# Patient Record
Sex: Female | Born: 1960
Health system: Southern US, Community
[De-identification: ages and names within clinical notes are randomized; demographics above are authoritative.]

## PROBLEM LIST (undated history)

## (undated) DIAGNOSIS — F419 Anxiety disorder, unspecified: Secondary | ICD-10-CM

## (undated) DIAGNOSIS — R109 Unspecified abdominal pain: Secondary | ICD-10-CM

## (undated) DIAGNOSIS — F329 Major depressive disorder, single episode, unspecified: Secondary | ICD-10-CM

## (undated) DIAGNOSIS — M1990 Unspecified osteoarthritis, unspecified site: Secondary | ICD-10-CM

## (undated) DIAGNOSIS — Z8616 Personal history of COVID-19: Secondary | ICD-10-CM

## (undated) DIAGNOSIS — F32A Depression, unspecified: Secondary | ICD-10-CM

## (undated) DIAGNOSIS — G2581 Restless legs syndrome: Secondary | ICD-10-CM

## (undated) DIAGNOSIS — Z973 Presence of spectacles and contact lenses: Secondary | ICD-10-CM

## (undated) DIAGNOSIS — G709 Myoneural disorder, unspecified: Secondary | ICD-10-CM

## (undated) DIAGNOSIS — D509 Iron deficiency anemia, unspecified: Secondary | ICD-10-CM

## (undated) DIAGNOSIS — D259 Leiomyoma of uterus, unspecified: Secondary | ICD-10-CM

## (undated) DIAGNOSIS — T7840XA Allergy, unspecified, initial encounter: Secondary | ICD-10-CM

## (undated) HISTORY — DX: Depression, unspecified: F32.A

## (undated) HISTORY — DX: Leiomyoma of uterus, unspecified: D25.9

## (undated) HISTORY — PX: TONSILLECTOMY AND ADENOIDECTOMY: SUR1326

## (undated) HISTORY — DX: Anxiety disorder, unspecified: F41.9

## (undated) HISTORY — DX: Myoneural disorder, unspecified: G70.9

## (undated) HISTORY — PX: TEAR DUCT PROBING: SHX793

## (undated) HISTORY — PX: HYSTEROSCOPY WITH D & C: SHX1775

## (undated) HISTORY — PX: LASER ABLATION OF THE CERVIX: SHX1949

## (undated) HISTORY — DX: Major depressive disorder, single episode, unspecified: F32.9

## (undated) HISTORY — PX: DILATION AND CURETTAGE OF UTERUS: SHX78

## (undated) HISTORY — PX: INGUINAL HERNIA REPAIR: SUR1180

## (undated) HISTORY — DX: Allergy, unspecified, initial encounter: T78.40XA

---
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
## 1986-05-27 HISTORY — PX: LASER ABLATION OF THE CERVIX: SHX1949

## 1998-01-04 ENCOUNTER — Other Ambulatory Visit: Admission: RE | Admit: 1998-01-04 | Discharge: 1998-01-04 | Payer: Self-pay | Admitting: Obstetrics and Gynecology

## 1999-05-14 ENCOUNTER — Other Ambulatory Visit: Admission: RE | Admit: 1999-05-14 | Discharge: 1999-05-14 | Payer: Self-pay | Admitting: Obstetrics and Gynecology

## 2000-07-15 ENCOUNTER — Other Ambulatory Visit: Admission: RE | Admit: 2000-07-15 | Discharge: 2000-07-15 | Payer: Self-pay | Admitting: Obstetrics and Gynecology

## 2001-09-29 ENCOUNTER — Other Ambulatory Visit: Admission: RE | Admit: 2001-09-29 | Discharge: 2001-09-29 | Payer: Self-pay | Admitting: Obstetrics and Gynecology

## 2001-09-29 ENCOUNTER — Ambulatory Visit (HOSPITAL_COMMUNITY): Admission: RE | Admit: 2001-09-29 | Discharge: 2001-09-29 | Payer: Self-pay | Admitting: Obstetrics and Gynecology

## 2001-09-29 ENCOUNTER — Encounter: Payer: Self-pay | Admitting: Obstetrics and Gynecology

## 2002-06-17 ENCOUNTER — Ambulatory Visit (HOSPITAL_COMMUNITY): Admission: RE | Admit: 2002-06-17 | Discharge: 2002-06-17 | Payer: Self-pay | Admitting: Obstetrics and Gynecology

## 2002-06-17 ENCOUNTER — Encounter (INDEPENDENT_AMBULATORY_CARE_PROVIDER_SITE_OTHER): Payer: Self-pay | Admitting: *Deleted

## 2003-02-08 ENCOUNTER — Other Ambulatory Visit: Admission: RE | Admit: 2003-02-08 | Discharge: 2003-02-08 | Payer: Self-pay | Admitting: Obstetrics and Gynecology

## 2003-08-25 ENCOUNTER — Ambulatory Visit (HOSPITAL_COMMUNITY): Admission: RE | Admit: 2003-08-25 | Discharge: 2003-08-25 | Payer: Self-pay | Admitting: Obstetrics and Gynecology

## 2004-11-08 ENCOUNTER — Ambulatory Visit (HOSPITAL_COMMUNITY): Admission: RE | Admit: 2004-11-08 | Discharge: 2004-11-08 | Payer: Self-pay | Admitting: Obstetrics and Gynecology

## 2005-12-12 ENCOUNTER — Encounter: Payer: Self-pay | Admitting: Family Medicine

## 2005-12-12 LAB — CONVERTED CEMR LAB
Albumin: 3.9 g/dL
Alkaline Phosphatase: 102 units/L
BUN: 13 mg/dL
Chloride: 103 meq/L
Cholesterol: 197 mg/dL
Creatinine, Ser: 0.9 mg/dL
Glucose, Bld: 89 mg/dL
Sodium: 143 meq/L
Total Bilirubin: 0.2 mg/dL
Total Protein: 6.7 g/dL

## 2006-01-28 ENCOUNTER — Ambulatory Visit (HOSPITAL_COMMUNITY): Admission: RE | Admit: 2006-01-28 | Discharge: 2006-01-28 | Payer: Self-pay | Admitting: Obstetrics and Gynecology

## 2007-03-12 ENCOUNTER — Encounter: Admission: RE | Admit: 2007-03-12 | Discharge: 2007-03-12 | Payer: Self-pay | Admitting: Obstetrics and Gynecology

## 2007-07-15 ENCOUNTER — Ambulatory Visit: Payer: Self-pay | Admitting: Family Medicine

## 2007-07-15 DIAGNOSIS — F411 Generalized anxiety disorder: Secondary | ICD-10-CM | POA: Insufficient documentation

## 2007-07-16 ENCOUNTER — Encounter: Payer: Self-pay | Admitting: Family Medicine

## 2007-07-16 LAB — CONVERTED CEMR LAB
ALT: 18 units/L (ref 0–35)
AST: 17 units/L (ref 0–37)
Albumin: 4.3 g/dL (ref 3.5–5.2)
Alkaline Phosphatase: 41 units/L (ref 39–117)
BUN: 20 mg/dL (ref 6–23)
Calcium: 9.2 mg/dL (ref 8.4–10.5)
Cholesterol: 187 mg/dL (ref 0–200)
Glucose, Bld: 102 mg/dL — ABNORMAL HIGH (ref 70–99)
HDL: 52 mg/dL (ref >39)
LDL Cholesterol: 112 mg/dL — ABNORMAL HIGH (ref 0–99)
Potassium: 4.4 meq/L (ref 3.5–5.3)
Total Bilirubin: 0.4 mg/dL (ref 0.3–1.2)
Total Protein: 6.9 g/dL (ref 6.0–8.3)
Triglycerides: 113 mg/dL (ref <150)

## 2007-07-17 ENCOUNTER — Encounter: Payer: Self-pay | Admitting: Family Medicine

## 2007-07-20 ENCOUNTER — Encounter: Payer: Self-pay | Admitting: Family Medicine

## 2007-09-10 ENCOUNTER — Encounter: Payer: Self-pay | Admitting: Family Medicine

## 2007-09-16 ENCOUNTER — Ambulatory Visit: Payer: Self-pay | Admitting: Family Medicine

## 2007-09-16 DIAGNOSIS — R7301 Impaired fasting glucose: Secondary | ICD-10-CM | POA: Insufficient documentation

## 2007-09-16 DIAGNOSIS — J309 Allergic rhinitis, unspecified: Secondary | ICD-10-CM | POA: Insufficient documentation

## 2007-09-16 LAB — CONVERTED CEMR LAB: Blood Glucose, Fasting: 100 mg/dL

## 2008-03-14 ENCOUNTER — Ambulatory Visit: Payer: Self-pay | Admitting: Family Medicine

## 2008-03-14 ENCOUNTER — Ambulatory Visit (HOSPITAL_COMMUNITY): Admission: RE | Admit: 2008-03-14 | Discharge: 2008-03-14 | Payer: Self-pay | Admitting: Obstetrics and Gynecology

## 2008-03-14 LAB — CONVERTED CEMR LAB: Blood Glucose, Fasting: 107 mg/dL

## 2008-07-21 ENCOUNTER — Telehealth: Payer: Self-pay | Admitting: Family Medicine

## 2008-08-17 ENCOUNTER — Ambulatory Visit: Payer: Self-pay | Admitting: Family Medicine

## 2008-08-17 DIAGNOSIS — F322 Major depressive disorder, single episode, severe without psychotic features: Secondary | ICD-10-CM | POA: Insufficient documentation

## 2008-08-17 DIAGNOSIS — F329 Major depressive disorder, single episode, unspecified: Secondary | ICD-10-CM | POA: Insufficient documentation

## 2008-08-17 DIAGNOSIS — G4700 Insomnia, unspecified: Secondary | ICD-10-CM | POA: Insufficient documentation

## 2008-09-13 ENCOUNTER — Telehealth: Payer: Self-pay | Admitting: Family Medicine

## 2008-10-12 ENCOUNTER — Ambulatory Visit (HOSPITAL_COMMUNITY): Payer: Self-pay | Admitting: Psychology

## 2008-11-02 ENCOUNTER — Ambulatory Visit (HOSPITAL_COMMUNITY): Payer: Self-pay | Admitting: Psychology

## 2008-11-16 ENCOUNTER — Ambulatory Visit (HOSPITAL_COMMUNITY): Payer: Self-pay | Admitting: Psychology

## 2008-11-23 ENCOUNTER — Ambulatory Visit (HOSPITAL_COMMUNITY): Payer: Self-pay | Admitting: Psychology

## 2008-11-29 ENCOUNTER — Ambulatory Visit (HOSPITAL_COMMUNITY): Payer: Self-pay | Admitting: Psychology

## 2008-12-08 ENCOUNTER — Ambulatory Visit (HOSPITAL_COMMUNITY): Payer: Self-pay | Admitting: Psychology

## 2008-12-21 ENCOUNTER — Ambulatory Visit (HOSPITAL_COMMUNITY): Payer: Self-pay | Admitting: Psychology

## 2009-01-04 ENCOUNTER — Ambulatory Visit (HOSPITAL_COMMUNITY): Payer: Self-pay | Admitting: Psychology

## 2009-01-12 ENCOUNTER — Telehealth (INDEPENDENT_AMBULATORY_CARE_PROVIDER_SITE_OTHER): Payer: Self-pay | Admitting: *Deleted

## 2009-01-18 ENCOUNTER — Ambulatory Visit: Payer: Self-pay | Admitting: Family Medicine

## 2009-01-18 ENCOUNTER — Ambulatory Visit (HOSPITAL_COMMUNITY): Payer: Self-pay | Admitting: Psychology

## 2009-02-08 ENCOUNTER — Encounter: Payer: Self-pay | Admitting: Family Medicine

## 2009-02-08 ENCOUNTER — Telehealth: Payer: Self-pay | Admitting: Family Medicine

## 2009-02-08 ENCOUNTER — Ambulatory Visit (HOSPITAL_COMMUNITY): Payer: Self-pay | Admitting: Psychology

## 2009-02-08 LAB — CONVERTED CEMR LAB
Creatinine, Ser: 0.9 mg/dL (ref 0.40–1.20)
HDL: 56 mg/dL (ref >39)
LDL Cholesterol: 121 mg/dL — ABNORMAL HIGH (ref 0–99)
TSH: 1.427 microintl units/mL (ref 0.350–4.500)
Total CHOL/HDL Ratio: 3.5
Triglycerides: 88 mg/dL (ref <150)

## 2009-02-13 ENCOUNTER — Telehealth: Payer: Self-pay | Admitting: Family Medicine

## 2009-03-01 ENCOUNTER — Ambulatory Visit (HOSPITAL_COMMUNITY): Payer: Self-pay | Admitting: Psychology

## 2009-03-15 ENCOUNTER — Ambulatory Visit (HOSPITAL_COMMUNITY): Payer: Self-pay | Admitting: Psychology

## 2009-03-22 ENCOUNTER — Ambulatory Visit: Payer: Self-pay | Admitting: Family Medicine

## 2009-03-29 ENCOUNTER — Ambulatory Visit (HOSPITAL_COMMUNITY): Payer: Self-pay | Admitting: Psychology

## 2009-04-12 ENCOUNTER — Ambulatory Visit (HOSPITAL_COMMUNITY): Admission: RE | Admit: 2009-04-12 | Discharge: 2009-04-12 | Payer: Self-pay | Admitting: Obstetrics and Gynecology

## 2009-04-26 ENCOUNTER — Ambulatory Visit (HOSPITAL_COMMUNITY): Payer: Self-pay | Admitting: Psychology

## 2009-05-24 ENCOUNTER — Ambulatory Visit (HOSPITAL_COMMUNITY): Payer: Self-pay | Admitting: Psychology

## 2009-06-21 ENCOUNTER — Ambulatory Visit (HOSPITAL_COMMUNITY): Payer: Self-pay | Admitting: Psychology

## 2009-07-12 ENCOUNTER — Ambulatory Visit (HOSPITAL_COMMUNITY): Payer: Self-pay | Admitting: Psychology

## 2009-08-02 ENCOUNTER — Ambulatory Visit (HOSPITAL_COMMUNITY): Payer: Self-pay | Admitting: Psychology

## 2009-08-23 ENCOUNTER — Ambulatory Visit: Payer: Self-pay | Admitting: Family Medicine

## 2009-08-23 ENCOUNTER — Ambulatory Visit (HOSPITAL_COMMUNITY): Payer: Self-pay | Admitting: Psychology

## 2009-09-20 ENCOUNTER — Ambulatory Visit (HOSPITAL_COMMUNITY): Payer: Self-pay | Admitting: Psychology

## 2009-11-15 ENCOUNTER — Ambulatory Visit (HOSPITAL_COMMUNITY): Payer: Self-pay | Admitting: Psychology

## 2009-12-06 ENCOUNTER — Ambulatory Visit (HOSPITAL_COMMUNITY): Payer: Self-pay | Admitting: Psychology

## 2009-12-13 ENCOUNTER — Ambulatory Visit: Payer: Self-pay | Admitting: Family Medicine

## 2009-12-13 DIAGNOSIS — R209 Unspecified disturbances of skin sensation: Secondary | ICD-10-CM | POA: Insufficient documentation

## 2009-12-13 DIAGNOSIS — R5381 Other malaise: Secondary | ICD-10-CM | POA: Insufficient documentation

## 2009-12-13 DIAGNOSIS — R5383 Other fatigue: Secondary | ICD-10-CM

## 2009-12-15 LAB — CONVERTED CEMR LAB
Basophils Absolute: 0 10*3/uL (ref 0.0–0.1)
CRP: 0.2 mg/dL (ref <0.6)
Eosinophils Absolute: 0.1 10*3/uL (ref 0.0–0.7)
Eosinophils Relative: 2 % (ref 0–5)
Folate: >20 ng/mL
HCT: 43.8 % (ref 36.0–46.0)
Lymphocytes Relative: 39 % (ref 12–46)
MCHC: 31.7 g/dL (ref 30.0–36.0)
MCV: 93.8 fL (ref 78.0–100.0)
Platelets: 238 10*3/uL (ref 150–400)
Sed Rate: 5 mm/hr (ref 0–22)
WBC: 5.4 10*3/uL (ref 4.0–10.5)

## 2009-12-23 ENCOUNTER — Encounter: Admission: RE | Admit: 2009-12-23 | Discharge: 2009-12-23 | Payer: Self-pay | Admitting: Family Medicine

## 2010-01-11 ENCOUNTER — Encounter: Payer: Self-pay | Admitting: Family Medicine

## 2010-01-19 ENCOUNTER — Encounter: Admission: RE | Admit: 2010-01-19 | Discharge: 2010-01-19 | Payer: Self-pay | Admitting: Neurology

## 2010-01-22 ENCOUNTER — Encounter: Payer: Self-pay | Admitting: Family Medicine

## 2010-02-14 ENCOUNTER — Ambulatory Visit (HOSPITAL_COMMUNITY): Payer: Self-pay | Admitting: Psychology

## 2010-02-16 ENCOUNTER — Encounter: Payer: Self-pay | Admitting: Family Medicine

## 2010-04-04 ENCOUNTER — Ambulatory Visit: Payer: Self-pay | Admitting: Family Medicine

## 2010-04-04 ENCOUNTER — Ambulatory Visit (HOSPITAL_COMMUNITY): Payer: Self-pay | Admitting: Psychology

## 2010-04-04 DIAGNOSIS — E559 Vitamin D deficiency, unspecified: Secondary | ICD-10-CM | POA: Insufficient documentation

## 2010-04-06 LAB — CONVERTED CEMR LAB
AST: 18 units/L (ref 0–37)
Alkaline Phosphatase: 43 units/L (ref 39–117)
Calcium: 9.5 mg/dL (ref 8.4–10.5)
Cholesterol: 196 mg/dL (ref 0–200)
Creatinine, Ser: 0.95 mg/dL (ref 0.40–1.20)
Glucose, Bld: 96 mg/dL (ref 70–99)
HDL: 49 mg/dL (ref >39)
LDL Cholesterol: 117 mg/dL — ABNORMAL HIGH (ref 0–99)
Sodium: 142 meq/L (ref 135–145)
Total Protein: 6.9 g/dL (ref 6.0–8.3)
VLDL: 30 mg/dL (ref 0–40)

## 2010-04-13 ENCOUNTER — Encounter: Payer: Self-pay | Admitting: Family Medicine

## 2010-05-02 ENCOUNTER — Ambulatory Visit (HOSPITAL_COMMUNITY)
Admission: RE | Admit: 2010-05-02 | Discharge: 2010-05-02 | Payer: Self-pay | Source: Home / Self Care | Attending: Obstetrics and Gynecology | Admitting: Obstetrics and Gynecology

## 2010-06-26 NOTE — Letter (Signed)
Summary: [facility]  [facility]   Imported By: Lanelle Bal 02/26/2010 10:53:06  _____________________________________________________________________  External Attachment:    Type:   Image     Comment:   External Document

## 2010-06-26 NOTE — Assessment & Plan Note (Signed)
Summary: CPE w/o pap   Vital Signs:  Patient profile:   50 year old female Height:      64 inches Weight:      176 pounds BMI:     30.32 O2 Sat:      97 % on Room air Pulse rate:   68 / minute BP sitting:   128 / 82  (left arm) Cuff size:   large  Vitals Entered By: Payton Spark CMA (April 04, 2010 9:28 AM)  O2 Flow:  Room air CC: CPE w/ fasting labs   Primary Care Provider:  Seymour Bars DO  CC:  CPE w/ fasting labs.  History of Present Illness: 50 yo WF presents for CPE w/o pap smear.    She is seeing Dr Gaetano Net for her neck and poor sleep.  She was neg for RLS and OSA but he is treating her for RLS.  On gabapentin now and it has helped her sleep.  She sees Dr Rosalio Macadamia for her pap smears.  She is on Loestrin and had borderline HTN at her gyn visit.  On OCPs continuously and she is not having a period.  Her mammogram is scheduled in Dec.    Her tetanus is due in 2014.  Got flu shot today. No fam hx of colon cancer. 2007 had a neg cardiolite stress test.  Due for fasting labs today. Moood/ anxiety has improved with Effexor and Gabapentin.     Current Medications (verified): 1)  Loestrin 1/20 (21) 1-20 Mg-Mcg  Tabs (Norethindrone Acet-Ethinyl Est) .... Take 1 Tablet By Mouth Once A Day 2)  Omeprazole 20 Mg  Tbec (Omeprazole) .... Take One By Mouth Every Other Day 3)  Caltrate 600+d 600-400 Mg-Unit  Tabs (Calcium Carbonate-Vitamin D) .... Take 1 Tablet By Mouth Once A Day 4)  Vitamin E 400 Unit  Caps (Vitamin E) .... Take 1 Tablet By Mouth Once A Day 5)  Ascorbic Acid 500 Mg  Tabs (Ascorbic Acid) .... Take 1 Tablet By Mouth Once A Day 6)  Gnp Glucosamine Sulfate 750 Mg  Tabs (Glucosamine Sulfate) .... Take 1 Tablet By Mouth Once A Day 7)  Adult Aspirin Low Strength 81 Mg  Tbdp (Aspirin) .... Take 1 Tablet By Mouth Once A Day 8)  Effexor Xr 150 Mg Xr24h-Cap (Venlafaxine Hcl) .Marland Kitchen.. 1 Tab By Mouth Once Daily 9)  Alprazolam 0.25 Mg Tabs (Alprazolam) .Marland Kitchen.. 1 Tab By Mouth  Two Times A Day As Needed Anxiety 10)  Estradiol 1 Mg Tabs (Estradiol) .... Take 1 Tablet By Mouth Once A Day While Off Lo-Estrin 11)  Qc Womens Daily Multivitamin  Tabs (Multiple Vitamins-Minerals) .... Take 1 Tablet By Mouth Once A Day 12)  Lutein 8 Mg Tabs (Lutein) .... Take 1 Tablet By Mouth Once A Day 13)  Loratadine 10 Mg Tabs (Loratadine) .... Take 1 Tab By Mouth Once Daily 14)  Feosol 45 Mg Tabs (Carbonyl Iron) 15)  Gabapentin 300 Mg Caps (Gabapentin) .... Take 1 Cap Every Morning and 3 Caps Every Evening  Allergies (verified): 1)  ! Prozac 2)  ! * Robitussin Dm  Past History:  Past Medical History: Reviewed history from 08/23/2009 and no changes required. GERD Anxiety G0 irreg menses heart murmur  2D echo normal 12-07 with neg cardiolyte stress cards: Dr Tresa Endo gyn: Dr Rosalio Macadamia  Past Surgical History: Reviewed history from 07/15/2007 and no changes required. bilat inguinal hernia repair 1964 Tear duct probe 1968 Conization of cervix 1989 D&C 1997, 2001  Family  History: Reviewed history from 09/16/2007 and no changes required. mother alive, uterine cancer at 23. father alive, AMI/CABG in 60's, Type II DM 3 brothers with HTN, high chol, depression sister asthma  Social History: Reviewed history from 07/15/2007 and no changes required. 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   No kids. 2 dogs, 2 cats. Never smoked. occas ETOH.  No regular exercise.  Review of Systems       The patient complains of weight gain.  The patient denies anorexia, fever, weight loss, vision loss, decreased hearing, hoarseness, chest pain, syncope, dyspnea on exertion, peripheral edema, prolonged cough, headaches, hemoptysis, abdominal pain, melena, hematochezia, severe indigestion/heartburn, hematuria, incontinence, genital sores, muscle weakness, suspicious skin lesions, transient blindness, difficulty walking, depression, unusual weight change,  abnormal bleeding, enlarged lymph nodes, angioedema, breast masses, and testicular masses.    Physical Exam  General:  alert, well-developed, well-nourished, well-hydrated, and overweight-appearing.   Head:  normocephalic and atraumatic.   Eyes:  wears glasses Ears:  EACs patent; TMs translucent and gray with good cone of light and bony landmarks.  Nose:  no nasal discharge.   Mouth:  good dentition and pharynx pink and moist.   Neck:  no masses.   Lungs:  Normal respiratory effort, chest expands symmetrically. Lungs are clear to auscultation, no crackles or wheezes. Heart:  Normal rate and regular rhythm. S1 and S2 normal without gallop, murmur, click, rub or other extra sounds. Abdomen:  Bowel sounds positive,abdomen soft and non-tender without masses, organomegaly, no AA bruits Pulses:  2+ radial and pedal pulses Extremities:  no LE edema Neurologic:  +3/4 patellar reflexes (hyperreflexic) Skin:  color normal.   Cervical Nodes:  No lymphadenopathy noted Psych:  good eye contact, not anxious appearing, and not depressed appearing.     Impression & Recommendations:  Problem # 1:  HEALTH MAINTENANCE EXAM (ICD-V70.0) Keeping healthy checklist for women. BP at goal.  BMI 30 c/w class I obesity. Mammogram and pap UTD per Dr Rosalio Macadamia. Counseled on healthy diet, regular exercise, wt loss. See Dr Gaetano Net for neurologic complaints. Update fasting labs and flu shot today. RTC mood in 4 mos.  Complete Medication List: 1)  Loestrin 1/20 (21) 1-20 Mg-mcg Tabs (Norethindrone acet-ethinyl est) .... Take 1 tablet by mouth once a day 2)  Omeprazole 20 Mg Tbec (Omeprazole) .... Take one by mouth every other day 3)  Caltrate 600+d 600-400 Mg-unit Tabs (Calcium carbonate-vitamin d) .... Take 1 tablet by mouth once a day 4)  Ascorbic Acid 500 Mg Tabs (Ascorbic acid) .... Take 1 tablet by mouth once a day 5)  Gnp Glucosamine Sulfate 750 Mg Tabs (Glucosamine sulfate) .... Take 1 tablet by mouth  once a day 6)  Adult Aspirin Low Strength 81 Mg Tbdp (Aspirin) .... Take 1 tablet by mouth once a day 7)  Effexor Xr 150 Mg Xr24h-cap (Venlafaxine hcl) .Marland Kitchen.. 1 tab by mouth once daily 8)  Alprazolam 0.25 Mg Tabs (Alprazolam) .Marland Kitchen.. 1 tab by mouth two times a day as needed anxiety 9)  Estradiol 1 Mg Tabs (Estradiol) .... Take 1 tablet by mouth once a day while off lo-estrin 10)  Qc Womens Daily Multivitamin Tabs (Multiple vitamins-minerals) .... Take 1 tablet by mouth once a day 11)  Lutein 8 Mg Tabs (Lutein) .... Take 1 tablet by mouth once a day 12)  Loratadine 10 Mg Tabs (Loratadine) .... Take 1 tab by mouth once daily 13)  Feosol 45 Mg Tabs (Carbonyl iron) 14)  Gabapentin 300 Mg Caps (Gabapentin) .... Take 1 cap every morning and 3 caps every evening  Other Orders: Admin 1st Vaccine (16109) Flu Vaccine 65yrs + [phone]) T-Vitamin D (25-Hydroxy) [phone]) T-Comprehensive Metabolic Panel [phone]) T-Lipid Profile [phone])   Orders Added: 1)  Admin 1st Vaccine [90471] 2)  Flu Vaccine 37yrs + [28413] 3)  T-Vitamin D (25-Hydroxy) [24401-02725] 4)  T-Comprehensive Metabolic Panel [[id number]] 5)  T-Lipid Profile [[id number]] 6)  Est. Patient age 44-64 [52] Flu Vaccine Consent Questions     Do you have a history of severe allergic reactions to this vaccine? no    Any prior history of allergic reactions to egg and/or gelatin? no    Do you have a sensitivity to the preservative Thimersol? no    Do you have a past history of Guillan-Barre Syndrome? no    Do you currently have an acute febrile illness? no    Have you ever had a severe reaction to latex? no    Vaccine information given and explained to patient? yes    Are you currently pregnant? no    Lot Number:[id number]   Exp Date:11/24/2010   Site Given  Left Deltoid IM Added: 1)  Admin 1st Vaccine [90471] 2)  Flu Vaccine 65yrs + [36644]     .lbflu

## 2010-06-26 NOTE — Consult Note (Signed)
Summary: [facility]  [facility]   Imported By: Lanelle Bal 01/17/2010 10:47:26  _____________________________________________________________________  External Attachment:    Type:   Image     Comment:   External Document

## 2010-06-26 NOTE — Assessment & Plan Note (Signed)
Summary: paresthesias/ fatigue   Vital Signs:  Patient profile:   50 year old female Height:      64 inches Weight:      173 pounds BMI:     29.80 O2 Sat:      98 % on Room air Pulse rate:   70 / minute BP sitting:   140 / 94  (left arm) Cuff size:   large  Vitals Entered By: Payton Spark CMA (December 13, 2009 10:46 AM)  O2 Flow:  Room air CC: Feels skin crawling x 1 month. Also c/o major fatigue x 1 month.   Primary Care Provider:  Seymour Bars DO  CC:  Feels skin crawling x 1 month. Also c/o major fatigue x 1 month..  History of Present Illness: 50 yo WF presents for tinlging on both arms and sides of her face x 1 month.  Denies any pain.  She has been under a lot of stress.  She is taking Effexor XR 150 mg/ day.  Rarely using her Alprazolam.  She continues to complain of fatigue.  She has a fairly health diet.  She is getting some exercise.  Denies weakness, chest pain or SOB with exercise.  She denies dragging of her legs.  Also has intermittent paresthesias in both of her legs.  Denies neck or back pain.    She c/o some blurry vision, urinary leakage, a hx of fecal incontinence.      Current Medications (verified): 1)  Loestrin 1/20 (21) 1-20 Mg-Mcg  Tabs (Norethindrone Acet-Ethinyl Est) .... Take 1 Tablet By Mouth Once A Day 2)  Omeprazole 20 Mg  Tbec (Omeprazole) .... Take One By Mouth Every Other Day 3)  Propranolol Hcl 20 Mg  Tabs (Propranolol Hcl) .... Take 1 Tablet By Mouth Once A Day As Needed 4)  Caltrate 600+d 600-400 Mg-Unit  Tabs (Calcium Carbonate-Vitamin D) .... Take 1 Tablet By Mouth Once A Day 5)  Vitamin E 400 Unit  Caps (Vitamin E) .... Take 1 Tablet By Mouth Once A Day 6)  Ascorbic Acid 500 Mg  Tabs (Ascorbic Acid) .... Take 1 Tablet By Mouth Once A Day 7)  Gnp Glucosamine Sulfate 750 Mg  Tabs (Glucosamine Sulfate) .... Take 1 Tablet By Mouth Once A Day 8)  Adult Aspirin Low Strength 81 Mg  Tbdp (Aspirin) .... Take 1 Tablet By Mouth Once A Day 9)   Effexor Xr 150 Mg Xr24h-Cap (Venlafaxine Hcl) .Marland Kitchen.. 1 Tab By Mouth Once Daily 10)  Alprazolam 0.25 Mg Tabs (Alprazolam) .... 1/2-1 By Mouth Daily As Needed Anxiety 11)  Estradiol 1 Mg Tabs (Estradiol) .... Take 1 Tablet By Mouth Once A Day While Off Lo-Estrin 12)  Qc Womens Daily Multivitamin  Tabs (Multiple Vitamins-Minerals) .... Take 1 Tablet By Mouth Once A Day 13)  Lutein 8 Mg Tabs (Lutein) .... Take 1 Tablet By Mouth Once A Day 14)  Loratadine 10 Mg Tabs (Loratadine) .... Take 1 Tab By Mouth Once Daily  Allergies (verified): 1)  ! Prozac 2)  ! * Robitussin Dm  Past History:  Past Medical History: Reviewed history from 08/23/2009 and no changes required. GERD Anxiety G0 irreg menses heart murmur  2D echo normal 12-07 with neg cardiolyte stress cards: Dr Tresa Endo gyn: Dr Rosalio Macadamia  Past Surgical History: Reviewed history from 07/15/2007 and no changes required. bilat inguinal hernia repair 1964 Tear duct probe 1968 Conization of cervix 1989 D&C 1997, 2001  Family History: Reviewed history from 09/16/2007 and no changes required. mother  alive, uterine cancer at 32. father alive, AMI/CABG in 16's, Type II DM 3 brothers with HTN, high chol, depression sister asthma  Social History: Reviewed history from 07/15/2007 and no changes required. 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   No kids. 2 dogs, 2 cats. Never smoked. occas ETOH.  No regular exercise.  Review of Systems      See HPI  Physical Exam  General:  alert, well-developed, well-nourished, well-hydrated, and overweight-appearing.   Head:  normocephalic and atraumatic.   Eyes:  pupils equal, pupils round, and pupils reactive to light.   Nose:  no nasal discharge.   Mouth:  pharynx pink and moist.   Neck:  no masses.   Lungs:  Normal respiratory effort, chest expands symmetrically. Lungs are clear to auscultation, no crackles or wheezes. Heart:  Normal rate and  regular rhythm. S1 and S2 normal without gallop, murmur, click, rub or other extra sounds. Msk:  no joint tenderness, no joint swelling, and no joint warmth.   Pulses:  2+ radial pulses Extremities:  No clubbing, cyanosis, edema, or deformity noted  Neurologic:  alert & oriented X3, cranial nerves II-XII intact, strength normal in all extremities, sensation intact to light touch, and gait normal.  hyperreflexic patellar DTRs bilat   Impression & Recommendations:  Problem # 1:  FATIGUE, ACUTE (ICD-780.79) Will check labs today to look for iron deficiency, thyroid d/o, metabolic problem, anemia, infection, autoimmune condition etc.  F/U results this wk.  If everything is normal and cancer screening is UTD, will look more closely at her depression treatment. Orders: T-Sed Rate (Automated) [phone]) T-C-Reactive Protein [id number]) T-CBC w/Diff [phone]) T-TSH [phone]) T-MRI Head w/o contrast (10626)  Problem # 2:  PARESTHESIA (ICD-782.0) Diffuse - both arms, legs, face x months with subjective weakness.  Will screen for B12 and Folate deficiency.  Given other constellation of symptoms like fatigue, blurry vision and subjective weakness, will get MRI brain to r/o MS. Orders: T-Vitamin B12 ([id number]) T-Folate (50093) T-MRI Head w/o contrast (81829)  Problem # 3:  DEPRESSION (ICD-311)  Her updated medication list for this problem includes:    Effexor Xr 150 Mg Xr24h-cap (Venlafaxine hcl) .Marland Kitchen... 1 tab by mouth once daily    Alprazolam 0.25 Mg Tabs (Alprazolam) .Marland Kitchen... 1 tab by mouth two times a day as needed anxiety  Complete Medication List: 1)  Loestrin 1/20 (21) 1-20 Mg-mcg Tabs (Norethindrone acet-ethinyl est) .... Take 1 tablet by mouth once a day 2)  Omeprazole 20 Mg Tbec (Omeprazole) .... Take one by mouth every other day 3)  Propranolol Hcl 20 Mg Tabs (Propranolol hcl) .... Take 1 tablet by mouth once a day as needed 4)  Caltrate 600+d 600-400 Mg-unit Tabs (Calcium  carbonate-vitamin d) .... Take 1 tablet by mouth once a day 5)  Vitamin E 400 Unit Caps (Vitamin e) .... Take 1 tablet by mouth once a day 6)  Ascorbic Acid 500 Mg Tabs (Ascorbic acid) .... Take 1 tablet by mouth once a day 7)  Gnp Glucosamine Sulfate 750 Mg Tabs (Glucosamine sulfate) .... Take 1 tablet by mouth once a day 8)  Adult Aspirin Low Strength 81 Mg Tbdp (Aspirin) .... Take 1 tablet by mouth once a day 9)  Effexor Xr 150 Mg Xr24h-cap (Venlafaxine hcl) .Marland Kitchen.. 1 tab by mouth once daily 10)  Alprazolam 0.25 Mg Tabs (Alprazolam) .Marland Kitchen.. 1 tab by mouth two times a day as needed anxiety 11)  Estradiol 1 Mg Tabs (Estradiol) .Marland KitchenMarland KitchenMarland Kitchen  Take 1 tablet by mouth once a day while off lo-estrin 12)  Qc Womens Daily Multivitamin Tabs (Multiple vitamins-minerals) .... Take 1 tablet by mouth once a day 13)  Lutein 8 Mg Tabs (Lutein) .... Take 1 tablet by mouth once a day 14)  Loratadine 10 Mg Tabs (Loratadine) .... Take 1 tab by mouth once daily  Patient Instructions: 1)  Labs today. 2)  Will call you w/ results tomorrow. 3)  Will schedule MRI brain for next weekend. 4)  Stay on Effexor. 5)  Use Alprazolam 1 tab two times a day for anxiety. 6)  Schedule eye exam re: change in vision.

## 2010-06-26 NOTE — Assessment & Plan Note (Signed)
Summary: f/u depression   Vital Signs:  Patient profile:   50 year old female Height:      64 inches Weight:      170 pounds BMI:     29.29 O2 Sat:      98 % on Room air Pulse rate:   71 / minute BP sitting:   125 / 86  (left arm) Cuff size:   regular  Vitals Entered By: Payton Spark CMA (August 23, 2009 3:24 PM)  O2 Flow:  Room air CC: F/U mood and meds   Primary Care Provider:  Seymour Bars DO  CC:  F/U mood and meds.  History of Present Illness: 50 yo WF presents for f/u depression.  She just separated from her husband after being married for 17 yrs.  She is adjusting to her new life.  She is seeing the counselor downstairs which has helped.  She has lost 8 lbs.  She has some anxiety but is rarely using Xanax.  She has improved on Effexor XR branded 150 mg/ day.  Her work has been supporting.   She has her dogs back so she is happy about that.  She plans to start exercising more.  She has moved to [place]   Her family is in [place] and [place].  Her parents go b/w [place] and [place].    Allergies (verified): 1)  ! Prozac 2)  ! * Robitussin Dm  Past History:  Past Medical History: GERD Anxiety G0 irreg menses heart murmur  2D echo normal 12-07 with neg cardiolyte stress cards: Dr Tresa Endo gyn: Dr Rosalio Macadamia  Review of Systems Psych:  Denies anxiety, depression, easily tearful, irritability, panic attacks, suicidal thoughts/plans, and thoughts of violence.  Physical Exam  General:  alert, well-developed, well-nourished, and well-hydrated.   Head:  normocephalic and atraumatic.   Mouth:  good dentition and pharynx pink and moist.   Neck:  no masses.   Lungs:  Normal respiratory effort, chest expands symmetrically. Lungs are clear to auscultation, no crackles or wheezes. Heart:  Normal rate and regular rhythm. S1 and S2 normal without gallop, murmur, click, rub or other extra sounds. Extremities:  no LE edema Neurologic:  no tremor Skin:  color normal.   Psych:   good eye contact, not anxious appearing, and not depressed appearing.     Impression & Recommendations:  Problem # 1:  DEPRESSION (ICD-311) Assessment Improved Continue current meds + counseling.  She has just separated from her husband and dealing with new life changes.   Meds RFd.  RTC in 6 mos. Her updated medication list for this problem includes:    Effexor Xr 150 Mg Xr24h-cap (Venlafaxine hcl) .Marland Kitchen... 1 tab by mouth once daily    Alprazolam 0.25 Mg Tabs (Alprazolam) .Marland Kitchen... 1/2-1 by mouth daily as needed anxiety  Complete Medication List: 1)  Loestrin 1/20 (21) 1-20 Mg-mcg Tabs (Norethindrone acet-ethinyl est) .... Take 1 tablet by mouth once a day 2)  Omeprazole 20 Mg Tbec (Omeprazole) .... Take one by mouth every other day 3)  Propranolol Hcl 20 Mg Tabs (Propranolol hcl) .... Take 1 tablet by mouth once a day as needed 4)  Caltrate 600+d 600-400 Mg-unit Tabs (Calcium carbonate-vitamin d) .... Take 1 tablet by mouth once a day 5)  Vitamin E 400 Unit Caps (Vitamin e) .... Take 1 tablet by mouth once a day 6)  Ascorbic Acid 500 Mg Tabs (Ascorbic acid) .... Take 1 tablet by mouth once a day 7)  Gnp  Glucosamine Sulfate 750 Mg Tabs (Glucosamine sulfate) .... Take 1 tablet by mouth once a day 8)  Adult Aspirin Low Strength 81 Mg Tbdp (Aspirin) .... Take 1 tablet by mouth once a day 9)  Effexor Xr 150 Mg Xr24h-cap (Venlafaxine hcl) .Marland Kitchen.. 1 tab by mouth once daily 10)  Alprazolam 0.25 Mg Tabs (Alprazolam) .... 1/2-1 by mouth daily as needed anxiety 11)  Estradiol 1 Mg Tabs (Estradiol) .... Take 1 tablet by mouth once a day while off lo-estrin 12)  Qc Womens Daily Multivitamin Tabs (Multiple vitamins-minerals) .... Take 1 tablet by mouth once a day 13)  Lutein 8 Mg Tabs (Lutein) .... Take 1 tablet by mouth once a day  Patient Instructions: 1)  Return for f/u in 6 mos with fasting labs.   Prescriptions: EFFEXOR XR 150 MG XR24H-CAP (VENLAFAXINE HCL) 1 tab by mouth once daily Brand medically  necessary #30 x 5   Entered and Authorized by:   Seymour Bars DO   Signed by:   Seymour Bars DO on 08/23/2009   Method used:   Electronically to        [facility]  [address] (retail)       [address]       [place], [place]  [address]       Ph: [phone] or [phone]       Fax: [phone]   RxID:   [id number]

## 2010-06-28 NOTE — Letter (Signed)
Summary: [facility]  [facility]   Imported By: Lanelle Bal 05/15/2010 09:29:33  _____________________________________________________________________  External Attachment:    Type:   Image     Comment:   External Document

## 2010-06-28 NOTE — Letter (Signed)
Summary: [facility]  [facility]   Imported By: Lanelle Bal 05/15/2010 09:20:58  _____________________________________________________________________  External Attachment:    Type:   Image     Comment:   External Document

## 2010-07-06 ENCOUNTER — Encounter: Payer: Self-pay | Admitting: Family Medicine

## 2010-10-12 NOTE — Op Note (Signed)
NAME:  Elizabeth [name], Elizabeth [name]                       ACCOUNT NO.:  [id number]   MEDICAL RECORD NO.:  [id number]                   PATIENT TYPE:  AMB   LOCATION:  SDC                                  FACILITY:  [facility]   PHYSICIAN:  Sherry A. Rosalio Macadamia, M.D.           DATE OF BIRTH:  1960/08/02   DATE OF PROCEDURE:  06/17/2002  DATE OF DISCHARGE:                                 OPERATIVE REPORT   PREOPERATIVE DIAGNOSES:  1. Irregular bleeding.  2. Endocervical mass.   POSTOPERATIVE DIAGNOSES:  1. Irregular bleeding.  2. Endocervical mass.  3. Friable cervix.   PROCEDURE:  Dilatation and curettage, hysteroscopy with resectoscope.   SURGEON:  Sherry A. Rosalio Macadamia, M.D.   ANESTHESIA:  MAC.   COMPLICATIONS:  None.   INDICATIONS:  This is a 50 year old G0, P0 woman who has had a long history  of  irregular bleeding.  The patient had a previous D&C hysteroscopy with  removal of tissue from the endometrium and endocervix in August 1999.  Initially after that her bleeding improved; however, approximately a year  ago she started with very irregular bleeding once again.  The patient  changed birth control pills several times.  The patient has tried Ortho Evra  patches; however, she was allergic to the adhesive.  Ultrasound was re-  performed showing a possible endocervical polyp.  Sonohysterogram was  offered to the patient, but the patient requested D&C hysteroscopy instead.  Therefore, the patient is brought to the operating room for [facility]  hysteroscopy.   FINDINGS:  Normal-sized anteflexed uterus.  No adnexal mass.  Small  endometrial cavity.  Normal cornual openings.  Slightly irregular  endocervical cavity.  Friable cervix with possible small laceration.   DESCRIPTION OF PROCEDURE:  The patient is brought into the operating room  and given adequate IV sedation.  She was placed in a dorsal lithotomy  position.  Her perineum was washed with Hibiclens.  Pelvic examination was  performed.   The patient was draped in a sterile fashion.  A speculum was  placed within the vagina.  The vagina was washed with Hibiclens.  In just  washing the cervix with Hibiclens, bleeding occurred from the cervix.  It  appeared that there may be a small laceration that was easily opened just by  normal washing.  A picture was obtained.  Paracervical block was then  administered with 1% Nesacaine.  The anterior lip of the cervix was grasped  with a single-tooth tenaculum.  The cervix was sounded.  The cervix was  dilated with Pratt dilators to a #31.  The hysteroscope was introduced in  the endometrial cavity, pictures were obtained.  Using a double loop right  angle resector, samples were taken circumferentially around the endometrial  cavity.  The endocervix was visualized.  There was a slight irregularity to  the endocervix, but it seemed to be within normal findings.  Resections of  the slightly irregular endocervical  tissue were obtained.  Small cautery was  performed for any bleeders.  The instruments were removed from the uterus  and cervix.  There was still some bleeding from the cervical tissue.  This  was initially stopped with Monsel's solution but some bleeding continued;  therefore, it was then cauterized.  All adequate hemostasis was present.  All instruments were removed from the vagina.  The patient was taken out of  the dorsal lithotomy position.  She was awakened.  She was moved from the  operating room to a stretcher in stable condition.  Estimated blood loss  less than 5 cubic centimeters.  Sorbitol differential -140.                                                Sherry A. Rosalio Macadamia, M.D.    SAD/MEDQ  D:  06/17/2002  T:  06/17/2002  Job:  [id number]

## 2010-10-29 ENCOUNTER — Other Ambulatory Visit: Payer: Self-pay | Admitting: Family Medicine

## 2011-03-25 ENCOUNTER — Emergency Department: Payer: Self-pay | Admitting: Emergency Medicine

## 2011-03-30 ENCOUNTER — Emergency Department: Payer: Self-pay | Admitting: Emergency Medicine

## 2011-05-01 ENCOUNTER — Other Ambulatory Visit: Payer: Self-pay | Admitting: *Deleted

## 2011-05-01 MED ORDER — EFFEXOR XR 150 MG PO CP24: 150.0000 mg | ORAL_CAPSULE | Freq: Every day | ORAL | Status: DC

## 2011-05-15 ENCOUNTER — Encounter: Payer: Self-pay | Admitting: Family Medicine

## 2011-05-15 ENCOUNTER — Ambulatory Visit (INDEPENDENT_AMBULATORY_CARE_PROVIDER_SITE_OTHER): Payer: 59 | Admitting: Family Medicine

## 2011-05-15 VITALS — BP 118/72 | HR 64 | Wt 170.0 lb

## 2011-05-15 DIAGNOSIS — G2581 Restless legs syndrome: Secondary | ICD-10-CM

## 2011-05-15 DIAGNOSIS — F39 Unspecified mood [affective] disorder: Secondary | ICD-10-CM

## 2011-05-15 DIAGNOSIS — Z13220 Encounter for screening for lipoid disorders: Secondary | ICD-10-CM

## 2011-05-15 LAB — COMPLETE METABOLIC PANEL WITH GFR
ALT: 16 U/L (ref 0–35)
Albumin: 4.3 g/dL (ref 3.5–5.2)
CO2: 26 mEq/L (ref 19–32)
Chloride: 107 mEq/L (ref 96–112)
Creat: 0.9 mg/dL (ref 0.50–1.10)
GFR, Est African American: 87 mL/min
Potassium: 4.9 mEq/L (ref 3.5–5.3)

## 2011-05-15 LAB — LIPID PANEL
Cholesterol: 188 mg/dL (ref 0–200)
LDL Cholesterol: 107 mg/dL — ABNORMAL HIGH (ref 0–99)
Total CHOL/HDL Ratio: 3.4 Ratio
Triglycerides: 128 mg/dL (ref <150)
VLDL: 26 mg/dL (ref 0–40)

## 2011-05-15 NOTE — Progress Notes (Signed)
  Subjective:    Patient ID: Elizabeth [name], female    DOB: [birth year], 50 y.o.   MRN: [id number]  HPI Here to f/u anxiety and depression. She is going through a divorced. Signed papers tody.  Seeing Dr. Orie Rout has been treating hger for RLS. Went to counseling at one time but no longer. Was seeing Elray Buba. She is happy with her curret regimen. Plan is to wean off in the spring.  No SE on the medication.    Review of Systems     Objective:   Physical Exam  Constitutional: She is oriented to person, place, and time. She appears well-developed and well-nourished.  Cardiovascular: Normal rate, regular rhythm and normal heart sounds.   Pulmonary/Chest: Effort normal and breath sounds normal.  Neurological: She is alert and oriented to person, place, and time.  Skin: Skin is warm and dry.  Psychiatric: She has a normal mood and affect. Her behavior is normal.          Assessment & Plan:  Anxiety/Depression - GAD- 7 score of 3 and PHQ-9 score of 8, continue current dose.  Then followup in the spring if interested in weaning. O/W f/u in 6 mo.   Flu shot is up to date. Pap is up to date.   Due to recheck cholesterol. LDL was borderline a year ago.

## 2011-07-15 ENCOUNTER — Encounter: Payer: Self-pay | Admitting: Family Medicine

## 2011-07-15 DIAGNOSIS — R0683 Snoring: Secondary | ICD-10-CM | POA: Insufficient documentation

## 2011-08-07 ENCOUNTER — Ambulatory Visit (INDEPENDENT_AMBULATORY_CARE_PROVIDER_SITE_OTHER): Payer: 59 | Admitting: Family Medicine

## 2011-08-07 ENCOUNTER — Encounter: Payer: Self-pay | Admitting: Family Medicine

## 2011-08-07 VITALS — BP 114/71 | HR 110 | Ht 64.0 in | Wt 173.0 lb

## 2011-08-07 DIAGNOSIS — Z23 Encounter for immunization: Secondary | ICD-10-CM

## 2011-08-07 DIAGNOSIS — Z0000 Encounter for general adult medical examination without abnormal findings: Secondary | ICD-10-CM

## 2011-08-07 MED ORDER — EFFEXOR XR 75 MG PO CP24: 75.0000 mg | ORAL_CAPSULE | Freq: Every day | ORAL | Status: DC

## 2011-08-07 MED ORDER — ALPRAZOLAM 0.25 MG PO TABS: 0.2500 mg | ORAL_TABLET | Freq: Every day | ORAL | Status: DC | PRN

## 2011-08-07 NOTE — Patient Instructions (Signed)
Start a regular exercise program and make sure you are eating a healthy diet Try to eat 4 servings of dairy a day or take a calcium supplement (500mg twice a day). Your vaccines are up to date.   

## 2011-08-07 NOTE — Progress Notes (Signed)
  Subjective:     Elizabeth [name] is a 51 y.o. female and is here for a comprehensive physical exam. The patient reports no problems.Her divorce will be final in a few weeks.   History   Social History  . Marital Status: Legally Separated    Spouse Name: N/A    Number of Children: 0  . Years of Education: N/A   Occupational History  . VETERINARIAN    Social History Main Topics  . Smoking status: Never Smoker   . Smokeless tobacco: Not on file  . Alcohol Use: Yes     Rarely.   . Drug Use: No  . Sexually Active: Not Currently   Other Topics Concern  . Not on file   Social History Narrative   No regular exercise. No pregnancise.     Health Maintenance  Topic Date Due  . Influenza Vaccine  02/25/2012  . Tetanus/tdap  09/26/2012  . Pap Smear  12/25/2013    The following portions of the patient's history were reviewed and updated as appropriate: allergies, current medications, past family history, past medical history, past social history, past surgical history and problem list.  Review of Systems A comprehensive review of systems was negative.   Objective:    BP 114/71  Pulse 110  Ht 5\' 4"  (1.626 m)  Wt 173 lb (78.472 kg)  BMI 29.70 kg/m2 General appearance: alert, cooperative and appears stated age Head: Normocephalic, without obvious abnormality, atraumatic Eyes: conj clear, EOMi, PEERLA.  Ears: normal TM's and external ear canals both ears Nose: Nares normal. Septum midline. Mucosa normal. No drainage or sinus tenderness. Throat: lips, mucosa, and tongue normal; teeth and gums normal Neck: no adenopathy, no carotid bruit, no JVD, supple, symmetrical, trachea midline and thyroid not enlarged, symmetric, no tenderness/mass/nodules Back: symmetric, no curvature. ROM normal. No CVA tenderness. Lungs: clear to auscultation bilaterally Heart: regular rate and rhythm, S1, S2 normal, no murmur, click, rub or gallop Abdomen: soft, non-tender; bowel sounds normal; no  masses,  no organomegaly Extremities: extremities normal, atraumatic, no cyanosis or edema Pulses: 2+ and symmetric Skin: Skin color, texture, turgor normal. No rashes or lesions Lymph nodes: Cervical, supraclavicular, and axillary nodes normal. Neurologic: Alert and oriented X 3, normal strength and tone. Normal symmetric reflexes. Normal coordination and gait    Assessment:    Healthy female exam.      Plan:     See After Visit Summary for Counseling Recommendations  Start a regular exercise program and make sure you are eating a healthy diet Try to eat 4 servings of dairy a day or take a calcium supplement (500mg  twice a day). Your vaccines are up to date.  Tdap given today.   Depression/anxiety-she would like to slowly wean off of her Effexor. Thus we will decrease her to 75 mg release tablet. 90 day supply sent. She says she can alternate with the 150s for a few weeks and then try and go down to 75 daily.  Note she does see GYN for her pelvic and breast exam.

## 2011-08-08 ENCOUNTER — Other Ambulatory Visit: Payer: Self-pay | Admitting: *Deleted

## 2011-08-08 MED ORDER — VENLAFAXINE HCL ER 150 MG PO CP24: 150.0000 mg | ORAL_CAPSULE | Freq: Every day | ORAL | Status: DC

## 2011-08-12 ENCOUNTER — Telehealth: Payer: Self-pay | Admitting: *Deleted

## 2011-08-12 NOTE — Telephone Encounter (Signed)
He states that they gave her brand last time. States that with her getting the generic her copay for 3 months will go down from $150 to $25.

## 2011-08-12 NOTE — Telephone Encounter (Signed)
When due for refill we can resend a generic. She will have to call her thought instead of [facility] so we can make sure it is correct.

## 2011-08-12 NOTE — Telephone Encounter (Signed)
They have called to see if you think pt would benefit from take the generic for Effexxor. Please advise.

## 2011-08-12 NOTE — Telephone Encounter (Signed)
She can definitely take the generic. Did the pharm give her brand?

## 2011-09-09 ENCOUNTER — Other Ambulatory Visit: Payer: Self-pay | Admitting: *Deleted

## 2011-09-09 MED ORDER — EFFEXOR XR 75 MG PO CP24
75.0000 mg | ORAL_CAPSULE | Freq: Every day | ORAL | Status: DC
Start: 2011-09-09 — End: 2011-11-05

## 2011-10-28 ENCOUNTER — Other Ambulatory Visit (HOSPITAL_COMMUNITY): Payer: Self-pay | Admitting: Obstetrics and Gynecology

## 2011-10-28 DIAGNOSIS — Z1231 Encounter for screening mammogram for malignant neoplasm of breast: Secondary | ICD-10-CM

## 2011-11-05 ENCOUNTER — Telehealth: Payer: Self-pay | Admitting: *Deleted

## 2011-11-05 MED ORDER — VENLAFAXINE HCL ER 37.5 MG PO CP24: 37.5000 mg | ORAL_CAPSULE | Freq: Every day | ORAL | Status: DC

## 2011-11-05 NOTE — Telephone Encounter (Signed)
LMOM rx has been sent to pharmacy.

## 2011-11-05 NOTE — Telephone Encounter (Signed)
OK, sent rx. Let me know if has any problems with tapering it the way she wants to.

## 2011-11-05 NOTE — Telephone Encounter (Signed)
Pt is requesting an rx for Effexor 37.5mg  disp. 45 be sent to [facility]. States she is weaning the effexor and that she wants to be able to take 1 tab one day and then 2 tabs next day and alternate this way. States she knows her body better than we do and that she has done this before. Please advise.

## 2011-11-20 ENCOUNTER — Ambulatory Visit (HOSPITAL_COMMUNITY)
Admission: RE | Admit: 2011-11-20 | Discharge: 2011-11-20 | Disposition: A | Discharge status: Home / Self Care | Payer: 59 | Source: Ambulatory Visit | Attending: Obstetrics and Gynecology | Admitting: Obstetrics and Gynecology

## 2011-11-20 DIAGNOSIS — Z1231 Encounter for screening mammogram for malignant neoplasm of breast: Secondary | ICD-10-CM

## 2012-05-13 ENCOUNTER — Encounter: Payer: Self-pay | Admitting: Family Medicine

## 2012-05-25 ENCOUNTER — Encounter: Payer: Self-pay | Admitting: Family Medicine

## 2012-05-25 ENCOUNTER — Ambulatory Visit (INDEPENDENT_AMBULATORY_CARE_PROVIDER_SITE_OTHER): Payer: 59 | Admitting: Family Medicine

## 2012-05-25 VITALS — BP 147/79 | HR 58 | Resp 16 | Wt 161.0 lb

## 2012-05-25 DIAGNOSIS — F430 Acute stress reaction: Secondary | ICD-10-CM

## 2012-05-25 MED ORDER — BUPROPION HCL ER (XL) 150 MG PO TB24: 150.0000 mg | ORAL_TABLET | Freq: Every day | ORAL | Status: DC

## 2012-05-25 NOTE — Progress Notes (Signed)
  Subjective:    Patient ID: Elizabeth [name], female    DOB: [birth year], 51 y.o.   MRN: [id number]  HPI Anxiety- She slowly weaned off the Effexor and was off in July. She said it was probably not. So much so that she does not want to ever be on an SSRI again if possible. She is doing well.  She will be losing her job in March. She has had one panic attack since then. She feels she has situational anxiety.  She has some depression sxs but feels they are mild. She has had a few times where feels like she want to crawl out of her skin. She used to use [facility] for counseling and did feel like it was helpful. She was also recently divorced last February has been him as to year..    Review of Systems     Objective:   Physical Exam  Constitutional: She is oriented to person, place, and time. She appears well-developed and well-nourished.  HENT:  Head: Normocephalic and atraumatic.  Cardiovascular: Normal rate, regular rhythm and normal heart sounds.   Pulmonary/Chest: Effort normal and breath sounds normal.  Neurological: She is alert and oriented to person, place, and time.  Skin: Skin is warm and dry.  Psychiatric: She has a normal mood and affect. Her behavior is normal.          Assessment & Plan:  Anxiety - GAD- 7 score of 6.  Discussed restarting a medication vs counseling vs prn benzo. She says she wants to hold off on counseling at this time but will definitely keep in mind. She would like to restart a daily medication. We discussed the possibility of Wellbutrin. Discussed potential side effects of the medication. We will start medication and followup in 4-6 weeks. She can call if she has any side effects or concerns or problems.  Elevated blood pressure-recheck at followup visit. Normally her blood pressure is well-controlled.   Time spent 25 minutes, greater than 50s percent spent in counseling about anxiety and treatment options.

## 2012-06-02 ENCOUNTER — Telehealth: Payer: Self-pay | Admitting: *Deleted

## 2012-06-02 NOTE — Telephone Encounter (Signed)
Pt notified and will call back when rash is gone. Can not come in today can not get off work

## 2012-06-02 NOTE — Telephone Encounter (Signed)
Started on Wellbutrin XL took first dose on Saturday and now has a rash on arms, chest all way down to knees front and back. Hasn't took any today.

## 2012-06-02 NOTE — Telephone Encounter (Signed)
D/C med and see if rash resolves in a few days.  Call if getting worse or any respiratory problems. Call back a few days. I want rash to be resolved before we start a new medication. Can she make appt so we can look at the rash.

## 2012-07-08 ENCOUNTER — Ambulatory Visit: Payer: 59 | Admitting: Family Medicine

## 2012-10-29 ENCOUNTER — Encounter: Payer: Self-pay | Admitting: Family Medicine

## 2012-10-29 ENCOUNTER — Ambulatory Visit (INDEPENDENT_AMBULATORY_CARE_PROVIDER_SITE_OTHER): Payer: 59 | Admitting: Family Medicine

## 2012-10-29 VITALS — BP 114/70 | HR 56 | Ht 64.0 in | Wt 148.0 lb

## 2012-10-29 DIAGNOSIS — Z0000 Encounter for general adult medical examination without abnormal findings: Secondary | ICD-10-CM

## 2012-10-29 LAB — LIPID PANEL
HDL: 50 mg/dL (ref >39)
LDL Cholesterol: 108 mg/dL — ABNORMAL HIGH (ref 0–99)
VLDL: 17 mg/dL (ref 0–40)

## 2012-10-29 LAB — COMPLETE METABOLIC PANEL WITH GFR
Chloride: 107 mEq/L (ref 96–112)
Glucose, Bld: 95 mg/dL (ref 70–99)
Potassium: 4.2 mEq/L (ref 3.5–5.3)
Total Bilirubin: 0.8 mg/dL (ref 0.3–1.2)
Total Protein: 6.4 g/dL (ref 6.0–8.3)

## 2012-10-29 NOTE — Patient Instructions (Addendum)
Keep up a regular exercise program and make sure you are eating a healthy diet Try to eat 4 servings of dairy a day, or if you are lactose intolerant take a calcium with vitamin D daily.  Your vaccines are up to date.   

## 2012-10-29 NOTE — Progress Notes (Signed)
  Subjective:     Elizabeth [name] is a 52 y.o. female and is here for a comprehensive physical exam. The patient reports no problems.  History   Social History  . Marital Status: Divorced    Spouse Name: N/A    Number of Children: 0  . Years of Education: N/A   Occupational History  . VETERINARIAN    Social History Main Topics  . Smoking status: Never Smoker   . Smokeless tobacco: Not on file  . Alcohol Use: Yes     Comment: Rarely.   . Drug Use: No  . Sexually Active: Not Currently   Other Topics Concern  . Not on file   Social History Narrative   Some regular exercise.  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   Health Maintenance  Topic Date Due  . Mammogram  05/26/2012  . Colonoscopy  05/26/2012  . Influenza Vaccine  01/25/2013  . Pap Smear  12/26/2014  . Tetanus/tdap  08/06/2021    The following portions of the patient's history were reviewed and updated as appropriate: allergies, current medications, past family history, past medical history, past social history, past surgical history and problem list.  Review of Systems A comprehensive review of systems was negative.   Objective:    BP 114/70  Pulse 56  Ht 5\' 4"  (1.626 m)  Wt 148 lb (67.132 kg)  BMI 25.39 kg/m2 General appearance: alert, cooperative and appears stated age Head: Normocephalic, without obvious abnormality, atraumatic Eyes: conj clear, EOMI, PEELRA. Ears: normal TM's and external ear canals both ears Nose: Nares normal. Septum midline. Mucosa normal. No drainage or sinus tenderness. Throat: lips, mucosa, and tongue normal; teeth and gums normal Neck: no adenopathy, no JVD, supple, symmetrical, trachea midline and thyroid not enlarged, symmetric, no tenderness/mass/nodules Back: symmetric, no curvature. ROM normal. No CVA tenderness. Lungs: clear to auscultation bilaterally Heart: regular rate and rhythm, S1, S2 normal, no murmur, click, rub or  gallop Abdomen: soft, non-tender; bowel sounds normal; no masses,  no organomegaly Extremities: extremities normal, atraumatic, no cyanosis or edema Pulses: 2+ and symmetric Skin: Skin color, texture, turgor normal. No rashes or lesions Lymph nodes: Cervical and supraclaviculat nodes are normal.  Neurologic: Alert and oriented X 3, normal strength and tone. Normal symmetric reflexes. Normal coordination and gait    Assessment:    Healthy female exam.      Plan:     See After Visit Summary for Counseling Recommendations  Keep up a regular exercise program and make sure you are eating a healthy diet Try to eat 4 servings of dairy a day, or if you are lactose intolerant take a calcium with vitamin D daily.  Your vaccines are up to date.

## 2012-11-12 ENCOUNTER — Other Ambulatory Visit (HOSPITAL_COMMUNITY): Payer: Self-pay | Admitting: Obstetrics and Gynecology

## 2012-11-12 DIAGNOSIS — Z1231 Encounter for screening mammogram for malignant neoplasm of breast: Secondary | ICD-10-CM

## 2012-12-03 ENCOUNTER — Ambulatory Visit (HOSPITAL_COMMUNITY)
Admission: RE | Admit: 2012-12-03 | Discharge: 2012-12-03 | Disposition: A | Discharge status: Home / Self Care | Payer: BC Managed Care – PPO | Source: Ambulatory Visit | Attending: Obstetrics and Gynecology | Admitting: Obstetrics and Gynecology

## 2012-12-03 DIAGNOSIS — Z1231 Encounter for screening mammogram for malignant neoplasm of breast: Secondary | ICD-10-CM

## 2013-02-12 ENCOUNTER — Telehealth: Payer: Self-pay | Admitting: *Deleted

## 2013-02-12 MED ORDER — BUSPIRONE HCL 7.5 MG PO TABS: ORAL_TABLET | ORAL | Status: DC

## 2013-02-12 NOTE — Telephone Encounter (Signed)
Okay. I will send over a prescription for BuSpar. She can start with twice a day after 3 days if she feels she needs to go up to 3 times a day she can. See if we can get her an appointment sometime towards the middle of next week so that we can touch base and make sure that she's doing okay and we can adjust her medication as needed.

## 2013-02-12 NOTE — Telephone Encounter (Signed)
Pt notified med sent to pharmacy and needs to schedule followup with her next week. Barry Dienes, LPN

## 2013-02-12 NOTE — Telephone Encounter (Signed)
Pt having some anxiety issues, feels like wants to jump out of skin, some depression.  States a while back tried Wellbutrin and had a reaction.  Has tried Effexor and doesn't want back on that.  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  She wants to know if buspar may be an option.  States almost had a full blown panic attack this morning.  States she can make appointment if needed but wanted something until she can see you.  Pt states she has suicidal thoughts but no specific plans

## 2013-02-18 ENCOUNTER — Encounter: Payer: Self-pay | Admitting: Family Medicine

## 2013-02-18 ENCOUNTER — Ambulatory Visit (INDEPENDENT_AMBULATORY_CARE_PROVIDER_SITE_OTHER): Payer: BC Managed Care – PPO | Admitting: Family Medicine

## 2013-02-18 VITALS — BP 126/69 | HR 52 | Wt 146.0 lb

## 2013-02-18 DIAGNOSIS — Z23 Encounter for immunization: Secondary | ICD-10-CM

## 2013-02-18 DIAGNOSIS — F419 Anxiety disorder, unspecified: Secondary | ICD-10-CM

## 2013-02-18 DIAGNOSIS — F32A Depression, unspecified: Secondary | ICD-10-CM

## 2013-02-18 DIAGNOSIS — F329 Major depressive disorder, single episode, unspecified: Secondary | ICD-10-CM

## 2013-02-18 DIAGNOSIS — F341 Dysthymic disorder: Secondary | ICD-10-CM

## 2013-02-18 NOTE — Progress Notes (Signed)
  Subjective:    Patient ID: Elizabeth [name], female    DOB: 1961/03/17, 52 y.o.   MRN: [id number]  HPI Anxiety - Had a rash on Wellbutrin after taking it for 3 days.  She is now on the buspar.  1/2 tab in AM and whole tab at night. Had one panic attack and has been years since had one. Tolerating new med well. Sleep is fair. Having early morning awakening.  She is fell down having thought of being better off dead but no suicidal thoughts.    Review of Systems     Objective:   Physical Exam  Constitutional: She is oriented to person, place, and time. She appears well-developed and well-nourished.  HENT:  Head: Normocephalic and atraumatic.  Neurological: She is alert and oriented to person, place, and time.  Skin: Skin is warm and dry.  Psychiatric: She has a normal mood and affect.          Assessment & Plan:  Anxiety/depression-her gad 7 score is 9 today. PHQ 9 score is 11. So far she's been on the BuSpar for about 6 days. She's tolerating it well and has not expressed a rash. We'll continue his current regimen and I would like to see her back in about one month. She can titrate up to 3 times a day if needed or can stick with just twice a day until I see her back. If she's not improving significantly at that point in time we could consider one of the newer medications such Viibryd or Fetzima. She did well on Paxil but has weight gain.

## 2013-03-24 ENCOUNTER — Encounter: Payer: Self-pay | Admitting: Family Medicine

## 2013-03-24 ENCOUNTER — Ambulatory Visit (INDEPENDENT_AMBULATORY_CARE_PROVIDER_SITE_OTHER): Payer: BC Managed Care – PPO | Admitting: Family Medicine

## 2013-03-24 VITALS — BP 113/67 | HR 53 | Wt 146.0 lb

## 2013-03-24 DIAGNOSIS — F418 Other specified anxiety disorders: Secondary | ICD-10-CM

## 2013-03-24 DIAGNOSIS — F341 Dysthymic disorder: Secondary | ICD-10-CM

## 2013-03-24 DIAGNOSIS — G4700 Insomnia, unspecified: Secondary | ICD-10-CM

## 2013-03-24 MED ORDER — BUSPIRONE HCL 5 MG PO TABS: ORAL_TABLET | ORAL | Status: DC

## 2013-03-24 NOTE — Progress Notes (Signed)
  Subjective:    Patient ID: Elizabeth [name], female    DOB: 1960/09/09, 52 y.o.   MRN: [id number]  HPI   Depression/Anxiety.  Has been a little stressed lately. 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 She feels that the BuSpar has been helpful. She feels like the dose does need to be titrated. She would like to take an extra dose in the middle of the day but does not want to take the 7.5 mg dose she would prefer to try 5 mg the afternoon and reserve the 7.5 mg for the morning in the evenings. She still having some significant issues with sleep. See below.  Insomnia - having a hard time falling asleep and staying asleep. Some caffeine in early afternoon, but nothing has 2:00.  Dogs do sleep on the bed with her and if she moves just a little and they tend to get up and move around as well.  Does have TV in the bedroom. But says she does not use TV to fall asleep. She has tried Benadryl in the past but was very sedating and cause excess grogginess. She's also tried trazodone in the past and that caused excessive grogginess as well.   Review of Systems     Objective:   Physical Exam  Constitutional: She is oriented to person, place, and time. She appears well-developed and well-nourished.  HENT:  Head: Normocephalic and atraumatic.  Cardiovascular: Normal rate, regular rhythm and normal heart sounds.   Pulmonary/Chest: Effort normal and breath sounds normal.  Neurological: She is alert and oriented to person, place, and time.  Skin: Skin is warm and dry.  Psychiatric: She has a normal mood and affect. Her behavior is normal. Judgment and thought content normal.          Assessment and plan:  Depression and Anxiety - PHQ 9 score of 8 today. Gad 7 score of 6. Improved from last time. We will adjust her dose so that she can take 7.5 mg in the morning, 5 mg in the afternoon, and 7.5 mg at bedtime.  Insomnia - we  discussed the importance of sleep hygiene. She's already doing most of the right things. We did discuss adding a consistent wake and sleep time. Also recommended that she not have her talk sleep in the bed with her but she says that she is not willing to remove them from the bed. We discussed over-the-counter treatments for sleep. She is Re: tried Benadryl and trazodone in the past and did not do well with it then I recommend a trial of valerian root. If this is not helpful then we could consider an Ambien-type product but it can be habit-forming and discuss that with her. She would like to try valerian root first. She's having a getting the dosing on the BuSpar titrated, will help her sleep as well. Also encourage regular exercise as this helps with sleep as well.

## 2013-05-25 ENCOUNTER — Ambulatory Visit (INDEPENDENT_AMBULATORY_CARE_PROVIDER_SITE_OTHER): Payer: BC Managed Care – PPO | Admitting: Family Medicine

## 2013-05-25 ENCOUNTER — Encounter: Payer: Self-pay | Admitting: Family Medicine

## 2013-05-25 VITALS — BP 113/68 | HR 50 | Temp 97.0°F | Wt 144.0 lb

## 2013-05-25 DIAGNOSIS — G2581 Restless legs syndrome: Secondary | ICD-10-CM

## 2013-05-25 DIAGNOSIS — F418 Other specified anxiety disorders: Secondary | ICD-10-CM

## 2013-05-25 DIAGNOSIS — F341 Dysthymic disorder: Secondary | ICD-10-CM

## 2013-05-25 NOTE — Progress Notes (Signed)
   Subjective:    Patient ID: Elizabeth [name], female    DOB: 1960/10/19, 52 y.o.   MRN: [id number]  HPI  Depression/ANxiety - last seen 2 mo ago, PHQ 9 score of 8 today. Gad 7 score of 6 at that time.  Adjusted her Buspar.  She is doing really well. Sleep is better.  Some trouble concentrating. Still occ feeling nervous. Still feels down some days.  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   RLS - Doing well on the neurontin.  No S.E. Says if misses a dose she notices difference.     Review of Systems     Objective:   Physical Exam  Constitutional: She is oriented to person, place, and time. She appears well-developed and well-nourished.  HENT:  Head: Normocephalic and atraumatic.  Eyes: Conjunctivae and EOM are normal.  Cardiovascular: Normal rate.   Pulmonary/Chest: Effort normal.  Neurological: She is alert and oriented to person, place, and time.  Skin: Skin is dry. No pallor.  Psychiatric: She has a normal mood and affect. Her behavior is normal.          Assessment & Plan:  Depression/Anxiety - PHQ- 9 score of 4, and GAD- 7 score of 2. Well controlled on current regimen. F/U in 5-6 months. Sleep is well controlled. Currentely.   RLS - Doing well on neurontin.  Well controlled.   Screening colonoscopy - encouraged her to check with her insurance on coverage and call me. If can't afford it this year then recommend stool cards at minimum.

## 2013-07-29 ENCOUNTER — Other Ambulatory Visit: Payer: Self-pay | Admitting: *Deleted

## 2013-07-29 MED ORDER — BUSPIRONE HCL 5 MG PO TABS: ORAL_TABLET | ORAL | Status: DC

## 2013-07-30 ENCOUNTER — Other Ambulatory Visit: Payer: Self-pay | Admitting: *Deleted

## 2013-07-30 MED ORDER — BUSPIRONE HCL 5 MG PO TABS: ORAL_TABLET | ORAL | Status: DC

## 2013-11-03 ENCOUNTER — Ambulatory Visit (INDEPENDENT_AMBULATORY_CARE_PROVIDER_SITE_OTHER): Payer: BC Managed Care – PPO | Admitting: Family Medicine

## 2013-11-03 ENCOUNTER — Encounter: Payer: Self-pay | Admitting: Family Medicine

## 2013-11-03 VITALS — BP 130/81 | HR 63 | Wt 150.0 lb

## 2013-11-03 DIAGNOSIS — E785 Hyperlipidemia, unspecified: Secondary | ICD-10-CM

## 2013-11-03 DIAGNOSIS — Z0000 Encounter for general adult medical examination without abnormal findings: Secondary | ICD-10-CM

## 2013-11-03 DIAGNOSIS — R7301 Impaired fasting glucose: Secondary | ICD-10-CM

## 2013-11-03 DIAGNOSIS — Z1211 Encounter for screening for malignant neoplasm of colon: Secondary | ICD-10-CM

## 2013-11-03 DIAGNOSIS — Z1239 Encounter for other screening for malignant neoplasm of breast: Secondary | ICD-10-CM

## 2013-11-03 LAB — COMPLETE METABOLIC PANEL WITH GFR
ALBUMIN: 4.1 g/dL (ref 3.5–5.2)
ALK PHOS: 33 U/L — AB (ref 39–117)
ALT: 15 U/L (ref 0–35)
AST: 14 U/L (ref 0–37)
BUN: 16 mg/dL (ref 6–23)
CALCIUM: 8.9 mg/dL (ref 8.4–10.5)
CO2: 25 mEq/L (ref 19–32)
CREATININE: 0.91 mg/dL (ref 0.50–1.10)
Chloride: 107 mEq/L (ref 96–112)
GFR, Est African American: 84 mL/min
GFR, Est Non African American: 73 mL/min
Glucose, Bld: 89 mg/dL (ref 70–99)
Potassium: 4.2 mEq/L (ref 3.5–5.3)
Sodium: 141 mEq/L (ref 135–145)
Total Bilirubin: 0.7 mg/dL (ref 0.2–1.2)
Total Protein: 6.4 g/dL (ref 6.0–8.3)

## 2013-11-03 LAB — LIPID PANEL
CHOL/HDL RATIO: 2.8 ratio
CHOLESTEROL: 165 mg/dL (ref 0–200)
HDL: 58 mg/dL (ref >39)
LDL Cholesterol: 96 mg/dL (ref 0–99)
TRIGLYCERIDES: 57 mg/dL (ref <150)
VLDL: 11 mg/dL (ref 0–40)

## 2013-11-03 NOTE — Progress Notes (Signed)
CC: Elizabeth [name] is a 53 y.o. female is here for Annual Exam   Subjective: HPI:  Colonoscopy: Overdue, we have placed referral for her today Papsmear: 12/2009 normal, she believes she also had a normal one in 2013, she plans on following up with her GYN doctor for continued 3 year surveillance and well woman exam Mammogram: She will be due for repeat in July, referral has been placed  Influenza Vaccine: No current indication Pneumovax: No current indication Td/Tdap: UTD Tdap 2013 Zoster: (Start 53 yo)  Presents with no acute complaints however she believes she was scheduled to be seen by her PCP today, it appears there is a scheduling error and she was placed on my schedule by accident. She was offered to have this appointment rescheduled however she is unable to take off work in the near future.  No alcohol, tobacco, nor recreational drug use  Review of Systems - General ROS: negative for - chills, fever, night sweats, weight gain or weight loss Ophthalmic ROS: negative for - decreased vision Psychological ROS: negative for - anxiety or depression ENT ROS: negative for - hearing change, nasal congestion, tinnitus or allergies Hematological and Lymphatic ROS: negative for - bleeding problems, bruising or swollen lymph nodes Breast ROS: negative Respiratory ROS: no cough, shortness of breath, or wheezing Cardiovascular ROS: no chest pain or dyspnea on exertion Gastrointestinal ROS: no abdominal pain, change in bowel habits, or black or bloody stools Genito-Urinary ROS: negative for - genital discharge, genital ulcers, incontinence or abnormal bleeding from genitals Musculoskeletal ROS: negative for - joint pain or muscle pain Neurological ROS: negative for - headaches or memory loss Dermatological ROS: negative for lumps, mole changes, rash and skin lesion changes  Past Medical History  Diagnosis Date  . Fibroid, uterine     Past Surgical History  Procedure Laterality Date   . Inguinal hernia repair      bilat at 28month.   . Tear duct probing      age 66   . Tonsillectomy and adenoidectomy      age 25   . Laser ablation of the cervix    . Dilation and curettage of uterus      x 2 for bleeding   Family History  Problem Relation Age of Onset  . Hypertension Father   . Heart disease Father   . Arthritis Father   . Heart disease Mother     Septal defect   . Cancer Brother     leiiomyoscarcom  . Hypertension Brother   . Cancer Brother     giant cell tumor.   . Diabetes Father     History   Social History  . Marital Status: Divorced    Spouse Name: N/A    Number of Children: 0  . Years of Education: N/A   Occupational History  . VETERINARIAN    Social History Main Topics  . Smoking status: Never Smoker   . Smokeless tobacco: Not on file  . Alcohol Use: Yes     Comment: Rarely.   . Drug Use: No  . Sexual Activity: Not Currently   Other Topics Concern  . Not on file   Social History Narrative   Some regular exercise.  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     Objective: BP 130/81  Pulse 63  Wt 150 lb (68.04 kg)  General: No Acute Distress HEENT: Atraumatic, normocephalic, conjunctivae normal without scleral icterus.  No nasal discharge, hearing  grossly intact, TMs with good landmarks bilaterally with no middle ear abnormalities, posterior pharynx clear without oral lesions. Neck: Supple, trachea midline, no cervical nor supraclavicular adenopathy. Pulmonary: Clear to auscultation bilaterally without wheezing, rhonchi, nor rales. Cardiac: Regular rate and rhythm.  No murmurs, rubs, nor gallops. No peripheral edema.  2+ peripheral pulses bilaterally. Abdomen: Bowel sounds normal.  No masses.  Non-tender without rebound.  Negative Murphy's sign. GU: Deferred to her future well woman exam with GYN MSK: Grossly intact, no signs of weakness.  Full strength throughout upper and lower extremities.  Full ROM in  upper and lower extremities.  No midline spinal tenderness. Neuro: Gait unremarkable, CN II-XII grossly intact.  C5-C6 Reflex 2/4 Bilaterally, L4 Reflex 2/4 Bilaterally.  Cerebellar function intact. Skin: No rashes. Single seborrheic keratosis on the back just below the scapula, noninflamed Psych: Alert and oriented to person/place/time.  Thought process normal. No anxiety/depression.   Assessment & Plan: Elizabeth [name] was seen today for annual exam.  Diagnoses and associated orders for this visit:  Annual physical exam - COMPLETE METABOLIC PANEL WITH GFR  Impaired fasting glucose - COMPLETE METABOLIC PANEL WITH GFR  Hyperlipidemia - Lipid panel  Screening for breast cancer - MM DIGITAL SCREENING BILATERAL; Future  Screening for colon cancer - Ambulatory referral to Gastroenterology    Healthy lifestyle interventions including but not limited to regular exercise, a healthy low fat diet, moderation of salt intake, the dangers of tobacco/alcohol/recreational drug use, nutrition supplementation, and accident avoidance were discussed with the patient and a handout was provided for future reference.  Return in about 1 year (around 11/04/2014) for annual physical.

## 2013-11-03 NOTE — Patient Instructions (Signed)
Dr. Brodin Gelpi's General Advice Following Your Complete Physical Exam  The Benefits of Regular Exercise: Unless you suffer from an uncontrolled cardiovascular condition, studies strongly suggest that regular exercise and physical activity will add to both the quality and length of your life.  The World Health Organization recommends 150 minutes of moderate intensity aerobic activity every week.  This is best split over 3-4 days a week, and can be as simple as a brisk walk for just over 35 minutes "most days of the week".  This type of exercise has been shown to lower LDL-Cholesterol, lower average blood sugars, lower blood pressure, lower cardiovascular disease risk, improve memory, and increase one's overall sense of wellbeing.  The addition of anaerobic (or "strength training") exercises offers additional benefits including but not limited to increased metabolism, prevention of osteoporosis, and improved overall cholesterol levels.  How Can I Strive For A Low-Fat Diet?: Current guidelines recommend that 25-35 percent of your daily energy (food) intake should come from fats.  One might ask how can this be achieved without having to dissect each meal on a daily basis?  Switch to skim or 1% milk instead of whole milk.  Focus on lean meats such as ground turkey, fresh fish, baked chicken, and lean cuts of beef as your source of dietary protein.  Limit saturated fat consumption to less than 10% of your daily caloric intake.  Limit trans fatty acid consumption primarily by limiting synthetic trans fats such as partially hydrogenated oils (Ex: fried fast foods).  Substitute olive or vegetable oil for solid fats where possible.  Moderation of Salt Intake: Provided you don't carry a diagnosis of congestive heart failure nor renal failure, I recommend a daily allowance of no more than 2300 mg of salt (sodium).  Keeping under this daily goal is associated with a decreased risk of cardiovascular events, creeping  above it can lead to elevated blood pressures and increases your risk of cardiovascular events.  Milligrams (mg) of salt is listed on all nutrition labels, and your daily intake can add up faster than you think.  Most canned and frozen dinners can pack in over half your daily salt allowance in one meal.    Lifestyle Health Risks: Certain lifestyle choices carry specific health risks.  As you may already know, tobacco use has been associated with increasing one's risk of cardiovascular disease, pulmonary disease, numerous cancers, among many other issues.  What you may not know is that there are medications and nicotine replacement strategies that can more than double your chances of successfully quitting.  I would be thrilled to help manage your quitting strategy if you currently use tobacco products.  When it comes to alcohol use, I've yet to find an "ideal" daily allowance.  Provided an individual does not have a medical condition that is exacerbated by alcohol consumption, general guidelines determine "safe drinking" as no more than two standard drinks for a man or no more than one standard drink for a female per day.  However, much debate still exists on whether any amount of alcohol consumption is technically "safe".  My general advice, keep alcohol consumption to a minimum for general health promotion.  If you or others believe that alcohol, tobacco, or recreational drug use is interfering with your life, I would be happy to provide confidential counseling regarding treatment options.  General "Over The Counter" Nutrition Advice: Postmenopausal women should aim for a daily calcium intake of 1200 mg, however a significant portion of this might already be   provided by diets including milk, yogurt, cheese, and other dairy products.  Vitamin D has been shown to help preserve bone density, prevent fatigue, and has even been shown to help reduce falls in the elderly.  Ensuring a daily intake of 800 Units of  Vitamin D is a good place to start to enjoy the above benefits, we can easily check your Vitamin D level to see if you'd potentially benefit from supplementation beyond 800 Units a day.  Folic Acid intake should be of particular concern to women of childbearing age.  Daily consumption of 400-800 mcg of Folic Acid is recommended to minimize the chance of spinal cord defects in a fetus should pregnancy occur.    For many adults, accidents still remain one of the most common culprits when it comes to cause of death.  Some of the simplest but most effective preventitive habits you can adopt include regular seatbelt use, proper helmet use, securing firearms, and regularly testing your smoke and carbon monoxide detectors.  Leldon Steege B. Annajulia Lewing DO [facility] [address], [address] [place], [place] [address] Phone: [phone]  

## 2013-12-09 ENCOUNTER — Encounter: Payer: Self-pay | Admitting: Family Medicine

## 2013-12-16 ENCOUNTER — Encounter: Payer: Self-pay | Admitting: Family Medicine

## 2013-12-20 ENCOUNTER — Other Ambulatory Visit (HOSPITAL_COMMUNITY): Payer: Self-pay | Admitting: Obstetrics and Gynecology

## 2013-12-20 ENCOUNTER — Encounter: Payer: Self-pay | Admitting: Internal Medicine

## 2013-12-20 DIAGNOSIS — Z1231 Encounter for screening mammogram for malignant neoplasm of breast: Secondary | ICD-10-CM

## 2014-01-12 ENCOUNTER — Other Ambulatory Visit: Payer: Self-pay | Admitting: Family Medicine

## 2014-02-09 ENCOUNTER — Ambulatory Visit (HOSPITAL_COMMUNITY): Payer: BC Managed Care – PPO

## 2014-02-10 ENCOUNTER — Ambulatory Visit (AMBULATORY_SURGERY_CENTER): Payer: Self-pay | Admitting: *Deleted

## 2014-02-10 ENCOUNTER — Ambulatory Visit (HOSPITAL_COMMUNITY)
Admission: RE | Admit: 2014-02-10 | Discharge: 2014-02-10 | Disposition: A | Discharge status: Home / Self Care | Payer: BC Managed Care – PPO | Source: Ambulatory Visit | Attending: Obstetrics and Gynecology | Admitting: Obstetrics and Gynecology

## 2014-02-10 VITALS — Ht 64.0 in | Wt 157.4 lb

## 2014-02-10 DIAGNOSIS — Z1231 Encounter for screening mammogram for malignant neoplasm of breast: Secondary | ICD-10-CM | POA: Diagnosis present

## 2014-02-10 DIAGNOSIS — Z1211 Encounter for screening for malignant neoplasm of colon: Secondary | ICD-10-CM

## 2014-02-10 MED ORDER — MOVIPREP 100 G PO SOLR: 1.0000 | Freq: Once | ORAL | Status: DC

## 2014-02-10 NOTE — Progress Notes (Signed)
No home 02 use. ewm No problems with past sedation. Pt states she is a light weight. ewm Pt declined emmi video. ewm No diet pills. ewm No egg or soy allergy. emw

## 2014-02-15 ENCOUNTER — Encounter: Payer: Self-pay | Admitting: Internal Medicine

## 2014-02-22 ENCOUNTER — Other Ambulatory Visit: Payer: Self-pay | Admitting: Family Medicine

## 2014-02-23 ENCOUNTER — Encounter: Payer: Self-pay | Admitting: Internal Medicine

## 2014-02-23 ENCOUNTER — Ambulatory Visit (AMBULATORY_SURGERY_CENTER): Payer: BC Managed Care – PPO | Admitting: Internal Medicine

## 2014-02-23 VITALS — BP 136/77 | HR 55 | Temp 98.0°F | Resp 11 | Ht 64.0 in | Wt 157.0 lb

## 2014-02-23 DIAGNOSIS — Z1211 Encounter for screening for malignant neoplasm of colon: Secondary | ICD-10-CM

## 2014-02-23 HISTORY — PX: COLONOSCOPY: SHX174

## 2014-02-23 MED ORDER — SODIUM CHLORIDE 0.9 % IV SOLN: 500.0000 mL | INTRAVENOUS | Status: DC

## 2014-02-23 NOTE — Patient Instructions (Signed)
YOU HAD AN ENDOSCOPIC PROCEDURE TODAY AT [facility]: Refer to the procedure report that was given to you for any specific questions about what was found during the examination.  If the procedure report does not answer your questions, please call your gastroenterologist to clarify.  If you requested that your care partner not be given the details of your procedure findings, then the procedure report has been included in a sealed envelope for you to review at your convenience later.  YOU SHOULD EXPECT: Some feelings of bloating in the abdomen. Passage of more gas than usual.  Walking can help get rid of the air that was put into your GI tract during the procedure and reduce the bloating. If you had a lower endoscopy (such as a colonoscopy or flexible sigmoidoscopy) you may notice spotting of blood in your stool or on the toilet paper. If you underwent a bowel prep for your procedure, then you may not have a normal bowel movement for a few days.  DIET: Your first meal following the procedure should be a light meal and then it is ok to progress to your normal diet.  A half-sandwich or bowl of soup is an example of a good first meal.  Heavy or fried foods are harder to digest and may make you feel nauseous or bloated.  Likewise meals heavy in dairy and vegetables can cause extra gas to form and this can also increase the bloating.  Drink plenty of fluids but you should avoid alcoholic beverages for 24 hours.  ACTIVITY: Your care partner should take you home directly after the procedure.  You should plan to take it easy, moving slowly for the rest of the day.  You can resume normal activity the day after the procedure however you should NOT DRIVE or use heavy machinery for 24 hours (because of the sedation medicines used during the test).    SYMPTOMS TO REPORT IMMEDIATELY: A gastroenterologist can be reached at any hour.  During normal business hours, 8:30 AM to 5:00 PM Monday through Friday,  call [phone].  After hours and on weekends, please call the GI answering service at [phone] who will take a message and have the physician on call contact you.   Following lower endoscopy (colonoscopy or flexible sigmoidoscopy):  Excessive amounts of blood in the stool  Significant tenderness or worsening of abdominal pains  Swelling of the abdomen that is new, acute  Fever of 100F or higher  F  FOLLOW UP: If any biopsies were taken you will be contacted by phone or by letter within the next 1-3 weeks.  Call your gastroenterologist if you have not heard about the biopsies in 3 weeks.  Our staff will call the home number listed on your records the next business day following your procedure to check on you and address any questions or concerns that you may have at that time regarding the information given to you following your procedure. This is a courtesy call and so if there is no answer at the home number and we have not heard from you through the emergency physician on call, we will assume that you have returned to your regular daily activities without incident.  SIGNATURES/CONFIDENTIALITY: You and/or your care partner have signed paperwork which will be entered into your electronic medical record.  These signatures attest to the fact that that the information above on your After Visit Summary has been reviewed and is understood.  Full responsibility of the confidentiality  of this discharge information lies with you and/or your care-partner.  High fiber diet.  Next colonscopy 10 years. 2025.

## 2014-02-23 NOTE — Progress Notes (Signed)
Waiting for Dr. Brody 

## 2014-02-23 NOTE — Progress Notes (Signed)
A/ox3, pleased with MAC, report to RN 

## 2014-02-23 NOTE — Op Note (Signed)
[facility]  [address] [place], [address]   COLONOSCOPY PROCEDURE REPORT  PATIENT: Elizabeth [name], Elizabeth [name]  MR#: [id number] BIRTHDATE: Jan 06, 1961 , 52  yrs. old GENDER: female ENDOSCOPIST: Lafayette Dragon, MD REFERRED JS:[id number] Madilyn Fireman, M.D. PROCEDURE DATE:  02/23/2014 PROCEDURE:   Colonoscopy, screening First Screening Colonoscopy - Avg.  risk and is 50 yrs.  old or older Yes.  Prior Negative Screening - Now for repeat screening. N/A  History of Adenoma - Now for follow-up colonoscopy & has been > or = to 3 yrs.  N/A  Polyps Removed Today? No. ASA CLASS:   Class I INDICATIONS:average risk for colon cancer. MEDICATIONS: Monitored anesthesia care and Propofol 400 mg IV  DESCRIPTION OF PROCEDURE:   After the risks benefits and alternatives of the procedure were thoroughly explained, informed consent was obtained.  The digital rectal exam revealed no abnormalities of the rectum.   The     endoscope was introduced through the anus and advanced to the cecum, which was identified by both the appendix and ileocecal valve. No adverse events experienced.   The quality of the prep was excellent, using MoviPrep  The instrument was then slowly withdrawn as the colon was fully examined.      COLON FINDINGS: A normal appearing cecum, ileocecal valve, and appendiceal orifice were identified.  The ascending, transverse, descending, sigmoid colon, and rectum appeared unremarkable. Retroflexed views revealed no abnormalities. The time to cecum=7 minutes 42 seconds.  Withdrawal time=6 minutes 31 seconds.  The scope was withdrawn and the procedure completed. COMPLICATIONS: There were no immediate complications.  ENDOSCOPIC IMPRESSION: Normal colonoscopy  RECOMMENDATIONS: High fiber diet Recall colonoscopy in 10 years  eSigned:  Lafayette Dragon, MD 02/23/2014 9:14 AM   cc:

## 2014-02-24 ENCOUNTER — Telehealth: Payer: Self-pay | Admitting: *Deleted

## 2014-02-24 NOTE — Telephone Encounter (Signed)
  Follow up Call-  Call back number 02/23/2014  Post procedure Call Back phone  # [phone]  Permission to leave phone message Yes     Patient questions:  Do you have a fever, pain , or abdominal swelling? Yes.   Pain Score  0 *  Have you tolerated food without any problems? Yes.    Have you been able to return to your normal activities? Yes.    Do you have any questions about your discharge instructions: Diet   No. Medications  No. Follow up visit  No.  Do you have questions or concerns about your Care? No.  Actions: * If pain score is 4 or above: No action needed, pain <4.

## 2014-04-05 ENCOUNTER — Other Ambulatory Visit: Payer: Self-pay | Admitting: Family Medicine

## 2014-05-16 ENCOUNTER — Telehealth: Payer: Self-pay

## 2014-05-16 MED ORDER — ALPRAZOLAM 0.25 MG PO TABS: 0.2500 mg | ORAL_TABLET | Freq: Every evening | ORAL | Status: DC | PRN

## 2014-05-16 NOTE — Telephone Encounter (Signed)
Ok to fill for 30 tabs, w/ no RF

## 2014-05-16 NOTE — Telephone Encounter (Signed)
Printed and will fax after signed.

## 2014-05-16 NOTE — Telephone Encounter (Signed)
Elizabeth [name] left a message wanting a refill on Xanax 0.25 mg #30. This medication is not on current medication list. She states she takes it very rarely. Last filled 2013. Please advise.

## 2014-10-19 ENCOUNTER — Other Ambulatory Visit: Payer: Self-pay | Admitting: Family Medicine

## 2014-11-03 ENCOUNTER — Telehealth: Payer: Self-pay | Admitting: Family Medicine

## 2014-11-03 DIAGNOSIS — Z0000 Encounter for general adult medical examination without abnormal findings: Secondary | ICD-10-CM

## 2014-11-03 NOTE — Telephone Encounter (Signed)
[facility] for CMP with GFR, lipids. ferritin

## 2014-11-03 NOTE — Telephone Encounter (Signed)
Patient called to see if we could go ahead and order lab work so she can have them done before her upcoming appt in July. Advised I would route to PCP for review and to see which labs should be ordered.

## 2014-11-04 NOTE — Telephone Encounter (Signed)
Sent to lab. Left a message on voicemail advising the order has been placed.

## 2014-12-14 ENCOUNTER — Other Ambulatory Visit: Payer: Self-pay | Admitting: Family Medicine

## 2014-12-22 ENCOUNTER — Ambulatory Visit (INDEPENDENT_AMBULATORY_CARE_PROVIDER_SITE_OTHER): Payer: No Typology Code available for payment source | Admitting: Family Medicine

## 2014-12-22 ENCOUNTER — Encounter: Payer: Self-pay | Admitting: Family Medicine

## 2014-12-22 ENCOUNTER — Telehealth: Payer: Self-pay | Admitting: Family Medicine

## 2014-12-22 VITALS — BP 134/78 | HR 60 | Wt 157.0 lb

## 2014-12-22 DIAGNOSIS — R7301 Impaired fasting glucose: Secondary | ICD-10-CM

## 2014-12-22 DIAGNOSIS — G2581 Restless legs syndrome: Secondary | ICD-10-CM

## 2014-12-22 DIAGNOSIS — Z1159 Encounter for screening for other viral diseases: Secondary | ICD-10-CM

## 2014-12-22 DIAGNOSIS — Z0189 Encounter for other specified special examinations: Secondary | ICD-10-CM

## 2014-12-22 DIAGNOSIS — Z114 Encounter for screening for human immunodeficiency virus [HIV]: Secondary | ICD-10-CM | POA: Diagnosis not present

## 2014-12-22 DIAGNOSIS — Z0000 Encounter for general adult medical examination without abnormal findings: Secondary | ICD-10-CM

## 2014-12-22 LAB — LIPID PANEL
CHOL/HDL RATIO: 2.9 ratio (ref <=5.0)
CHOLESTEROL: 169 mg/dL (ref 125–200)
HDL: 58 mg/dL (ref >=46)
LDL Cholesterol: 99 mg/dL (ref <130)
Triglycerides: 60 mg/dL (ref <150)
VLDL: 12 mg/dL (ref <30)

## 2014-12-22 LAB — POCT GLYCOSYLATED HEMOGLOBIN (HGB A1C): HEMOGLOBIN A1C: 5.3

## 2014-12-22 LAB — FERRITIN: Ferritin: 104 ng/mL (ref 10–291)

## 2014-12-22 LAB — COMPLETE METABOLIC PANEL WITH GFR
ALT: 19 U/L (ref 6–29)
AST: 17 U/L (ref 10–35)
Albumin: 4.3 g/dL (ref 3.6–5.1)
Alkaline Phosphatase: 44 U/L (ref 33–130)
BUN: 14 mg/dL (ref 7–25)
CO2: 26 mEq/L (ref 20–31)
Calcium: 9.3 mg/dL (ref 8.6–10.4)
Chloride: 105 mEq/L (ref 98–110)
Creat: 0.98 mg/dL (ref 0.50–1.05)
GFR, Est African American: 77 mL/min (ref >=60)
GFR, Est Non African American: 67 mL/min (ref >=60)
Glucose, Bld: 97 mg/dL (ref 65–99)
Potassium: 4.4 mEq/L (ref 3.5–5.3)
SODIUM: 141 meq/L (ref 135–146)
Total Bilirubin: 0.7 mg/dL (ref 0.2–1.2)
Total Protein: 6.6 g/dL (ref 6.1–8.1)

## 2014-12-22 MED ORDER — BUSPIRONE HCL 5 MG PO TABS: ORAL_TABLET | ORAL | Status: DC

## 2014-12-22 MED ORDER — ALPRAZOLAM 0.25 MG PO TABS: 0.2500 mg | ORAL_TABLET | Freq: Every evening | ORAL | Status: DC | PRN

## 2014-12-22 NOTE — Telephone Encounter (Signed)
Patient walked in and request to know if she can have the cpt codes for HIV, SHINGLES AND HEP C so she can give to her insurance company to see if they will cover these injections before she gets them done. Pt req a call back to be adv. Thanks

## 2014-12-22 NOTE — Progress Notes (Signed)
  Subjective:     Elizabeth [name] is a 54 y.o. female and is here for a comprehensive physical exam. The patient reports no problems.  History   Social History  . Marital Status: Legally Separated    Spouse Name: N/A  . Number of Children: 0  . Years of Education: N/A   Occupational History  . VETERINARIAN    Social History Main Topics  . Smoking status: Never Smoker   . Smokeless tobacco: Never Used  . Alcohol Use: Yes     Comment: Rarely.   . Drug Use: No  . Sexual Activity: Not Currently   Other Topics Concern  . Not on file   Social History Narrative   Some regular exercise.  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   Health Maintenance  Topic Date Due  . Hepatitis C Screening  1960/11/13  . HIV Screening  05/26/1977  . INFLUENZA VACCINE  12/26/2014  . PAP SMEAR  12/26/2014  . MAMMOGRAM  02/11/2016  . TETANUS/TDAP  08/06/2021  . COLONOSCOPY  02/24/2024    The following portions of the patient's history were reviewed and updated as appropriate: allergies, current medications, past family history, past medical history, past social history, past surgical history and problem list.  Review of Systems A comprehensive review of systems was negative.   Objective:    There were no vitals taken for this visit. General appearance: alert, cooperative and appears stated age Head: Normocephalic, without obvious abnormality, atraumatic Eyes: conj clear, EOMI, PEERL Ears: normal TM's and external ear canals both ears Nose: Nares normal. Septum midline. Mucosa normal. No drainage or sinus tenderness. Throat: lips, mucosa, and tongue normal; teeth and gums normal Neck: no adenopathy, no carotid bruit, no JVD, supple, symmetrical, trachea midline and thyroid not enlarged, symmetric, no tenderness/mass/nodules Back: symmetric, no curvature. ROM normal. No CVA tenderness. Lungs: clear to auscultation bilaterally Heart: regular rate and rhythm, S1,  S2 normal, no murmur, click, rub or gallop Abdomen: soft, non-tender; bowel sounds normal; no masses,  no organomegaly Extremities: extremities normal, atraumatic, no cyanosis or edema Pulses: 2+ and symmetric Skin: Skin color, texture, turgor normal. No rashes or lesions Lymph nodes: Cervical, supraclavicular, and axillary nodes normal. Neurologic: Alert and oriented X 3, normal strength and tone. Normal symmetric reflexes. Normal coordination and gait    Assessment:    Healthy female exam.      Plan:     See After Visit Summary for Counseling Recommendations  Keep up a regular exercise program and make sure you are eating a healthy diet Try to eat 4 servings of dairy a day, or if you are lactose intolerant take a calcium with vitamin D daily.  Your vaccines are up to date.   IFG/Family history of diabetes-hemoglobin A1c of 5.3 today which looks fantastic.   We will call over to [facility] to get the most up-to-date copy of her Pap smear. She sees the nurse midwife Gustavo Lah there. Next  We also discussed shingles vaccination. Given handout. Encouraged her to call insurance to check on coverage. She can schedule a nurse visit if she would like.  [facility] - follows with Neurology

## 2014-12-22 NOTE — Patient Instructions (Signed)
Keep up a regular exercise program and make sure you are eating a healthy diet Try to eat 4 servings of dairy a day, or if you are lactose intolerant take a calcium with vitamin D daily.  Your vaccines are up to date.   

## 2014-12-28 NOTE — Telephone Encounter (Signed)
Left detailed message.   Shingles vaccine cpt code [phone] Administration of vaccine cpt code 18984 HIV cpt code [phone] Hep C cpt code [phone]

## 2015-04-28 ENCOUNTER — Other Ambulatory Visit: Payer: Self-pay | Admitting: Family Medicine

## 2015-04-28 DIAGNOSIS — Z23 Encounter for immunization: Secondary | ICD-10-CM

## 2015-04-28 MED ORDER — ZOSTER VACCINE LIVE 19400 UNT/0.65ML ~~LOC~~ SOLR: 0.6500 mL | Freq: Once | SUBCUTANEOUS | Status: DC

## 2015-06-28 ENCOUNTER — Other Ambulatory Visit: Payer: Self-pay | Admitting: Family Medicine

## 2015-08-02 ENCOUNTER — Other Ambulatory Visit: Payer: Self-pay | Admitting: Family Medicine

## 2015-09-15 ENCOUNTER — Other Ambulatory Visit: Payer: Self-pay | Admitting: Family Medicine

## 2015-10-24 ENCOUNTER — Other Ambulatory Visit: Payer: Self-pay | Admitting: Family Medicine

## 2015-12-07 ENCOUNTER — Other Ambulatory Visit: Payer: Self-pay | Admitting: Family Medicine

## 2015-12-14 ENCOUNTER — Ambulatory Visit (INDEPENDENT_AMBULATORY_CARE_PROVIDER_SITE_OTHER): Payer: BLUE CROSS/BLUE SHIELD | Admitting: Family Medicine

## 2015-12-14 ENCOUNTER — Encounter: Payer: Self-pay | Admitting: Family Medicine

## 2015-12-14 VITALS — BP 124/66 | HR 58 | Wt 155.0 lb

## 2015-12-14 DIAGNOSIS — Z114 Encounter for screening for human immunodeficiency virus [HIV]: Secondary | ICD-10-CM

## 2015-12-14 DIAGNOSIS — Z0000 Encounter for general adult medical examination without abnormal findings: Secondary | ICD-10-CM | POA: Diagnosis not present

## 2015-12-14 DIAGNOSIS — Z1159 Encounter for screening for other viral diseases: Secondary | ICD-10-CM

## 2015-12-14 LAB — COMPLETE METABOLIC PANEL WITH GFR
ALK PHOS: 41 U/L (ref 33–130)
ALT: 15 U/L (ref 6–29)
AST: 15 U/L (ref 10–35)
Albumin: 4.4 g/dL (ref 3.6–5.1)
BILIRUBIN TOTAL: 0.9 mg/dL (ref 0.2–1.2)
BUN: 17 mg/dL (ref 7–25)
CALCIUM: 9.3 mg/dL (ref 8.6–10.4)
CO2: 24 mmol/L (ref 20–31)
CREATININE: 1 mg/dL (ref 0.50–1.05)
Chloride: 108 mmol/L (ref 98–110)
GFR, Est African American: 74 mL/min (ref >=60)
GFR, Est Non African American: 64 mL/min (ref >=60)
Glucose, Bld: 101 mg/dL — ABNORMAL HIGH (ref 65–99)
Potassium: 4.7 mmol/L (ref 3.5–5.3)
Sodium: 142 mmol/L (ref 135–146)
TOTAL PROTEIN: 6.8 g/dL (ref 6.1–8.1)

## 2015-12-14 LAB — LIPID PANEL
CHOLESTEROL: 170 mg/dL (ref 125–200)
HDL: 62 mg/dL (ref >=46)
LDL Cholesterol: 97 mg/dL (ref <130)
Total CHOL/HDL Ratio: 2.7 Ratio (ref <=5.0)
Triglycerides: 53 mg/dL (ref <150)
VLDL: 11 mg/dL (ref <30)

## 2015-12-14 NOTE — Progress Notes (Signed)
  Subjective:     Elizabeth [name] is a 55 y.o. female and is here for a comprehensive physical exam. The patient reports no problems.  Social History   Social History  . Marital Status: Legally Separated    Spouse Name: N/A  . Number of Children: 0  . Years of Education: N/A   Occupational History  . VETERINARIAN    Social History Main Topics  . Smoking status: Never Smoker   . Smokeless tobacco: Never Used  . Alcohol Use: Yes     Comment: Rarely.   . Drug Use: No  . Sexual Activity: Not Currently   Other Topics Concern  . Not on file   Social History Narrative   Some regular exercise.  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   Health Maintenance  Topic Date Due  . Hepatitis C Screening  [birth year]  . HIV Screening  05/26/1977  . INFLUENZA VACCINE  12/26/2015  . MAMMOGRAM  02/11/2016  . PAP SMEAR  03/09/2017  . TETANUS/TDAP  08/06/2021  . COLONOSCOPY  02/24/2024    The following portions of the patient's history were reviewed and updated as appropriate: allergies, current medications, past family history, past medical history, past social history, past surgical history and problem list.  Review of Systems A comprehensive review of systems was negative.   Objective:    BP 124/66 mmHg  Pulse 58  Wt 155 lb (70.308 kg)  SpO2 95% General appearance: alert, cooperative and appears stated age Head: Normocephalic, without obvious abnormality, atraumatic Eyes: conj clear, EOMI, PEERLA Ears: normal TM's and external ear canals both ears Nose: Nares normal. Septum midline. Mucosa normal. No drainage or sinus tenderness. Throat: lips, mucosa, and tongue normal; teeth and gums normal Neck: no adenopathy, no carotid bruit, no JVD, supple, symmetrical, trachea midline and thyroid not enlarged, symmetric, no tenderness/mass/nodules Back: symmetric, no curvature. ROM normal. No CVA tenderness. Lungs: clear to auscultation bilaterally Heart:  regular rate and rhythm, S1, S2 normal, no murmur, click, rub or gallop Abdomen: soft, non-tender; bowel sounds normal; no masses,  no organomegaly Extremities: extremities normal, atraumatic, no cyanosis or edema Pulses: 2+ and symmetric Skin: Skin color, texture, turgor normal. No rashes or lesions Lymph nodes: Cervical adenopathy: nl and Supraclavicular adenopathy: nl Neurologic: Alert and oriented X 3, normal strength and tone. Normal symmetric reflexes. Normal coordination and gait    Assessment:    Healthy female exam.      Plan:     See After Visit Summary for Counseling Recommendations  Keep up a regular exercise program and make sure you are eating a healthy diet Try to eat 4 servings of dairy a day, or if you are lactose intolerant take a calcium with vitamin D daily.  Your vaccines are up to date.

## 2015-12-14 NOTE — Progress Notes (Deleted)
Subjective:    CC:   HPI:  Past medical history, Surgical history, Family history not pertinant except as noted below, Social history, Allergies, and medications have been entered into the medical record, reviewed, and corrections made.   Review of Systems: No fevers, chills, night sweats, weight loss, chest pain, or shortness of breath.   Objective:    General: Well Developed, well nourished, and in no acute distress.  Neuro: Alert and oriented x3, extra-ocular muscles intact, sensation grossly intact.  HEENT: Normocephalic, atraumatic  Skin: Warm and dry, no rashes. Cardiac: Regular rate and rhythm, no murmurs rubs or gallops, no lower extremity edema.  Respiratory: Clear to auscultation bilaterally. Not using accessory muscles, speaking in full sentences.   Impression and Recommendations:     

## 2015-12-14 NOTE — Patient Instructions (Signed)
Keep up a regular exercise program and make sure you are eating a healthy diet Try to eat 4 servings of dairy a day, or if you are lactose intolerant take a calcium with vitamin D daily.  Your vaccines are up to date.   

## 2015-12-15 LAB — HIV ANTIBODY (ROUTINE TESTING W REFLEX): HIV 1&2 Ab, 4th Generation: NONREACTIVE

## 2015-12-15 LAB — HEPATITIS C ANTIBODY: HCV Ab: NEGATIVE

## 2015-12-20 ENCOUNTER — Telehealth: Payer: Self-pay | Admitting: Family Medicine

## 2015-12-20 NOTE — Telephone Encounter (Signed)
Left patient a message to schedule a CPE

## 2016-01-17 ENCOUNTER — Other Ambulatory Visit: Payer: Self-pay | Admitting: Family Medicine

## 2016-03-11 DIAGNOSIS — Z1231 Encounter for screening mammogram for malignant neoplasm of breast: Secondary | ICD-10-CM | POA: Diagnosis not present

## 2016-03-11 DIAGNOSIS — Z6826 Body mass index (BMI) 26.0-26.9, adult: Secondary | ICD-10-CM | POA: Diagnosis not present

## 2016-03-11 DIAGNOSIS — N760 Acute vaginitis: Secondary | ICD-10-CM | POA: Diagnosis not present

## 2016-03-11 DIAGNOSIS — Z01419 Encounter for gynecological examination (general) (routine) without abnormal findings: Secondary | ICD-10-CM | POA: Diagnosis not present

## 2016-04-11 DIAGNOSIS — Z23 Encounter for immunization: Secondary | ICD-10-CM | POA: Diagnosis not present

## 2016-05-14 ENCOUNTER — Other Ambulatory Visit: Payer: Self-pay | Admitting: Family Medicine

## 2016-06-07 DIAGNOSIS — G2581 Restless legs syndrome: Secondary | ICD-10-CM | POA: Diagnosis not present

## 2016-06-07 DIAGNOSIS — R202 Paresthesia of skin: Secondary | ICD-10-CM | POA: Diagnosis not present

## 2016-06-07 DIAGNOSIS — Z6826 Body mass index (BMI) 26.0-26.9, adult: Secondary | ICD-10-CM | POA: Diagnosis not present

## 2016-09-09 ENCOUNTER — Other Ambulatory Visit: Payer: Self-pay | Admitting: Family Medicine

## 2016-10-22 ENCOUNTER — Other Ambulatory Visit: Payer: Self-pay | Admitting: Family Medicine

## 2016-11-28 ENCOUNTER — Encounter: Payer: Self-pay | Admitting: Family Medicine

## 2016-11-28 ENCOUNTER — Ambulatory Visit (INDEPENDENT_AMBULATORY_CARE_PROVIDER_SITE_OTHER): Payer: BLUE CROSS/BLUE SHIELD | Admitting: Family Medicine

## 2016-11-28 VITALS — BP 130/74 | HR 51 | Resp 16 | Ht 64.0 in | Wt 158.0 lb

## 2016-11-28 DIAGNOSIS — Z0000 Encounter for general adult medical examination without abnormal findings: Secondary | ICD-10-CM | POA: Diagnosis not present

## 2016-11-28 DIAGNOSIS — R7309 Other abnormal glucose: Secondary | ICD-10-CM | POA: Diagnosis not present

## 2016-11-28 DIAGNOSIS — E782 Mixed hyperlipidemia: Secondary | ICD-10-CM | POA: Diagnosis not present

## 2016-11-28 LAB — CBC
HCT: 42.6 % (ref 35.0–45.0)
HEMOGLOBIN: 14 g/dL (ref 11.7–15.5)
MCH: 30.6 pg (ref 27.0–33.0)
MCHC: 32.9 g/dL (ref 32.0–36.0)
MCV: 93 fL (ref 80.0–100.0)
MPV: 11.9 fL (ref 7.5–12.5)
PLATELETS: 180 10*3/uL (ref 140–400)
RBC: 4.58 MIL/uL (ref 3.80–5.10)
RDW: 12.7 % (ref 11.0–15.0)
WBC: 7.2 10*3/uL (ref 3.8–10.8)

## 2016-11-28 LAB — COMPLETE METABOLIC PANEL WITH GFR
ALBUMIN: 4.4 g/dL (ref 3.6–5.1)
ALK PHOS: 47 U/L (ref 33–130)
ALT: 12 U/L (ref 6–29)
AST: 14 U/L (ref 10–35)
BUN: 18 mg/dL (ref 7–25)
CALCIUM: 9.2 mg/dL (ref 8.6–10.4)
CO2: 21 mmol/L (ref 20–31)
Chloride: 109 mmol/L (ref 98–110)
Creat: 0.92 mg/dL (ref 0.50–1.05)
GFR, EST NON AFRICAN AMERICAN: 71 mL/min (ref >=60)
GFR, Est African American: 82 mL/min (ref >=60)
Glucose, Bld: 94 mg/dL (ref 65–99)
POTASSIUM: 4.2 mmol/L (ref 3.5–5.3)
SODIUM: 141 mmol/L (ref 135–146)
Total Bilirubin: 0.9 mg/dL (ref 0.2–1.2)
Total Protein: 6.5 g/dL (ref 6.1–8.1)

## 2016-11-28 LAB — TSH: TSH: 1.07 m[IU]/L

## 2016-11-28 LAB — LIPID PANEL W/REFLEX DIRECT LDL
CHOL/HDL RATIO: 2.8 ratio (ref <5.0)
CHOLESTEROL: 155 mg/dL (ref <200)
HDL: 56 mg/dL (ref >50)
LDL-Cholesterol: 86 mg/dL
Non-HDL Cholesterol (Calc): 99 mg/dL (ref <130)
TRIGLYCERIDES: 54 mg/dL (ref <150)

## 2016-11-28 LAB — T4, FREE: FREE T4: 1.2 ng/dL (ref 0.8–1.8)

## 2016-11-28 NOTE — Patient Instructions (Addendum)
Keep up a regular exercise program and make sure you are eating a healthy diet Try to eat 4 servings of dairy a day, or if you are lactose intolerant take a calcium with vitamin D daily.  Your vaccines are up to date.   

## 2016-11-28 NOTE — Progress Notes (Signed)
Subjective:     Elizabeth [name] is a 56 y.o. female and is here for a comprehensive physical exam. The patient reports no problems.  Social History   Social History  . Marital status: Legally Separated    Spouse name: N/A  . Number of children: 0  . Years of education: N/A   Occupational History  . [employer]   Social History Main Topics  . Smoking status: Never Smoker  . Smokeless tobacco: Never Used  . Alcohol use Yes     Comment: Rarely.   . Drug use: No  . Sexual activity: Not Currently   Other Topics Concern  . Not on file   Social History Narrative   Some regular exercise.  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   Health Maintenance  Topic Date Due  . MAMMOGRAM  02/11/2016  . INFLUENZA VACCINE  12/25/2016  . PAP SMEAR  03/09/2017  . TETANUS/TDAP  08/06/2021  . COLONOSCOPY  02/24/2024  . Hepatitis C Screening  Completed  . HIV Screening  Completed    The following portions of the patient's history were reviewed and updated as appropriate: allergies, current medications, past family history, past medical history, past social history, past surgical history and problem list.  Review of Systems A comprehensive review of systems was negative.   Objective:    There were no vitals taken for this visit. General appearance: alert, cooperative and appears stated age Head: Normocephalic, without obvious abnormality, atraumatic Eyes: conj clear, EOMi, PEERLA Ears: normal TM's and external ear canals both ears Nose: Nares normal. Septum midline. Mucosa normal. No drainage or sinus tenderness. Throat: lips, mucosa, and tongue normal; teeth and gums normal Neck: no adenopathy, no carotid bruit, no JVD, supple, symmetrical, trachea midline and thyroid not enlarged, symmetric, no tenderness/mass/nodules Back: symmetric, no curvature. ROM normal. No CVA tenderness. Lungs: clear to auscultation bilaterally Breasts:  normal appearance, no masses or tenderness Heart: regular rate and rhythm, S1, S2 normal, no murmur, click, rub or gallop Abdomen: soft, non-tender; bowel sounds normal; no masses,  no organomegaly Pelvic: deferred and sees gyn Extremities: extremities normal, atraumatic, no cyanosis or edema Pulses: 2+ and symmetric Skin: Skin color, texture, turgor normal. No rashes or lesions Lymph nodes: Cervical, supraclavicular, and axillary nodes normal. Neurologic: Alert and oriented X 3, normal strength and tone. Normal symmetric reflexes. Normal coordination and gait    Assessment:    Healthy female exam.     Plan:     See After Visit Summary for Counseling Recommendations   Keep up a regular exercise program and make sure you are eating a healthy diet Try to eat 4 servings of dairy a day, or if you are lactose intolerant take a calcium with vitamin D daily.  Your vaccines are up to date.  Shingrix discussed as well.

## 2016-11-29 LAB — HEMOGLOBIN A1C
Hgb A1c MFr Bld: 5.1 % (ref <5.7)
Mean Plasma Glucose: 100 mg/dL

## 2017-04-02 DIAGNOSIS — Z01419 Encounter for gynecological examination (general) (routine) without abnormal findings: Secondary | ICD-10-CM | POA: Diagnosis not present

## 2017-04-02 DIAGNOSIS — Z1151 Encounter for screening for human papillomavirus (HPV): Secondary | ICD-10-CM | POA: Diagnosis not present

## 2017-04-02 DIAGNOSIS — Z6827 Body mass index (BMI) 27.0-27.9, adult: Secondary | ICD-10-CM | POA: Diagnosis not present

## 2017-04-02 DIAGNOSIS — Z1231 Encounter for screening mammogram for malignant neoplasm of breast: Secondary | ICD-10-CM | POA: Diagnosis not present

## 2017-04-02 LAB — HM PAP SMEAR

## 2017-04-02 LAB — RESULTS CONSOLE HPV: CHL HPV: NEGATIVE

## 2017-04-07 ENCOUNTER — Other Ambulatory Visit: Payer: Self-pay | Admitting: Family Medicine

## 2017-04-09 ENCOUNTER — Telehealth: Payer: Self-pay | Admitting: Family Medicine

## 2017-04-09 DIAGNOSIS — G2581 Restless legs syndrome: Secondary | ICD-10-CM

## 2017-04-09 NOTE — Telephone Encounter (Signed)
Pt states she used to see a neurologist in [place] for management of her RLS. That Provider left and she moved care to a Provider in [facility]. That Provider is now leaving. Questions if PCP would be comfortable taking over her Rx therapy. She takes: Gabapentin 800mg  at bedtime. If not, she will need a referral to a new neurologist.

## 2017-04-10 NOTE — Telephone Encounter (Signed)
Ill be happy to  Take over her gabapentin.  Make sure have RLS on the list.

## 2017-04-11 NOTE — Telephone Encounter (Signed)
Patient advised to call back with pharmacy information and to confirm the strength and dose.

## 2017-04-23 MED ORDER — GABAPENTIN 800 MG PO TABS: 800.0000 mg | ORAL_TABLET | Freq: Every day | ORAL | 1 refills | Status: DC

## 2017-04-23 NOTE — Telephone Encounter (Signed)
New prescription faxed

## 2017-04-23 NOTE — Addendum Note (Signed)
Addended by: Narda Rutherford on: 04/23/2017 03:30 PM   Modules accepted: Orders

## 2017-04-23 NOTE — Telephone Encounter (Signed)
Patient called back and states she takes 1 tablet at bedtime of the 800 mg. [facility].

## 2017-04-23 NOTE — Addendum Note (Signed)
Addended by: Beatrice Lecher D on: 04/23/2017 03:41 PM   Modules accepted: Orders

## 2017-05-19 ENCOUNTER — Other Ambulatory Visit: Payer: Self-pay | Admitting: Family Medicine

## 2017-06-24 ENCOUNTER — Other Ambulatory Visit: Payer: Self-pay | Admitting: *Deleted

## 2017-06-24 MED ORDER — BUSPIRONE HCL 5 MG PO TABS: ORAL_TABLET | ORAL | 3 refills | Status: DC

## 2017-07-09 ENCOUNTER — Ambulatory Visit (INDEPENDENT_AMBULATORY_CARE_PROVIDER_SITE_OTHER): Payer: BLUE CROSS/BLUE SHIELD | Admitting: Family Medicine

## 2017-07-09 ENCOUNTER — Encounter: Payer: Self-pay | Admitting: Family Medicine

## 2017-07-09 VITALS — BP 163/85 | HR 63 | Wt 167.0 lb

## 2017-07-09 DIAGNOSIS — L089 Local infection of the skin and subcutaneous tissue, unspecified: Secondary | ICD-10-CM | POA: Diagnosis not present

## 2017-07-09 DIAGNOSIS — R030 Elevated blood-pressure reading, without diagnosis of hypertension: Secondary | ICD-10-CM | POA: Diagnosis not present

## 2017-07-09 DIAGNOSIS — L723 Sebaceous cyst: Secondary | ICD-10-CM | POA: Diagnosis not present

## 2017-07-09 NOTE — Patient Instructions (Signed)
Thank you for coming in today. Recheck as needed.  Consider cyst removal when not infected.  Continue wound care.    Skin Abscess A skin abscess is an infected area on or under your skin that contains a collection of pus and other material. An abscess may also be called a furuncle, carbuncle, or boil. An abscess can occur in or on almost any part of your body. Some abscesses break open (rupture) on their own. Most continue to get worse unless they are treated. The infection can spread deeper into the body and eventually into your blood, which can make you feel ill. Treatment usually involves draining the abscess. What are the causes? An abscess occurs when germs, often bacteria, pass through your skin and cause an infection. This may be caused by:  A scrape or cut on your skin.  A puncture wound through your skin, including a needle injection.  Blocked oil or sweat glands.  Blocked and infected hair follicles.  A cyst that forms beneath your skin (sebaceous cyst) and becomes infected.  What increases the risk? This condition is more likely to develop in people who:  Have a weak body defense system (immune system).  Have diabetes.  Have dry and irritated skin.  Get frequent injections or use illegal IV drugs.  Have a foreign body in a wound, such as a splinter.  Have problems with their lymph system or veins.  What are the signs or symptoms? An abscess may start as a painful, firm bump under the skin. Over time, the abscess may get larger or become softer. Pus may appear at the top of the abscess, causing pressure and pain. It may eventually break through the skin and drain. Other symptoms include:  Redness.  Warmth.  Swelling.  Tenderness.  A sore on the skin.  How is this diagnosed? This condition is diagnosed based on your medical history and a physical exam. A sample of pus may be taken from the abscess to find out what is causing the infection and what  antibiotics can be used to treat it. You also may have:  Blood tests to look for signs of infection or spread of an infection to your blood.  Imaging studies such as ultrasound, CT scan, or MRI if the abscess is deep.  How is this treated? Small abscesses that drain on their own may not need treatment. Treatment for an abscess that does not rupture on its own may include:  Warm compresses applied to the area several times per day.  Incision and drainage. Your health care provider will make an incision to open the abscess and will remove pus and any foreign body or dead tissue. The incision area may be packed with gauze to keep it open for a few days while it heals.  Antibiotic medicines to treat infection. For a severe abscess, you may first get antibiotics through an IV and then change to oral antibiotics.  Follow these instructions at home: Abscess Care  If you have an abscess that has not drained, place a warm, clean, wet washcloth over the abscess several times a day. Do this as told by your health care provider.  Follow instructions from your health care provider about how to take care of your abscess. Make sure you: ? Cover the abscess with a bandage (dressing). ? Change your dressing or gauze as told by your health care provider. ? Wash your hands with soap and water before you change the dressing or gauze. If soap  and water are not available, use hand sanitizer.  Check your abscess every day for signs of a worsening infection. Check for: ? More redness, swelling, or pain. ? More fluid or blood. ? Warmth. ? More pus or a bad smell. Medicines  Take over-the-counter and prescription medicines only as told by your health care provider.  If you were prescribed an antibiotic medicine, take it as told by your health care provider. Do not stop taking the antibiotic even if you start to feel better. General instructions  To avoid spreading the infection: ? Do not share personal  care items, towels, or hot tubs with others. ? Avoid making skin contact with other people.  Keep all follow-up visits as told by your health care provider. This is important. Contact a health care provider if:  You have more redness, swelling, or pain around your abscess.  You have more fluid or blood coming from your abscess.  Your abscess feels warm to the touch.  You have more pus or a bad smell coming from your abscess.  You have a fever.  You have muscle aches.  You have chills or a general ill feeling. Get help right away if:  You have severe pain.  You see red streaks on your skin spreading away from the abscess. This information is not intended to replace advice given to you by your health care provider. Make sure you discuss any questions you have with your health care provider. Document Released: 02/20/2005 Document Revised: 01/07/2016 Document Reviewed: 03/22/2015 Elsevier Interactive Patient Education  [employer].

## 2017-07-09 NOTE — Progress Notes (Signed)
Elizabeth [name] is a 57 y.o. female who presents to [facility] today for left axillary abscess.  Elizabeth [name] notes painful swelling in her left armpit.  This is been worsening over the past week.  She has a history of sebaceous cyst in this area that was drained about 9 years ago.  She notes is been slightly enlarging recently and became painful and largely swollen a few days ago.  She has been trying to treat with Keflex at home which has not been helpful.  She denies any fevers chills nausea vomiting or diarrhea.  She denies any breast masses or tumors.   Past Medical History:  Diagnosis Date  . Allergy   . Anxiety   . Depression   . Fibroid, uterine   . Neuromuscular disorder ([place])    RLS   Past Surgical History:  Procedure Laterality Date  . DILATION AND CURETTAGE OF UTERUS     x 2 for bleeding  . INGUINAL HERNIA REPAIR     bilat at 60month.   Marland Kitchen LASER ABLATION OF THE CERVIX    . TEAR DUCT PROBING     age 54   . TONSILLECTOMY AND ADENOIDECTOMY     age 57    Social History   Tobacco Use  . Smoking status: Never Smoker  . Smokeless tobacco: Never Used  Substance Use Topics  . Alcohol use: Yes    Comment: Rarely.    family history includes Arthritis in her father; Cancer in her brother and brother; Diabetes in her father; Heart disease in her father and mother; Hypertension in her brother and father; Kidney Stones in her brother.  ROS as above:  Medications: Current Outpatient Medications  Medication Sig Dispense Refill  . ALPRAZolam (XANAX) 0.25 MG tablet Take 1 tablet (0.25 mg total) by mouth at bedtime as needed for anxiety. 30 tablet 0  . aspirin 81 MG tablet Take 81 mg by mouth daily.    . busPIRone (BUSPAR) 5 MG tablet TAKE 1 & 1/2 (ONE & ONE-HALF) TABLETS BY MOUTH IN THE MORNING THEN TAKE  1  TABLET  AT  NOON  AND  1/2  TABLET  IN  THE  EVENING 360  tablet 3  . CAFFEINE PO Take 50 mg by mouth daily.    . Cholecalciferol (VITAMIN D-3 PO) Take 2,000 Units by mouth daily.    . Coenzyme Q10 (CO Q 10) 100 MG CAPS Take 100 mg by mouth.    . ferrous sulfate 325 (65 FE) MG tablet Take 325 mg by mouth 3 (three) times a week.    . gabapentin (NEURONTIN) 800 MG tablet Take 1 tablet (800 mg total) by mouth at bedtime. 90 tablet 1  . Glucosamine 750 MG TABS Take 1 tablet by mouth 2 (two) times daily.    Marland Kitchen loratadine (CLARITIN) 10 MG tablet Take 10 mg by mouth daily.    . Multiple Vitamin (MULTIVITAMIN) tablet Take 1 tablet by mouth daily.    . norethindrone-ethinyl estradiol (FEMHRT 1/5) 1-5 MG-MCG TABS tablet Take 1 tablet by mouth daily.    . vitamin C (ASCORBIC ACID) 500 MG tablet Take 500 mg by mouth daily. Tues,thurs,sat,sun     No current facility-administered medications for this visit.    Allergies  Allergen Reactions  . Dextromethorphan-Guaifenesin     REACTION: hives  . Fluoxetine Hcl     REACTION: skin reaction  . Wellbutrin [Bupropion] Rash  From head to toe    Health Maintenance Health Maintenance  Topic Date Due  . INFLUENZA VACCINE  03/11/2018 (Originally 12/25/2016)  . PAP SMEAR  03/10/2019  . MAMMOGRAM  04/03/2019  . TETANUS/TDAP  08/06/2021  . COLONOSCOPY  02/24/2024  . Hepatitis C Screening  Completed  . HIV Screening  Completed     Exam:  BP (!) 163/85   Pulse 63   Wt 167 lb (75.8 kg)   BMI 28.67 kg/m  Gen: Well NAD Skin: Left axillary abscess: Large about 2 cm fluctuant tender mass present in the left axilla with no surrounding skin erythema or induration.  Abscess incision and drainage. Consent obtained and timeout performed. Skin cleaned with alcohol, and cold spray applied. 5 mL of lidocaine with epi  injected achieving good anesthesia. Skin was again cleaned with alcohol. A sharp incision was made to the area of fluctuance. The incision was widened and pus was expressed. Blunt dissection was used  to break up loculations. Further pus was expressed. Patient tolerated the procedure well. A dressing was applied    No results found for this or any previous visit (from the past 72 hour(s)). No results found.    Assessment and Plan: 57 y.o. female with axillary abscess status post incision and drainage today.  This appears to be an infected sebaceous cyst.  We had a discussion that this is likely to recur and should be excised when not infected.  We discussed wound management.  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  We decided against packing today.  Additionally we discussed her hypertension.  Her blood pressure is elevated today but typically pretty well controlled.  Recommend that she continue to follow along at home and is continued to be elevated follow-up with her care provider.   No orders of the defined types were placed in this encounter.  No orders of the defined types were placed in this encounter.    Discussed warning signs or symptoms. Please see discharge instructions. Patient expresses understanding.

## 2017-09-08 ENCOUNTER — Telehealth: Payer: Self-pay

## 2017-09-08 MED ORDER — BUSPIRONE HCL 5 MG PO TABS: ORAL_TABLET | ORAL | 3 refills | Status: DC

## 2017-09-08 NOTE — Telephone Encounter (Signed)
Left message for a return call

## 2017-09-08 NOTE — Telephone Encounter (Signed)
Elizabeth [name] called and states the buspirone 5 mg directions are not correct. She states she takes buspirone 5 mg 1.5 tablets am 1 tablet at noon and 1.5 tablets in the evening. Pended medication with changed directions. Is it ok to send?

## 2017-09-08 NOTE — Telephone Encounter (Addendum)
I spoke with Elizabeth [name] and she states she has always taken the medication as noted. It was first prescribed on 03/24/2013 for 1.5 am, 1 noon, 1.5 evening. It changed on 10/25/2015. The reason she just noticed was because the pharmacy only gave her a 90 day of 270 instead of the amount prescribed of 360. Sent medication per Dr Madilyn Fireman.   busPIRone (BUSPAR) 5 MG tablet [[id number]] DISCONTINUED   Order Details  Dose, Route, Frequency: As Directed   Dispense Quantity: 120 tablet Refills: 3 Fills remaining: --        Sig: 1.5 tab in AM, 1 in afternoon, and 1.5 in PM       Discontinue Date:  07/29/2013 10:47 Discontinue User:  Beatris Ship, CMA Discontinue Reason:  Reorder  Written Date: 03/24/13 Expiration Date: 03/24/14    Start Date: 03/24/13 End Date: 07/29/13         Ordering Provider:  -- DEA #:  [id number] NPI:  --   Authorizing Provider:  Hali Marry, MD DEA #:  [id number] NPI:  [id number]   Ordering User:  Hali Marry, MD            Original Order:  busPIRone (BUSPAR) 7.5 MG tablet [[id number]]    Pharmacy:  [id number] [place], [place] DEA #:  [id number]    Pharmacy Comments:  --

## 2017-11-12 LAB — BASIC METABOLIC PANEL
BUN: 15 (ref 4–21)
Creatinine: 0.9 (ref 0.5–1.1)
GLUCOSE: 88

## 2017-11-12 LAB — HEPATIC FUNCTION PANEL
ALK PHOS: 47 (ref 25–125)
ALT: 14 (ref 7–35)
AST: 14 (ref 13–35)
BILIRUBIN, TOTAL: 0.7

## 2017-11-12 LAB — LIPID PANEL
CHOLESTEROL: 177 (ref 0–200)
HDL: 61 (ref 35–70)
LDL Cholesterol: 99
LDl/HDL Ratio: 1.6

## 2017-11-12 LAB — HM HIV SCREENING LAB: HM HIV SCREENING: NEGATIVE

## 2017-11-26 ENCOUNTER — Encounter: Payer: Self-pay | Admitting: Family Medicine

## 2017-11-26 ENCOUNTER — Ambulatory Visit (INDEPENDENT_AMBULATORY_CARE_PROVIDER_SITE_OTHER): Payer: BLUE CROSS/BLUE SHIELD | Admitting: Family Medicine

## 2017-11-26 VITALS — BP 126/69 | HR 50 | Ht 64.0 in | Wt 160.0 lb

## 2017-11-26 DIAGNOSIS — Z0000 Encounter for general adult medical examination without abnormal findings: Secondary | ICD-10-CM | POA: Diagnosis not present

## 2017-11-26 NOTE — Progress Notes (Signed)
Subjective:     Elizabeth [name] is a 57 y.o. female and is here for a comprehensive physical exam. The patient reports no problems. 3 of her pets died this year so has been a rough year.  She is doing much better.    Social History   Socioeconomic History  . Marital status: Divorced    Spouse name: Not on file  . Number of children: 0  . Years of education: Not on file  . Highest education level: Not on file  Occupational History  . Occupation: VETERINARIAN    Employer: [employer]  . Financial resource strain: Not on file  . Food insecurity:    Worry: Not on file    Inability: Not on file  . Transportation needs:    Medical: Not on file    Non-medical: Not on file  Tobacco Use  . Smoking status: Never Smoker  . Smokeless tobacco: Never Used  Substance and Sexual Activity  . Alcohol use: Yes    Comment: Rarely.   . Drug use: No  . Sexual activity: Not Currently  Lifestyle  . Physical activity:    Days per week: Not on file    Minutes per session: Not on file  . Stress: Not on file  Relationships  . Social connections:    Talks on phone: Not on file    Gets together: Not on file    Attends religious service: Not on file    Active member of club or organization: Not on file    Attends meetings of clubs or organizations: Not on file    Relationship status: Not on file  . Intimate partner violence:    Fear of current or ex partner: Not on file    Emotionally abused: Not on file    Physically abused: Not on file    Forced sexual activity: Not on file  Other Topics Concern  . Not on file  Social History Narrative   Some regular exercise.  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   Health Maintenance  Topic Date Due  . INFLUENZA VACCINE  03/11/2018 (Originally 12/25/2017)  . PAP SMEAR  03/10/2019  . MAMMOGRAM  04/03/2019  . TETANUS/TDAP  08/06/2021  . COLONOSCOPY  02/24/2024  . Hepatitis C Screening  Completed   . HIV Screening  Completed    The following portions of the patient's history were reviewed and updated as appropriate: allergies, current medications, past family history, past medical history, past social history, past surgical history and problem list.  Review of Systems A comprehensive review of systems was negative.   Objective:    BP 126/69   Pulse (!) 50   Ht 5\' 4"  (1.626 m)   Wt 160 lb (72.6 kg)   SpO2 100%   BMI 27.46 kg/m  General appearance: alert and cooperative Head: Normocephalic, without obvious abnormality, atraumatic Eyes: conj clear, EOMI, PEERLA Ears: normal TM's and external ear canals both ears Nose: Nares normal. Septum midline. Mucosa normal. No drainage or sinus tenderness. Throat: lips, mucosa, and tongue normal; teeth and gums normal Neck: no adenopathy, no carotid bruit, no JVD, supple, symmetrical, trachea midline and thyroid not enlarged, symmetric, no tenderness/mass/nodules Back: symmetric, no curvature. ROM normal. No CVA tenderness. Lungs: clear to auscultation bilaterally Heart: regular rate and rhythm, S1, S2 normal, no murmur, click, rub or gallop Abdomen: soft, non-tender; bowel sounds normal; no masses,  no organomegaly Extremities: extremities normal, atraumatic,  no cyanosis or edema Pulses: 2+ and symmetric Skin: Skin color, texture, turgor normal. No rashes or lesions Lymph nodes: Cervical adenopathy: nl and Supraclavicular adenopathy: nl Neurologic: Alert and oriented X 3, normal strength and tone. Normal symmetric reflexes. Normal coordination and gait    Assessment:    Healthy female exam.      Plan:     See After Visit Summary for Counseling Recommendations   Keep up a regular exercise program and make sure you are eating a healthy diet Try to eat 4 servings of dairy a day, or if you are lactose intolerant take a calcium with vitamin D daily.  Your vaccines are up to date.  Due for labs.

## 2017-11-26 NOTE — Patient Instructions (Signed)

## 2017-12-02 ENCOUNTER — Encounter: Payer: Self-pay | Admitting: Family Medicine

## 2017-12-08 ENCOUNTER — Other Ambulatory Visit: Payer: Self-pay | Admitting: Family Medicine

## 2017-12-08 DIAGNOSIS — G2581 Restless legs syndrome: Secondary | ICD-10-CM

## 2017-12-19 ENCOUNTER — Encounter: Payer: Self-pay | Admitting: Family Medicine

## 2018-04-08 DIAGNOSIS — N760 Acute vaginitis: Secondary | ICD-10-CM | POA: Diagnosis not present

## 2018-04-08 DIAGNOSIS — Z6828 Body mass index (BMI) 28.0-28.9, adult: Secondary | ICD-10-CM | POA: Diagnosis not present

## 2018-04-08 DIAGNOSIS — Z1231 Encounter for screening mammogram for malignant neoplasm of breast: Secondary | ICD-10-CM | POA: Diagnosis not present

## 2018-04-08 DIAGNOSIS — Z01419 Encounter for gynecological examination (general) (routine) without abnormal findings: Secondary | ICD-10-CM | POA: Diagnosis not present

## 2018-04-08 LAB — HM MAMMOGRAPHY

## 2018-05-14 ENCOUNTER — Encounter: Payer: Self-pay | Admitting: Family Medicine

## 2018-06-10 ENCOUNTER — Other Ambulatory Visit: Payer: Self-pay | Admitting: Family Medicine

## 2018-06-10 DIAGNOSIS — G2581 Restless legs syndrome: Secondary | ICD-10-CM

## 2018-08-20 ENCOUNTER — Other Ambulatory Visit: Payer: Self-pay | Admitting: Family Medicine

## 2018-08-20 ENCOUNTER — Telehealth: Payer: Self-pay | Admitting: *Deleted

## 2018-08-20 NOTE — Telephone Encounter (Signed)
lvm asking pt to rtn call to do a tele vist or webex visit for her medication refill.Marland KitchenMarland KitchenElouise [name], [place]

## 2018-08-21 NOTE — Telephone Encounter (Signed)
Her wellness exam is not a problem visit for her medications. These are separate.

## 2018-08-21 NOTE — Telephone Encounter (Signed)
Elizabeth [name] called back. I advised she needs a Webvisit. She said absolutely not. She states she only has to be seen once a year. She would like a call back from [place] or Dr Madilyn Fireman.

## 2018-08-21 NOTE — Telephone Encounter (Signed)
lvm for rtn call.Maryruth Eve, Lahoma Crocker, CMA

## 2018-08-21 NOTE — Telephone Encounter (Signed)
[name] called back and left a message stating she would rather not have to have an appointment. She wants refills on her medications.

## 2018-08-24 MED ORDER — BUSPIRONE HCL 5 MG PO TABS: 5.0000 mg | ORAL_TABLET | Freq: Two times a day (BID) | ORAL | 1 refills | Status: DC

## 2018-08-24 NOTE — Addendum Note (Signed)
Addended by: Teddy Spike on: 08/24/2018 09:05 AM   Modules accepted: Orders

## 2018-08-24 NOTE — Telephone Encounter (Signed)
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 I did tell her that if we know this information it really helps [place] to make decisions about how to handle these types of situations. She voiced understanding and informed me that she has been doing well on the medication regimen and will definitely schedule a f/u appt with Dr. Madilyn Fireman in July.Maryruth Eve, Lahoma Crocker, CMA

## 2018-08-25 MED ORDER — BUSPIRONE HCL 10 MG PO TABS: 10.0000 mg | ORAL_TABLET | Freq: Two times a day (BID) | ORAL | 1 refills | Status: DC

## 2018-08-25 MED ORDER — BUSPIRONE HCL 10 MG PO TABS: 20.0000 mg | ORAL_TABLET | Freq: Two times a day (BID) | ORAL | 1 refills | Status: DC

## 2018-08-25 NOTE — Addendum Note (Signed)
Addended by: Beatrice Lecher D on: 08/25/2018 04:11 PM   Modules accepted: Orders

## 2018-08-25 NOTE — Telephone Encounter (Signed)
Pt reports the buspar 5mg  Rx was sent with incorrect directions. She is taking 2 tablets twice daily. Routing.

## 2018-08-25 NOTE — Addendum Note (Signed)
Addended by: Teddy Spike on: 08/25/2018 04:11 PM   Modules accepted: Orders

## 2018-11-23 ENCOUNTER — Other Ambulatory Visit: Payer: Self-pay | Admitting: Family Medicine

## 2018-11-23 DIAGNOSIS — G2581 Restless legs syndrome: Secondary | ICD-10-CM

## 2018-12-02 ENCOUNTER — Other Ambulatory Visit: Payer: Self-pay

## 2018-12-02 ENCOUNTER — Encounter: Payer: Self-pay | Admitting: Family Medicine

## 2018-12-02 ENCOUNTER — Other Ambulatory Visit: Payer: Self-pay | Admitting: *Deleted

## 2018-12-02 ENCOUNTER — Ambulatory Visit (INDEPENDENT_AMBULATORY_CARE_PROVIDER_SITE_OTHER): Payer: BC Managed Care – PPO | Admitting: Family Medicine

## 2018-12-02 VITALS — BP 128/76 | HR 52 | Ht 64.0 in | Wt 168.0 lb

## 2018-12-02 DIAGNOSIS — Z0000 Encounter for general adult medical examination without abnormal findings: Secondary | ICD-10-CM | POA: Diagnosis not present

## 2018-12-02 DIAGNOSIS — Z79899 Other long term (current) drug therapy: Secondary | ICD-10-CM | POA: Diagnosis not present

## 2018-12-02 MED ORDER — ALPRAZOLAM 0.25 MG PO TABS: 0.2500 mg | ORAL_TABLET | Freq: Every day | ORAL | 0 refills | Status: DC | PRN

## 2018-12-02 MED ORDER — ALPRAZOLAM 0.25 MG PO TABS: 0.2500 mg | ORAL_TABLET | Freq: Every day | ORAL | 0 refills | Status: AC | PRN

## 2018-12-02 NOTE — Telephone Encounter (Signed)
[facility]ed [facility] and spoke w/Brittney and asked that the RX be cancelled  Will route to pcp to sign for new script [facility] [place]. Maryruth Eve, Lahoma Crocker, CMA

## 2018-12-02 NOTE — Progress Notes (Signed)
Subjective:     Elizabeth [name] is a 58 y.o. female and is here for a comprehensive physical exam. The patient reports no problems. Goes to OB/GYN  Social History   Socioeconomic History  . Marital status: Divorced    Spouse name: Not on file  . Number of children: 0  . Years of education: Not on file  . Highest education level: Not on file  Occupational History  . Occupation: VETERINARIAN    Employer: [employer]  . Financial resource strain: Not on file  . Food insecurity    Worry: Not on file    Inability: Not on file  . Transportation needs    Medical: Not on file    Non-medical: Not on file  Tobacco Use  . Smoking status: Never Smoker  . Smokeless tobacco: Never Used  Substance and Sexual Activity  . Alcohol use: Yes    Comment: Rarely.   . Drug use: No  . Sexual activity: Not Currently  Lifestyle  . Physical activity    Days per week: Not on file    Minutes per session: Not on file  . Stress: Not on file  Relationships  . Social Herbalist on phone: Not on file    Gets together: Not on file    Attends religious service: Not on file    Active member of club or organization: Not on file    Attends meetings of clubs or organizations: Not on file    Relationship status: Not on file  . Intimate partner violence    Fear of current or ex partner: Not on file    Emotionally abused: Not on file    Physically abused: Not on file    Forced sexual activity: Not on file  Other Topics Concern  . Not on file  Social History Narrative   Some regular exercise.  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   Health Maintenance  Topic Date Due  . INFLUENZA VACCINE  12/26/2018  . MAMMOGRAM  04/08/2020  . TETANUS/TDAP  08/06/2021  . PAP SMEAR-Modifier  04/03/2022  . COLONOSCOPY  02/24/2024  . Hepatitis C Screening  Completed  . HIV Screening  Completed    The following portions of the patient's history  were reviewed and updated as appropriate: allergies, current medications, past family history, past medical history, past social history, past surgical history and problem list.  Review of Systems A comprehensive review of systems was negative.   Objective:    BP 128/76   Pulse (!) 52   Ht 5\' 4"  (1.626 m)   Wt 168 lb (76.2 kg)   SpO2 100%   BMI 28.84 kg/m  General appearance: alert, cooperative and appears stated age Head: Normocephalic, without obvious abnormality, atraumatic Eyes: conj claer, EOMI, PEERLA Ears: normal TM's and external ear canals both ears Nose: Nares normal. Septum midline. Mucosa normal. No drainage or sinus tenderness. Throat: lips, mucosa, and tongue normal; teeth and gums normal Neck: no adenopathy, no carotid bruit, no JVD, supple, symmetrical, trachea midline and thyroid not enlarged, symmetric, no tenderness/mass/nodules Back: symmetric, no curvature. ROM normal. No CVA tenderness. Lungs: clear to auscultation bilaterally Heart: regular rate and rhythm, S1, S2 normal, no murmur, click, rub or gallop Abdomen: soft, non-tender; bowel sounds normal; no masses,  no organomegaly Extremities: extremities normal, atraumatic, no cyanosis or edema Pulses: 2+ and symmetric Skin: Skin color, texture, turgor normal. No rashes or  lesions Lymph nodes: Cervical, supraclavicular, and axillary nodes normal. Neurologic: Alert and oriented X 3, normal strength and tone. Normal symmetric reflexes. Normal coordination and gait    Assessment:    Healthy female exam.      Plan:     See After Visit Summary for Counseling Recommendations   Keep up a regular exercise program and make sure you are eating a healthy diet Try to eat 4 servings of dairy a day, or if you are lactose intolerant take a calcium with vitamin D daily.  Your vaccines are up to date.  Declines shingles vaccine.

## 2018-12-02 NOTE — Patient Instructions (Signed)
Exercising to Stay Healthy To become healthy and stay healthy, it is recommended that you do moderate-intensity and vigorous-intensity exercise. You can tell that you are exercising at a moderate intensity if your heart starts beating faster and you start breathing faster but can still hold a conversation. You can tell that you are exercising at a vigorous intensity if you are breathing much harder and faster and cannot hold a conversation while exercising. Exercising regularly is important. It has many health benefits, such as:  Improving overall fitness, flexibility, and endurance.  Increasing bone density.  Helping with weight control.  Decreasing body fat.  Increasing muscle strength.  Reducing stress and tension.  Improving overall health. How often should I exercise? Choose an activity that you enjoy, and set realistic goals. Your health care provider can help you make an activity plan that works for you. Exercise regularly as told by your health care provider. This may include:  Doing strength training two times a week, such as: ? Lifting weights. ? Using resistance bands. ? Push-ups. ? Sit-ups. ? Yoga.  Doing a certain intensity of exercise for a given amount of time. Choose from these options: ? A total of 150 minutes of moderate-intensity exercise every week. ? A total of 75 minutes of vigorous-intensity exercise every week. ? A mix of moderate-intensity and vigorous-intensity exercise every week. Children, pregnant women, people who have not exercised regularly, people who are overweight, and older adults may need to talk with a health care provider about what activities are safe to do. If you have a medical condition, be sure to talk with your health care provider before you start a new exercise program. What are some exercise ideas? Moderate-intensity exercise ideas include:  Walking 1 mile (1.6 km) in about 15 minutes.  Biking.  Hiking.  Golfing.  Dancing.   Water aerobics. Vigorous-intensity exercise ideas include:  Walking 4.5 miles (7.2 km) or more in about 1 hour.  Jogging or running 5 miles (8 km) in about 1 hour.  Biking 10 miles (16.1 km) or more in about 1 hour.  Lap swimming.  Roller-skating or in-line skating.  Cross-country skiing.  Vigorous competitive sports, such as football, basketball, and soccer.  Jumping rope.  Aerobic dancing. What are some everyday activities that can help me to get exercise?  Yard work, such as: ? Pushing a lawn mower. ? Raking and bagging leaves.  Washing your car.  Pushing a stroller.  Shoveling snow.  Gardening.  Washing windows or floors. How can I be more active in my day-to-day activities?  Use stairs instead of an elevator.  Take a walk during your lunch break.  If you drive, park your car farther away from your work or school.  If you take public transportation, get off one stop early and walk the rest of the way.  Stand up or walk around during all of your indoor phone calls.  Get up, stretch, and walk around every 30 minutes throughout the day.  Enjoy exercise with a friend. Support to continue exercising will help you keep a regular routine of activity. What guidelines can I follow while exercising?  Before you start a new exercise program, talk with your health care provider.  Do not exercise so much that you hurt yourself, feel dizzy, or get very short of breath.  Wear comfortable clothes and wear shoes with good support.  Drink plenty of water while you exercise to prevent dehydration or heat stroke.  Work out until your breathing   and your heartbeat get faster. Where to find more information  [place] Department of Health and Human Services: www.hhs.gov  Centers for Disease Control and Prevention (CDC): www.cdc.gov Summary  Exercising regularly is important. It will improve your overall fitness, flexibility, and endurance.  Regular exercise also will  improve your overall health. It can help you control your weight, reduce stress, and improve your bone density.  Do not exercise so much that you hurt yourself, feel dizzy, or get very short of breath.  Before you start a new exercise program, talk with your health care provider. This information is not intended to replace advice given to you by your health care provider. Make sure you discuss any questions you have with your health care provider. Document Released: 06/15/2010 Document Revised: 04/25/2017 Document Reviewed: 04/03/2017 Elsevier Patient Education  2020 Elsevier Inc.  

## 2018-12-02 NOTE — Addendum Note (Signed)
Addended by: Teddy Spike on: 12/02/2018 09:29 AM   Modules accepted: Orders

## 2018-12-03 LAB — COMPLETE METABOLIC PANEL WITH GFR
AG Ratio: 2.1 (calc) (ref 1.0–2.5)
ALT: 25 U/L (ref 6–29)
AST: 19 U/L (ref 10–35)
Albumin: 4.4 g/dL (ref 3.6–5.1)
Alkaline phosphatase (APISO): 48 U/L (ref 37–153)
BUN: 16 mg/dL (ref 7–25)
CO2: 26 mmol/L (ref 20–32)
Calcium: 9.4 mg/dL (ref 8.6–10.4)
Chloride: 109 mmol/L (ref 98–110)
Creat: 0.89 mg/dL (ref 0.50–1.05)
GFR, Est African American: 84 mL/min/{1.73_m2} (ref >=60)
GFR, Est Non African American: 72 mL/min/{1.73_m2} (ref >=60)
Globulin: 2.1 g/dL (calc) (ref 1.9–3.7)
Glucose, Bld: 100 mg/dL — ABNORMAL HIGH (ref 65–99)
Potassium: 4 mmol/L (ref 3.5–5.3)
Sodium: 141 mmol/L (ref 135–146)
Total Bilirubin: 0.7 mg/dL (ref 0.2–1.2)
Total Protein: 6.5 g/dL (ref 6.1–8.1)

## 2018-12-03 LAB — LIPID PANEL
Cholesterol: 172 mg/dL (ref <200)
HDL: 57 mg/dL (ref >=50)
LDL Cholesterol (Calc): 100 mg/dL (calc) — ABNORMAL HIGH
Non-HDL Cholesterol (Calc): 115 mg/dL (calc) (ref <130)
Total CHOL/HDL Ratio: 3 (calc) (ref <5.0)
Triglycerides: 63 mg/dL (ref <150)

## 2018-12-03 LAB — CBC
HCT: 41.4 % (ref 35.0–45.0)
Hemoglobin: 13.9 g/dL (ref 11.7–15.5)
MCH: 31.1 pg (ref 27.0–33.0)
MCHC: 33.6 g/dL (ref 32.0–36.0)
MCV: 92.6 fL (ref 80.0–100.0)
MPV: 12 fL (ref 7.5–12.5)
Platelets: 198 10*3/uL (ref 140–400)
RBC: 4.47 10*6/uL (ref 3.80–5.10)
RDW: 11.6 % (ref 11.0–15.0)
WBC: 6.6 10*3/uL (ref 3.8–10.8)

## 2018-12-03 LAB — TSH: TSH: 1.3 mIU/L (ref 0.40–4.50)

## 2019-02-17 ENCOUNTER — Other Ambulatory Visit: Payer: Self-pay | Admitting: Family Medicine

## 2019-02-17 DIAGNOSIS — G2581 Restless legs syndrome: Secondary | ICD-10-CM

## 2019-04-14 DIAGNOSIS — Z6829 Body mass index (BMI) 29.0-29.9, adult: Secondary | ICD-10-CM | POA: Diagnosis not present

## 2019-04-14 DIAGNOSIS — Z1231 Encounter for screening mammogram for malignant neoplasm of breast: Secondary | ICD-10-CM | POA: Diagnosis not present

## 2019-04-14 DIAGNOSIS — Z01419 Encounter for gynecological examination (general) (routine) without abnormal findings: Secondary | ICD-10-CM | POA: Diagnosis not present

## 2019-04-14 LAB — HM MAMMOGRAPHY

## 2019-04-20 NOTE — Telephone Encounter (Signed)
Patient called and left a message about insurance. I left her a message to call her insurance and see if her PCP was in network. Patient was asked to call back with any questions.

## 2019-04-27 DIAGNOSIS — Z8616 Personal history of COVID-19: Secondary | ICD-10-CM

## 2019-04-27 HISTORY — DX: Personal history of COVID-19: Z86.16

## 2019-04-30 ENCOUNTER — Other Ambulatory Visit: Payer: Self-pay

## 2019-04-30 ENCOUNTER — Emergency Department (INDEPENDENT_AMBULATORY_CARE_PROVIDER_SITE_OTHER)
Admission: EM | Admit: 2019-04-30 | Discharge: 2019-04-30 | Disposition: A | Discharge status: Home / Self Care | Payer: BC Managed Care – PPO | Source: Home / Self Care

## 2019-04-30 ENCOUNTER — Telehealth: Payer: Self-pay | Admitting: Emergency Medicine

## 2019-04-30 ENCOUNTER — Encounter: Payer: Self-pay | Admitting: Emergency Medicine

## 2019-04-30 DIAGNOSIS — U071 COVID-19: Secondary | ICD-10-CM | POA: Diagnosis not present

## 2019-04-30 NOTE — ED Triage Notes (Signed)
Last night headache, fever 103, jaw pain, scratchy throat, chest pressure x 1 day

## 2019-04-30 NOTE — Telephone Encounter (Signed)
You are contagious 2-3 days before symptoms and a solid 5 days after.

## 2019-04-30 NOTE — ED Provider Notes (Signed)
[name]    CSN: [id number] Arrival date & time: 04/30/19  1119      History   Chief Complaint Chief Complaint  Patient presents with  . Headache    HPI Elizabeth [name] is a 58 y.o. female.   58 year old female, with history of depression, anxiety, presenting today due to Covid symptoms.  States that she has had fever, chills, headache, dry throat and nonproductive cough that started last night.  T-max of 102.5.  Has taken Tylenol and ibuprofen with good improvement of her symptoms.  She is unsure of recent exposure.  Denies any chest pain or shortness of breath.  No abdominal pain, nausea, vomiting or diarrhea.  No loss of taste or smell.  The history is provided by the patient.  Headache Pain location:  Generalized Quality:  Dull Radiates to:  Does not radiate Severity currently:  6/10 Severity at highest:  6/10 Onset quality:  Gradual Duration:  1 day Timing:  Constant Progression:  Unchanged Chronicity:  New Context: not activity, not exposure to bright light, not caffeine and not coughing   Relieved by:  Nothing Worsened by:  Nothing Ineffective treatments:  None tried Associated symptoms: congestion, cough, fatigue, fever, myalgias and sore throat   Associated symptoms: no abdominal pain, no back pain, no blurred vision, no diarrhea, no dizziness, no drainage, no ear pain, no eye pain, no facial pain, no focal weakness, no hearing loss, no loss of balance, no nausea, no near-syncope, no neck pain, no neck stiffness, no numbness, no paresthesias, no photophobia, no seizures, no sinus pressure, no swollen glands, no syncope, no tingling, no URI, no visual change, no vomiting and no weakness   Risk factors: no anger, no family hx of SAH, does not have insomnia and lifestyle not sedentary     Past Medical History:  Diagnosis Date  . Allergy   . Anxiety   . Depression   . Fibroid, uterine   . Neuromuscular disorder ([place])    RLS    Patient Active  Problem List   Diagnosis Date Noted  . Snoring 07/15/2011  . RLS (restless legs syndrome) 05/15/2011  . UNSPECIFIED VITAMIN D DEFICIENCY 04/04/2010  . PARESTHESIA 12/13/2009  . DEPRESSION 08/17/2008  . INSOMNIA 08/17/2008  . ALLERGIC RHINITIS 09/16/2007  . IMPAIRED FASTING GLUCOSE 09/16/2007  . ANXIETY STATE, UNSPECIFIED 07/15/2007    Past Surgical History:  Procedure Laterality Date  . DILATION AND CURETTAGE OF UTERUS     x 2 for bleeding  . INGUINAL HERNIA REPAIR     bilat at 58month.   Marland Kitchen LASER ABLATION OF THE CERVIX    . TEAR DUCT PROBING     age 82   . TONSILLECTOMY AND ADENOIDECTOMY     age 37     OB History   No obstetric history on file.      Home Medications    Prior to Admission medications   Medication Sig Start Date End Date Taking? Authorizing Provider  Ascorbic Acid (VITAMIN C) 1000 MG tablet Take 1 tablet by mouth daily.    [provider]  aspirin 81 MG tablet Take 81 mg by mouth daily.    [provider]  busPIRone (BUSPAR) 10 MG tablet Take 2 tablets (20 mg total) by mouth 2 (two) times daily. 08/25/18   Hali Marry, MD  CAFFEINE PO Take 50 mg by mouth daily.    [provider]  Cholecalciferol (VITAMIN D-3 PO) Take 2,000 Units by mouth daily.  [provider]  Coenzyme Q10 (CO Q 10) 100 MG CAPS Take 100 mg by mouth.    [provider]  ferrous sulfate 325 (65 FE) MG tablet Take 325 mg by mouth 3 (three) times a week.    [provider]  gabapentin (NEURONTIN) 800 MG tablet TAKE 1 TABLET BY MOUTH AT BEDTIME 02/17/19   Hali Marry, MD  Glucosamine 750 MG TABS Take 1 tablet by mouth 2 (two) times daily.    [provider]  loratadine (CLARITIN) 10 MG tablet Take 10 mg by mouth daily.    [provider]  Multiple Vitamin (MULTIVITAMIN) tablet Take 1 tablet by mouth daily.    [provider]  norethindrone-ethinyl estradiol (FEMHRT 1/5) 1-5 MG-MCG TABS tablet  Take 1 tablet by mouth daily.    [provider]  Norethindrone Acet-Ethinyl Est (LOESTRIN 1.5/30, 21, PO) Take by mouth.   08/07/11  [provider]    Family History Family History  Problem Relation Age of Onset  . Hypertension Father   . Heart disease Father   . Arthritis Father   . Diabetes Father   . Heart disease Mother        Septal defect   . Cancer Brother        leiiomyoscarcoma  . Hypertension Brother   . Cancer Brother        giant cell tumor.   . Kidney Stones Brother   . Colon cancer Neg Hx     Social History Social History   Tobacco Use  . Smoking status: Never Smoker  . Smokeless tobacco: Never Used  Substance Use Topics  . Alcohol use: Yes    Comment: Rarely.   . Drug use: No     Allergies   Dextromethorphan-guaifenesin, Fluoxetine hcl, and Wellbutrin [bupropion]   Review of Systems Review of Systems  Constitutional: Positive for chills, fatigue and fever.  HENT: Positive for congestion and sore throat. Negative for ear pain, hearing loss, postnasal drip and sinus pressure.   Eyes: Negative for blurred vision, photophobia, pain and visual disturbance.  Respiratory: Positive for cough. Negative for shortness of breath.   Cardiovascular: Negative for chest pain, palpitations, syncope and near-syncope.  Gastrointestinal: Negative for abdominal pain, diarrhea, nausea and vomiting.  Genitourinary: Negative for dysuria and hematuria.  Musculoskeletal: Positive for myalgias. Negative for arthralgias, back pain, neck pain and neck stiffness.  Skin: Negative for color change and rash.  Neurological: Positive for headaches. Negative for dizziness, focal weakness, seizures, syncope, weakness, numbness, paresthesias and loss of balance.  All other systems reviewed and are negative.    Physical Exam Triage Vital Signs ED Triage Vitals  Enc Vitals Group     BP 04/30/19 1142 136/87     Pulse Rate 04/30/19 1142 82     Resp --      Temp  04/30/19 1142 100 F (37.8 C)     Temp Source 04/30/19 1142 Oral     SpO2 04/30/19 1142 99 %     Weight 04/30/19 1144 170 lb (77.1 kg)     Height 04/30/19 1144 5\' 4"  (1.626 m)     Head Circumference --      Peak Flow --      Pain Score 04/30/19 1143 2     Pain Loc --      Pain Edu? --      Excl. in [place]? --    No data found.  Updated Vital Signs BP 136/87 (BP  Location: Right Arm)   Pulse 82   Temp 100 F (37.8 C) (Oral)   Ht 5\' 4"  (1.626 m)   Wt 170 lb (77.1 kg)   SpO2 99%   BMI 29.18 kg/m   Visual Acuity Right Eye Distance:   Left Eye Distance:   Bilateral Distance:    Right Eye Near:   Left Eye Near:    Bilateral Near:     Physical Exam Vitals signs and nursing note reviewed.  Constitutional:      General: She is not in acute distress.    Appearance: She is well-developed.  HENT:     Head: Normocephalic and atraumatic.     Right Ear: Hearing, tympanic membrane, ear canal and external ear normal.     Left Ear: Hearing, tympanic membrane, ear canal and external ear normal.     Nose: Nose normal.     Mouth/Throat:     Pharynx: No oropharyngeal exudate or posterior oropharyngeal erythema.     Tonsils: No tonsillar abscesses.  Eyes:     Conjunctiva/sclera: Conjunctivae normal.  Neck:     Musculoskeletal: Neck supple.  Cardiovascular:     Rate and Rhythm: Normal rate and regular rhythm.     Pulses: Normal pulses.     Heart sounds: No murmur.  Pulmonary:     Effort: Pulmonary effort is normal. No respiratory distress.     Breath sounds: Normal breath sounds. No stridor. No decreased breath sounds, wheezing, rhonchi or rales.  Abdominal:     Palpations: Abdomen is soft.     Tenderness: There is no abdominal tenderness.  Skin:    General: Skin is warm and dry.  Neurological:     Mental Status: She is alert.      UC Treatments / Results  Labs (all labs ordered are listed, but only abnormal results are displayed) Labs Reviewed  POC SARS CORONAVIRUS 2 AG  -  ED    EKG   Radiology No results found.  Procedures Procedures (including critical care time)  Medications Ordered in UC Medications - No data to display  Initial Impression / Assessment and Plan / UC Course  I have reviewed the triage vital signs and the nursing notes.  Pertinent labs & imaging results that were available during my care of the patient were reviewed by me and considered in my medical decision making (see chart for details).     Rapid Covid swab positive.  Patient is well-appearing with a room air saturation of 99%.  Discussed prednisone and albuterol.  She feels that she will be okay without medications at this time and will treat with over-the-counter medications for fever and body aches.  Return cautions discussed Final Clinical Impressions(s) / UC Diagnoses   Final diagnoses:  [id number]   Discharge Instructions   None    ED Prescriptions    None     PDMP not reviewed this encounter.   Phebe Colla, [place] 04/30/19 1247

## 2019-05-06 ENCOUNTER — Encounter: Payer: Self-pay | Admitting: Family Medicine

## 2019-05-17 ENCOUNTER — Other Ambulatory Visit: Payer: Self-pay | Admitting: Family Medicine

## 2019-05-17 DIAGNOSIS — G2581 Restless legs syndrome: Secondary | ICD-10-CM

## 2019-08-16 ENCOUNTER — Other Ambulatory Visit: Payer: Self-pay

## 2019-08-16 DIAGNOSIS — G2581 Restless legs syndrome: Secondary | ICD-10-CM

## 2019-08-16 MED ORDER — GABAPENTIN 800 MG PO TABS: 800.0000 mg | ORAL_TABLET | Freq: Every day | ORAL | 1 refills | Status: DC

## 2019-08-16 MED ORDER — BUSPIRONE HCL 10 MG PO TABS: 20.0000 mg | ORAL_TABLET | Freq: Two times a day (BID) | ORAL | 1 refills | Status: DC

## 2019-12-08 ENCOUNTER — Encounter: Payer: Self-pay | Admitting: Family Medicine

## 2019-12-08 ENCOUNTER — Ambulatory Visit (INDEPENDENT_AMBULATORY_CARE_PROVIDER_SITE_OTHER): Payer: 59 | Admitting: Family Medicine

## 2019-12-08 VITALS — BP 129/70 | HR 55 | Ht 64.0 in | Wt 170.0 lb

## 2019-12-08 DIAGNOSIS — R4586 Emotional lability: Secondary | ICD-10-CM

## 2019-12-08 DIAGNOSIS — Z0000 Encounter for general adult medical examination without abnormal findings: Secondary | ICD-10-CM | POA: Diagnosis not present

## 2019-12-08 MED ORDER — BUSPIRONE HCL 10 MG PO TABS: 10.0000 mg | ORAL_TABLET | Freq: Two times a day (BID) | ORAL | 0 refills | Status: DC

## 2019-12-08 NOTE — Patient Instructions (Signed)
Health Maintenance, Female Adopting a healthy lifestyle and getting preventive care are important in promoting health and wellness. Ask your health care provider about:  The right schedule for you to have regular tests and exams.  Things you can do on your own to prevent diseases and keep yourself healthy. What should I know about diet, weight, and exercise? Eat a healthy diet   Eat a diet that includes plenty of vegetables, fruits, low-fat dairy products, and lean protein.  Do not eat a lot of foods that are high in solid fats, added sugars, or sodium. Maintain a healthy weight Body mass index (BMI) is used to identify weight problems. It estimates body fat based on height and weight. Your health care provider can help determine your BMI and help you achieve or maintain a healthy weight. Get regular exercise Get regular exercise. This is one of the most important things you can do for your health. Most adults should:  Exercise for at least 150 minutes each week. The exercise should increase your heart rate and make you sweat (moderate-intensity exercise).  Do strengthening exercises at least twice a week. This is in addition to the moderate-intensity exercise.  Spend less time sitting. Even light physical activity can be beneficial. Watch cholesterol and blood lipids Have your blood tested for lipids and cholesterol at 58 years of age, then have this test every 5 years. Have your cholesterol levels checked more often if:  Your lipid or cholesterol levels are high.  You are older than 58 years of age.  You are at high risk for heart disease. What should I know about cancer screening? Depending on your health history and family history, you may need to have cancer screening at various ages. This may include screening for:  Breast cancer.  Cervical cancer.  Colorectal cancer.  Skin cancer.  Lung cancer. What should I know about heart disease, diabetes, and high blood  pressure? Blood pressure and heart disease  High blood pressure causes heart disease and increases the risk of stroke. This is more likely to develop in people who have high blood pressure readings, are of African descent, or are overweight.  Have your blood pressure checked: ? Every 3-5 years if you are 18-39 years of age. ? Every year if you are 40 years old or older. Diabetes Have regular diabetes screenings. This checks your fasting blood sugar level. Have the screening done:  Once every three years after age 40 if you are at a normal weight and have a low risk for diabetes.  More often and at a younger age if you are overweight or have a high risk for diabetes. What should I know about preventing infection? Hepatitis B If you have a higher risk for hepatitis B, you should be screened for this virus. Talk with your health care provider to find out if you are at risk for hepatitis B infection. Hepatitis C Testing is recommended for:  Everyone born from 1945 through 1965.  Anyone with known risk factors for hepatitis C. Sexually transmitted infections (STIs)  Get screened for STIs, including gonorrhea and chlamydia, if: ? You are sexually active and are younger than 58 years of age. ? You are older than 58 years of age and your health care provider tells you that you are at risk for this type of infection. ? Your sexual activity has changed since you were last screened, and you are at increased risk for chlamydia or gonorrhea. Ask your health care provider if   you are at risk.  Ask your health care provider about whether you are at high risk for HIV. Your health care provider may recommend a prescription medicine to help prevent HIV infection. If you choose to take medicine to prevent HIV, you should first get tested for HIV. You should then be tested every 3 months for as long as you are taking the medicine. Pregnancy  If you are about to stop having your period (premenopausal) and  you may become pregnant, seek counseling before you get pregnant.  Take 400 to 800 micrograms (mcg) of folic acid every day if you become pregnant.  Ask for birth control (contraception) if you want to prevent pregnancy. Osteoporosis and menopause Osteoporosis is a disease in which the bones lose minerals and strength with aging. This can result in bone fractures. If you are 65 years old or older, or if you are at risk for osteoporosis and fractures, ask your health care provider if you should:  Be screened for bone loss.  Take a calcium or vitamin D supplement to lower your risk of fractures.  Be given hormone replacement therapy (HRT) to treat symptoms of menopause. Follow these instructions at home: Lifestyle  Do not use any products that contain nicotine or tobacco, such as cigarettes, e-cigarettes, and chewing tobacco. If you need help quitting, ask your health care provider.  Do not use street drugs.  Do not share needles.  Ask your health care provider for help if you need support or information about quitting drugs. Alcohol use  Do not drink alcohol if: ? Your health care provider tells you not to drink. ? You are pregnant, may be pregnant, or are planning to become pregnant.  If you drink alcohol: ? Limit how much you use to 0-1 drink a day. ? Limit intake if you are breastfeeding.  Be aware of how much alcohol is in your drink. In the [place] one drink equals one 12 oz bottle of beer (355 mL), one 5 oz glass of wine (148 mL), or one 1 oz glass of hard liquor (44 mL). General instructions  Schedule regular health, dental, and eye exams.  Stay current with your vaccines.  Tell your health care provider if: ? You often feel depressed. ? You have ever been abused or do not feel safe at home. Summary  Adopting a healthy lifestyle and getting preventive care are important in promoting health and wellness.  Follow your health care provider's instructions about healthy  diet, exercising, and getting tested or screened for diseases.  Follow your health care provider's instructions on monitoring your cholesterol and blood pressure. This information is not intended to replace advice given to you by your health care provider. Make sure you discuss any questions you have with your health care provider. Document Revised: 05/06/2018 Document Reviewed: 05/06/2018 Elsevier Patient Education  2020 [employer] Inc.  

## 2019-12-08 NOTE — Progress Notes (Signed)
Subjective:     Elizabeth [name] is a 59 y.o. female and is here for a comprehensive physical exam. The patient reports no problems.  She is doing well overall [name].  Last seen about a year ago.  She is actually been working about 6 days a week so has not had much time for regular exercise but she does stay very physically active at work.  She did want to let me know that on the BuSpar she is taking 10 mg twice a day as we need to correct that on her medication list.  Social History   Socioeconomic History  . Marital status: Divorced    Spouse name: Not on file  . Number of children: 0  . Years of education: Not on file  . Highest education level: Not on file  Occupational History  . Occupation: VETERINARIAN    Employer: [employer]  Tobacco Use  . Smoking status: Never Smoker  . Smokeless tobacco: Never Used  Vaping Use  . Vaping Use: Never used  Substance and Sexual Activity  . Alcohol use: Yes    Comment: Rarely.   . Drug use: No  . Sexual activity: Not Currently  Other Topics Concern  . Not on file  Social History Narrative   Some regular exercise.  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   Social Determinants of Health   Financial Resource Strain:   . Difficulty of Paying Living Expenses:   Food Insecurity:   . Worried About Charity fundraiser in the Last Year:   . Arboriculturist in the Last Year:   Transportation Needs:   . Film/video editor (Medical):   Marland Kitchen Lack of Transportation (Non-Medical):   Physical Activity:   . Days of Exercise per Week:   . Minutes of Exercise per Session:   Stress:   . Feeling of Stress :   Social Connections:   . Frequency of Communication with Friends and Family:   . Frequency of Social Gatherings with Friends and Family:   . Attends Religious Services:   . Active Member of Clubs or Organizations:   . Attends Archivist Meetings:   Marland Kitchen Marital Status:   Intimate Partner  Violence:   . Fear of Current or Ex-Partner:   . Emotionally Abused:   Marland Kitchen Physically Abused:   . Sexually Abused:    Health Maintenance  Topic Date Due  . INFLUENZA VACCINE  12/26/2019  . MAMMOGRAM  04/13/2021  . TETANUS/TDAP  08/06/2021  . PAP SMEAR-Modifier  04/03/2022  . COLONOSCOPY  02/24/2024  . COVID-19 Vaccine  Completed  . Hepatitis C Screening  Completed  . HIV Screening  Completed    The following portions of the patient's history were reviewed and updated as appropriate: allergies, current medications, past family history, past medical history, past social history, past surgical history and problem list.  Review of Systems A comprehensive review of systems was negative.   Objective:    BP 129/70   Pulse (!) 55   Ht 5\' 4"  (1.626 m)   Wt 170 lb (77.1 kg)   SpO2 100%   BMI 29.18 kg/m  General appearance: alert, cooperative and appears stated age Head: Normocephalic, without obvious abnormality, atraumatic Eyes: conj clear, EOMi, PEERLA Ears: normal TM's and external ear canals both ears Nose: Nares normal. Septum midline. Mucosa normal. No drainage or sinus tenderness. Throat: lips, mucosa, and tongue normal; teeth and gums normal  Neck: no adenopathy, no carotid bruit, no JVD, supple, symmetrical, trachea midline and thyroid not enlarged, symmetric, no tenderness/mass/nodules Back: symmetric, no curvature. ROM normal. No CVA tenderness. Lungs: clear to auscultation bilaterally Heart: regular rate and rhythm, S1, S2 normal, no murmur, click, rub or gallop Abdomen: soft, non-tender; bowel sounds normal; no masses,  no organomegaly Extremities: extremities normal, atraumatic, no cyanosis or edema Pulses: 2+ and symmetric Skin: Skin color, texture, turgor normal. No rashes or lesions Lymph nodes: Cervical, supraclavicular, and axillary nodes normal. Neurologic: Alert and oriented X 3, normal strength and tone. Normal symmetric reflexes. Normal coordination and gait     Assessment:    Healthy female exam.     Plan:     See After Visit Summary for Counseling Recommendations   Keep up a regular exercise program and make sure you are eating a healthy diet Try to eat 4 servings of dairy a day, or if you are lactose intolerant take a calcium with vitamin D daily.  Your vaccines are up to date.   She would like to reestablish with Juliann Pulse who she saw years ago for therapy/counseling.  New referral placed.

## 2019-12-09 LAB — HEMOGLOBIN A1C
Hgb A1c MFr Bld: 5 % of total Hgb (ref <5.7)
Mean Plasma Glucose: 97 (calc)
eAG (mmol/L): 5.4 (calc)

## 2019-12-09 LAB — CBC
HCT: 45.2 % — ABNORMAL HIGH (ref 35.0–45.0)
Hemoglobin: 14.7 g/dL (ref 11.7–15.5)
MCH: 29.9 pg (ref 27.0–33.0)
MCHC: 32.5 g/dL (ref 32.0–36.0)
MCV: 92.1 fL (ref 80.0–100.0)
MPV: 11.7 fL (ref 7.5–12.5)
Platelets: 224 10*3/uL (ref 140–400)
RBC: 4.91 10*6/uL (ref 3.80–5.10)
RDW: 11.3 % (ref 11.0–15.0)
WBC: 5.6 10*3/uL (ref 3.8–10.8)

## 2019-12-09 LAB — COMPLETE METABOLIC PANEL WITH GFR
AG Ratio: 2.1 (calc) (ref 1.0–2.5)
ALT: 28 U/L (ref 6–29)
AST: 20 U/L (ref 10–35)
Albumin: 4.6 g/dL (ref 3.6–5.1)
Alkaline phosphatase (APISO): 51 U/L (ref 37–153)
BUN/Creatinine Ratio: 15 (calc) (ref 6–22)
BUN: 16 mg/dL (ref 7–25)
CO2: 26 mmol/L (ref 20–32)
Calcium: 9.5 mg/dL (ref 8.6–10.4)
Chloride: 108 mmol/L (ref 98–110)
Creat: 1.09 mg/dL — ABNORMAL HIGH (ref 0.50–1.05)
GFR, Est African American: 65 mL/min/{1.73_m2} (ref >=60)
GFR, Est Non African American: 56 mL/min/{1.73_m2} — ABNORMAL LOW (ref >=60)
Globulin: 2.2 g/dL (calc) (ref 1.9–3.7)
Glucose, Bld: 86 mg/dL (ref 65–99)
Potassium: 4.8 mmol/L (ref 3.5–5.3)
Sodium: 142 mmol/L (ref 135–146)
Total Bilirubin: 1 mg/dL (ref 0.2–1.2)
Total Protein: 6.8 g/dL (ref 6.1–8.1)

## 2019-12-09 LAB — LIPID PANEL
Cholesterol: 177 mg/dL (ref <200)
HDL: 56 mg/dL (ref >=50)
LDL Cholesterol (Calc): 106 mg/dL (calc) — ABNORMAL HIGH
Non-HDL Cholesterol (Calc): 121 mg/dL (calc) (ref <130)
Total CHOL/HDL Ratio: 3.2 (calc) (ref <5.0)
Triglycerides: 64 mg/dL (ref <150)

## 2019-12-09 LAB — TSH: TSH: 1.22 mIU/L (ref 0.40–4.50)

## 2019-12-15 ENCOUNTER — Other Ambulatory Visit: Payer: Self-pay | Admitting: *Deleted

## 2019-12-15 DIAGNOSIS — R7989 Other specified abnormal findings of blood chemistry: Secondary | ICD-10-CM

## 2020-03-12 ENCOUNTER — Other Ambulatory Visit: Payer: Self-pay | Admitting: Family Medicine

## 2020-03-12 DIAGNOSIS — G2581 Restless legs syndrome: Secondary | ICD-10-CM

## 2020-04-26 LAB — HM MAMMOGRAPHY

## 2020-04-26 LAB — RESULTS CONSOLE HPV: CHL HPV: NEGATIVE

## 2020-04-26 LAB — HM PAP SMEAR: HM Pap smear: NEGATIVE

## 2020-08-03 ENCOUNTER — Encounter: Payer: Self-pay | Admitting: Family Medicine

## 2020-08-09 ENCOUNTER — Observation Stay (HOSPITAL_COMMUNITY)
Admission: EM | Admit: 2020-08-09 | Discharge: 2020-08-11 | Disposition: A | Discharge status: Home / Self Care | Payer: 59 | Attending: Surgery | Admitting: Surgery

## 2020-08-09 ENCOUNTER — Emergency Department (HOSPITAL_COMMUNITY): Payer: 59

## 2020-08-09 ENCOUNTER — Encounter (HOSPITAL_COMMUNITY): Payer: Self-pay

## 2020-08-09 ENCOUNTER — Other Ambulatory Visit: Payer: Self-pay

## 2020-08-09 ENCOUNTER — Telehealth: Payer: Self-pay

## 2020-08-09 DIAGNOSIS — F411 Generalized anxiety disorder: Secondary | ICD-10-CM | POA: Diagnosis present

## 2020-08-09 DIAGNOSIS — G2581 Restless legs syndrome: Secondary | ICD-10-CM | POA: Diagnosis present

## 2020-08-09 DIAGNOSIS — Z7982 Long term (current) use of aspirin: Secondary | ICD-10-CM | POA: Diagnosis not present

## 2020-08-09 DIAGNOSIS — Z20822 Contact with and (suspected) exposure to covid-19: Secondary | ICD-10-CM | POA: Insufficient documentation

## 2020-08-09 DIAGNOSIS — K810 Acute cholecystitis: Secondary | ICD-10-CM

## 2020-08-09 DIAGNOSIS — G4700 Insomnia, unspecified: Secondary | ICD-10-CM | POA: Diagnosis present

## 2020-08-09 DIAGNOSIS — K812 Acute cholecystitis with chronic cholecystitis: Principal | ICD-10-CM | POA: Insufficient documentation

## 2020-08-09 DIAGNOSIS — K819 Cholecystitis, unspecified: Secondary | ICD-10-CM | POA: Diagnosis present

## 2020-08-09 DIAGNOSIS — R1011 Right upper quadrant pain: Secondary | ICD-10-CM | POA: Diagnosis present

## 2020-08-09 LAB — URINALYSIS, ROUTINE W REFLEX MICROSCOPIC
Bilirubin Urine: NEGATIVE
Glucose, UA: NEGATIVE mg/dL
Hgb urine dipstick: NEGATIVE
Ketones, ur: 20 mg/dL — AB
Leukocytes,Ua: NEGATIVE
Nitrite: NEGATIVE
Protein, ur: NEGATIVE mg/dL
Specific Gravity, Urine: 1.016 (ref 1.005–1.030)
pH: 5 (ref 5.0–8.0)

## 2020-08-09 LAB — CBC
HCT: 44.3 % (ref 36.0–46.0)
Hemoglobin: 14.5 g/dL (ref 12.0–15.0)
MCH: 30.1 pg (ref 26.0–34.0)
MCHC: 32.7 g/dL (ref 30.0–36.0)
MCV: 92.1 fL (ref 80.0–100.0)
Platelets: 232 10*3/uL (ref 150–400)
RBC: 4.81 MIL/uL (ref 3.87–5.11)
RDW: 11.9 % (ref 11.5–15.5)
WBC: 10.7 10*3/uL — ABNORMAL HIGH (ref 4.0–10.5)
nRBC: 0 % (ref 0.0–0.2)

## 2020-08-09 LAB — HEPATIC FUNCTION PANEL
ALT: 22 U/L (ref 0–44)
AST: 20 U/L (ref 15–41)
Albumin: 4.6 g/dL (ref 3.5–5.0)
Alkaline Phosphatase: 52 U/L (ref 38–126)
Bilirubin, Direct: 0.2 mg/dL (ref 0.0–0.2)
Indirect Bilirubin: 0.9 mg/dL (ref 0.3–0.9)
Total Bilirubin: 1.1 mg/dL (ref 0.3–1.2)
Total Protein: 7.3 g/dL (ref 6.5–8.1)

## 2020-08-09 LAB — TROPONIN I (HIGH SENSITIVITY)
Troponin I (High Sensitivity): 4 ng/L (ref <18)
Troponin I (High Sensitivity): 6 ng/L (ref <18)

## 2020-08-09 LAB — BASIC METABOLIC PANEL
Anion gap: 13 (ref 5–15)
BUN: 16 mg/dL (ref 6–20)
CO2: 22 mmol/L (ref 22–32)
Calcium: 9.7 mg/dL (ref 8.9–10.3)
Chloride: 107 mmol/L (ref 98–111)
Creatinine, Ser: 0.91 mg/dL (ref 0.44–1.00)
GFR, Estimated: >60 mL/min (ref >60)
Glucose, Bld: 122 mg/dL — ABNORMAL HIGH (ref 70–99)
Potassium: 4.1 mmol/L (ref 3.5–5.1)
Sodium: 142 mmol/L (ref 135–145)

## 2020-08-09 LAB — I-STAT BETA HCG BLOOD, ED (NOT ORDERABLE): I-stat hCG, quantitative: <5 m[IU]/mL (ref <5)

## 2020-08-09 LAB — LIPASE, BLOOD: Lipase: 37 U/L (ref 11–51)

## 2020-08-09 MED ORDER — IOHEXOL 300 MG/ML  SOLN
100.0000 mL | Freq: Once | INTRAMUSCULAR | Status: AC | PRN
  Administered 2020-08-09: 100 mL via INTRAVENOUS

## 2020-08-09 MED ORDER — FENTANYL CITRATE (PF) 100 MCG/2ML IJ SOLN
25.0000 ug | Freq: Once | INTRAMUSCULAR | Status: AC
  Administered 2020-08-09: 25 ug via INTRAVENOUS
  Filled 2020-08-09: qty 2

## 2020-08-09 MED ORDER — METOCLOPRAMIDE HCL 5 MG/ML IJ SOLN
5.0000 mg | Freq: Once | INTRAMUSCULAR | Status: AC
  Administered 2020-08-09: 5 mg via INTRAVENOUS
  Filled 2020-08-09: qty 2

## 2020-08-09 MED ORDER — SODIUM CHLORIDE 0.9 % IV BOLUS
1000.0000 mL | Freq: Once | INTRAVENOUS | Status: AC
  Administered 2020-08-09: 1000 mL via INTRAVENOUS

## 2020-08-09 NOTE — Telephone Encounter (Signed)
Elizabeth [name] called and states she has a fever, upper abdominal pain with vomiting. She is at work in [employer]. I advised her to go to the ED closest to her.   No openings today.

## 2020-08-09 NOTE — ED Provider Notes (Signed)
[facility] DEPT Provider Note   CSN: [id number] Arrival date & time: 08/09/20  1844     History Chief Complaint  Patient presents with  . Chest Pain    Elizabeth [name] is a 60 y.o. female.  HPI 60 year old female with a history of depression, fibroids, anxiety presents to the ER with complaints of 2 days of gastric pain, several episodes of nonbloody nonbilious vomiting, with radiation into her left upper quadrant and central chest.  She states that this started at about 2 AM today, reports sensation of reflux but has no history of this.  She does still have her gallbladder.  She denies any known fevers or chills, no shortness of breath.  No focal lower abdominal pain.  Denies any vaginal bleeding or discharge.    Past Medical History:  Diagnosis Date  . Allergy   . Anxiety   . Depression   . Fibroid, uterine   . Neuromuscular disorder ([place])    RLS    Patient Active Problem List   Diagnosis Date Noted  . Cholecystitis 08/09/2020  . RLS (restless legs syndrome) 05/15/2011  . UNSPECIFIED VITAMIN D DEFICIENCY 04/04/2010  . PARESTHESIA 12/13/2009  . DEPRESSION 08/17/2008  . INSOMNIA 08/17/2008  . ALLERGIC RHINITIS 09/16/2007  . IMPAIRED FASTING GLUCOSE 09/16/2007  . ANXIETY STATE, UNSPECIFIED 07/15/2007    Past Surgical History:  Procedure Laterality Date  . DILATION AND CURETTAGE OF UTERUS     x 2 for bleeding  . INGUINAL HERNIA REPAIR     bilat at 87month.   Marland Kitchen LASER ABLATION OF THE CERVIX    . TEAR DUCT PROBING     age 33   . TONSILLECTOMY AND ADENOIDECTOMY     age 15      OB History   No obstetric history on file.     Family History  Problem Relation Age of Onset  . Hypertension Father   . Heart disease Father   . Arthritis Father   . Diabetes Father   . Heart disease Mother        Septal defect   . Cancer Brother        leiiomyoscarcoma  . Hypertension Brother   . Cancer Brother        giant cell tumor.   . Kidney  Stones Brother   . Colon cancer Neg Hx     Social History   Tobacco Use  . Smoking status: Never Smoker  . Smokeless tobacco: Never Used  Vaping Use  . Vaping Use: Never used  Substance Use Topics  . Alcohol use: Yes    Comment: Rarely.   . Drug use: No    Home Medications Prior to Admission medications   Medication Sig Start Date End Date Taking? Authorizing Provider  Coenzyme Q10 (CO Q 10) 100 MG CAPS Take 100 mg by mouth daily.   Yes [provider]  ferrous sulfate 325 (65 FE) MG tablet Take 325 mg by mouth See admin instructions. Tuesday, Thursday, Saturday and sunday   Yes [provider]  gabapentin (NEURONTIN) 800 MG tablet TAKE ONE TABLET BY MOUTH AT BEDTIME Patient taking differently: Take 800 mg by mouth at bedtime. 03/13/20  Yes Hali Marry, MD  Glucosamine 750 MG TABS Take 1 tablet by mouth 2 (two) times daily.   Yes [provider]  loratadine (CLARITIN) 10 MG tablet Take 10 mg by mouth daily.   Yes [provider]  Multiple Vitamin (MULTIVITAMIN) tablet Take  1 tablet by mouth daily.   Yes [provider]  vitamin E 180 MG (400 UNITS) capsule Take 400 Units by mouth daily.   Yes [provider]  Ascorbic Acid (VITAMIN C) 1000 MG tablet Take 1 tablet by mouth daily.    [provider]  aspirin 81 MG tablet Take 81 mg by mouth daily.    [provider]  busPIRone (BUSPAR) 10 MG tablet TAKE TWO TABLETS BY MOUTH TWICE A DAY Patient taking differently: Take 10 mg by mouth 2 (two) times daily. 03/13/20   Hali Marry, MD  CAFFEINE PO Take 50 mg by mouth daily.    [provider]  Cholecalciferol (VITAMIN D-3 PO) Take 2,000 Units by mouth daily.    [provider]  norethindrone-ethinyl estradiol (FEMHRT 1/5) 1-5 MG-MCG TABS tablet Take 1 tablet by mouth daily.    [provider]    Allergies    Dextromethorphan-guaifenesin, Fluoxetine hcl, and Wellbutrin  [bupropion]  Review of Systems   Review of Systems  Constitutional: Negative for chills and fever.  HENT: Negative for ear pain and sore throat.   Eyes: Negative for pain and visual disturbance.  Respiratory: Negative for cough and shortness of breath.   Cardiovascular: Positive for chest pain. Negative for palpitations.  Gastrointestinal: Positive for abdominal pain, nausea and vomiting.  Genitourinary: Negative for dysuria and hematuria.  Musculoskeletal: Negative for arthralgias and back pain.  Skin: Negative for color change and rash.  Neurological: Negative for seizures and syncope.  All other systems reviewed and are negative.   Physical Exam Updated Vital Signs BP (!) 147/81 (BP Location: Right Arm)   Pulse 63   Temp 98.2 F (36.8 C)   Resp 18   Ht 5\' 4"  (1.626 m)   Wt 77.1 kg   SpO2 98%   BMI 29.18 kg/m   Physical Exam Vitals and nursing note reviewed.  Constitutional:      General: She is not in acute distress.    Appearance: She is well-developed. She is not ill-appearing or diaphoretic.  HENT:     Head: Normocephalic and atraumatic.  Eyes:     Conjunctiva/sclera: Conjunctivae normal.  Cardiovascular:     Rate and Rhythm: Normal rate and regular rhythm.     Heart sounds: Normal heart sounds. No murmur heard.   Pulmonary:     Effort: Pulmonary effort is normal. No respiratory distress.     Breath sounds: Normal breath sounds. No decreased breath sounds.  Abdominal:     Palpations: Abdomen is soft. There is no hepatomegaly or mass.     Tenderness: There is no abdominal tenderness. There is no guarding.     Comments: Mild to moderate epigastric pain, negative Murphy sign, mild right upper quadrant pain as well as left upper quadrant pain.  No flank tenderness, no lower abdominal tenderness on exam.  No peritoneal signs, no guarding, no rebound.  Negative Murphy's.  Musculoskeletal:     Cervical back: Neck supple.     Right lower leg: No tenderness. No  edema.     Left lower leg: No tenderness. No edema.  Skin:    General: Skin is warm and dry.     Capillary Refill: Capillary refill takes less than 2 seconds.  Neurological:     General: No focal deficit present.     Mental Status: She is alert.  Psychiatric:        Mood and Affect: Mood normal.     ED Results /  Procedures / Treatments   Labs (all labs ordered are listed, but only abnormal results are displayed) Labs Reviewed  BASIC METABOLIC PANEL - Abnormal; Notable for the following components:      Result Value   Glucose, Bld 122 (*)    All other components within normal limits  CBC - Abnormal; Notable for the following components:   WBC 10.7 (*)    All other components within normal limits  URINALYSIS, ROUTINE W REFLEX MICROSCOPIC - Abnormal; Notable for the following components:   Color, Urine STRAW (*)    Ketones, ur 20 (*)    All other components within normal limits  RESP PANEL BY RT-PCR (FLU A&B, COVID) ARPGX2  HEPATIC FUNCTION PANEL  LIPASE, BLOOD  I-STAT BETA HCG BLOOD, ED ([facility], [facility], [facility] ONLY)  I-STAT BETA HCG BLOOD, ED (NOT ORDERABLE)  TROPONIN I (HIGH SENSITIVITY)  TROPONIN I (HIGH SENSITIVITY)    EKG None  Radiology DG Chest 2 View  Result Date: 08/09/2020 CLINICAL DATA:  Chest pain EXAM: CHEST - 2 VIEW COMPARISON:  05/02/2006 report FINDINGS: The heart size and mediastinal contours are within normal limits. Both lungs are clear. The visualized skeletal structures are unremarkable. IMPRESSION: No active cardiopulmonary disease. Electronically Signed   By: Donavan Foil M.D.   On: 08/09/2020 19:40   CT ABDOMEN PELVIS W CONTRAST  Result Date: 08/09/2020 CLINICAL DATA:  Chest pain and mid upper abdominal pain, pain radiating to lower left abdomen EXAM: CT ABDOMEN AND PELVIS WITH CONTRAST TECHNIQUE: Multidetector CT imaging of the abdomen and pelvis was performed using the standard protocol following bolus administration of intravenous contrast. CONTRAST:  169mL  OMNIPAQUE IOHEXOL 300 MG/ML  SOLN COMPARISON:  None. FINDINGS: Lower chest: No acute pleural or parenchymal lung disease. Hepatobiliary: Gallbladder is distended, with diffuse gallbladder wall thickening and trace pericholecystic fluid. No calcified gallstones. The appearance is suspicious for acute cholecystitis. Liver enhances normally.  No biliary dilation. Pancreas: Unremarkable. No pancreatic ductal dilatation or surrounding inflammatory changes. Spleen: Normal in size without focal abnormality. Adrenals/Urinary Tract: The kidneys enhance normally and symmetrically. No urinary tract calculi or obstructive uropathy. The adrenals and bladder are unremarkable. Stomach/Bowel: No bowel obstruction or ileus. There is a normal retrocecal appendix. No bowel wall thickening or inflammatory change. Vascular/Lymphatic: No significant vascular findings are present. No enlarged abdominal or pelvic lymph nodes. Reproductive: Uterus and bilateral adnexa are unremarkable. Other: Trace pericholecystic fluid as above. No free intraperitoneal gas. No abdominal wall hernia. Musculoskeletal: No acute or destructive bony lesions. Reconstructed images demonstrate no additional findings. IMPRESSION: 1. Distended gallbladder, with gallbladder wall thickening and trace pericholecystic fluid suspicious for acute cholecystitis. No calcified gallstones. If further evaluation is desired, right upper quadrant ultrasound or nuclear medicine hepatobiliary scan could be considered. 2. Otherwise unremarkable CT of the abdomen and pelvis. Electronically Signed   By: Randa Ngo M.D.   On: 08/09/2020 22:00   US Abdomen Limited RUQ (LIVER/GB)  Result Date: 08/09/2020 CLINICAL DATA:  Right upper quadrant pain. EXAM: ULTRASOUND ABDOMEN LIMITED RIGHT UPPER QUADRANT COMPARISON:  Abdominopelvic CT earlier today. FINDINGS: Gallbladder: Physiologically distended. Multiple intraluminal gallstones. There is diffuse gallbladder wall thickening up to 8  mm. Small amount of pericholecystic fluid. No sonographic Murphy sign noted by sonographer. Common bile duct: Diameter: 5 mm.  No evidence of choledocholithiasis. Liver: Tiny cyst in the left lobe on prior CT not well seen by ultrasound. Within normal limits in parenchymal echogenicity. Portal vein is patent on color Doppler imaging with normal direction of  blood flow towards the liver. Other: None. IMPRESSION: 1. Sonographic findings suspicious for acute cholecystitis. Gallstones with diffuse gallbladder wall thickening and trace pericholecystic fluid. 2. No biliary dilatation Electronically Signed   By: Keith Rake M.D.   On: 08/09/2020 23:45    Procedures Procedures   Medications Ordered in ED Medications  sodium chloride 0.9 % bolus 1,000 mL (0 mLs Intravenous Stopped 08/09/20 2308)  metoCLOPramide (REGLAN) injection 5 mg (5 mg Intravenous Given 08/09/20 2130)  iohexol (OMNIPAQUE) 300 MG/ML solution 100 mL (100 mLs Intravenous Contrast Given 08/09/20 2142)  fentaNYL (SUBLIMAZE) injection 25 mcg (25 mcg Intravenous Given 08/09/20 2330)    ED Course  I have reviewed the triage vital signs and the nursing notes.  Pertinent labs & imaging results that were available during my care of the patient were reviewed by me and considered in my medical decision making (see chart for details).    MDM Rules/Calculators/A&P                         60 year old female who presents to the ER with epigastric pain radiating up into her chest with several episodes of nonbloody nonbilious vomiting.  On arrival, she is overall well-appearing, resting comfortably in the ER bed.  Her blood pressure was slightly elevated at 168/88 on arrival, afebrile, not tachycardic, tachypneic hypoxic.  Blood pressure improved throughout the ED course.  Physical exam with epigastric tenderness, negative Murphy's.  No flank tenderness on exam.  DDx includes acute cholecystitis, GERD, PUD, pancreatitis, ACS, less likely  dissection, pneumonia  Labs and imaging ordered, reviewed and interpreted by me.  CBC with mild leukocytosis of 10.7, BMP without any significant abnormalities, normal hepatic function panel.  UA with evidence of ketones, but no evidence of UTI or blood.  Delta troponins are negative.  Lipase is negative.  Negative pregnancy.  Chest x-ray without acute abnormalities.  CT of the abdomen with findings concerning for acute cholecystitis.  Consulted Dr. Marcello Moores with general surgery who recommends right upper quadrant ultrasound for confirmation.  I personally reviewed right upper quadrant ultrasound, which does have findings consistent with acute cholecystitis.  Patient initially refused analgesia, she states that she is opioid nave and does not do anything sedating.  I did offer her 25 mg of fentanyl which she is agreeable to.  Received 1 L fluid bolus, Reglan.  Notes improvement in symptoms.  Reconsulted Dr. Marcello Moores, who will admit the patient for plan for surgery in the morning.  Patient was informed of this, she is agreeable.  Stable for admission at this time.  Pain well controlled.  Final Clinical Impression(s) / ED Diagnoses Final diagnoses:  RUQ abdominal pain  Acute cholecystitis    Rx / DC Orders ED Discharge Orders    None       Garald Balding, PA-C 08/10/20 0003    Tegeler, Gwenyth Allegra, MD 08/10/20 0009

## 2020-08-09 NOTE — H&P (Signed)
CC: RUQ pain   HPI: Elizabeth [name] is an 60 y.o. female who is here for chest pain, epigastric pain, and RUQ abdominal pain radiating towards back since early Wed am. Reports 1 episode of vomiting around 0600 and a subjective fever.   Past Medical History:  Diagnosis Date  . Allergy   . Anxiety   . Depression   . Fibroid, uterine   . Neuromuscular disorder ([place])    RLS    Past Surgical History:  Procedure Laterality Date  . DILATION AND CURETTAGE OF UTERUS     x 2 for bleeding  . INGUINAL HERNIA REPAIR     bilat at 5month.   Marland Kitchen LASER ABLATION OF THE CERVIX    . TEAR DUCT PROBING     age 32   . TONSILLECTOMY AND ADENOIDECTOMY     age 82     Family History  Problem Relation Age of Onset  . Hypertension Father   . Heart disease Father   . Arthritis Father   . Diabetes Father   . Heart disease Mother        Septal defect   . Cancer Brother        leiiomyoscarcoma  . Hypertension Brother   . Cancer Brother        giant cell tumor.   . Kidney Stones Brother   . Colon cancer Neg Hx     Social:  reports that she has never smoked. She has never used smokeless tobacco. She reports current alcohol use. She reports that she does not use drugs.  Allergies:  Allergies  Allergen Reactions  . Dextromethorphan-Guaifenesin     REACTION: hives  . Fluoxetine Hcl     REACTION: skin reaction  . Wellbutrin [Bupropion] Rash    From head to toe    Medications: I have reviewed the patient's current medications.  Results for orders placed or performed during the hospital encounter of 08/09/20 (from the past 48 hour(s))  Basic metabolic panel     Status: Abnormal   Collection Time: 08/09/20  7:11 PM  Result Value Ref Range   Sodium 142 135 - 145 mmol/L   Potassium 4.1 3.5 - 5.1 mmol/L   Chloride 107 98 - 111 mmol/L   CO2 22 22 - 32 mmol/L   Glucose, Bld 122 (H) 70 - 99 mg/dL    Comment: Glucose reference range applies only to samples taken after fasting for at least 8  hours.   BUN 16 6 - 20 mg/dL   Creatinine, Ser 0.91 0.44 - 1.00 mg/dL   Calcium 9.7 8.9 - 10.3 mg/dL   GFR, Estimated >60 >60 mL/min    Comment: (NOTE) Calculated using the CKD-EPI Creatinine Equation (2021)    Anion gap 13 5 - 15    Comment: Performed at [facility], [place] [address] [place],  [address]  CBC     Status: Abnormal   Collection Time: 08/09/20  7:11 PM  Result Value Ref Range   WBC 10.7 (H) 4.0 - 10.5 K/uL   RBC 4.81 3.87 - 5.11 MIL/uL   Hemoglobin 14.5 12.0 - 15.0 g/dL   HCT 44.3 36.0 - 46.0 %   MCV 92.1 80.0 - 100.0 fL   MCH 30.1 26.0 - 34.0 pg   MCHC 32.7 30.0 - 36.0 g/dL   RDW 11.9 11.5 - 15.5 %   Platelets 232 150 - 400 K/uL   nRBC 0.0 0.0 - 0.2 %    Comment:  Performed at [facility], [place] [address], [place], [place] [address]  Troponin I (High Sensitivity)     Status: None   Collection Time: 08/09/20  7:11 PM  Result Value Ref Range   Troponin I (High Sensitivity) 4 <18 ng/L    Comment: (NOTE) Elevated high sensitivity troponin I (hsTnI) values and significant  changes across serial measurements may suggest ACS but many other  chronic and acute conditions are known to elevate hsTnI results.  Refer to the "Links" section for chest pain algorithms and additional  guidance. Performed at [facility], [place] [address], [place], [place] [address]   I-Stat beta hCG blood, ED     Status: None   Collection Time: 08/09/20  7:19 PM  Result Value Ref Range   I-stat hCG, quantitative <5.0 <5 mIU/mL   Comment 3            Comment:   GEST. AGE      CONC.  (mIU/mL)   <=1 WEEK        5 - 50     2 WEEKS       50 - 500     3 WEEKS       100 - 10,000     4 WEEKS     1,000 - 30,000        FEMALE AND NON-PREGNANT FEMALE:     LESS THAN 5 mIU/mL   Troponin I (High Sensitivity)     Status: None   Collection Time: 08/09/20  9:36 PM  Result Value Ref Range   Troponin I (High Sensitivity) 6 <18 ng/L     Comment: (NOTE) Elevated high sensitivity troponin I (hsTnI) values and significant  changes across serial measurements may suggest ACS but many other  chronic and acute conditions are known to elevate hsTnI results.  Refer to the "Links" section for chest pain algorithms and additional  guidance. Performed at [facility], [place] [address] [place], [place] [address]   Hepatic function panel     Status: None   Collection Time: 08/09/20  9:36 PM  Result Value Ref Range   Total Protein 7.3 6.5 - 8.1 g/dL   Albumin 4.6 3.5 - 5.0 g/dL   AST 20 15 - 41 U/L   ALT 22 0 - 44 U/L   Alkaline Phosphatase 52 38 - 126 U/L   Total Bilirubin 1.1 0.3 - 1.2 mg/dL   Bilirubin, Direct 0.2 0.0 - 0.2 mg/dL   Indirect Bilirubin 0.9 0.3 - 0.9 mg/dL    Comment: Performed at [facility], [place] [address] [place], [place] [address]  Lipase, blood     Status: None   Collection Time: 08/09/20  9:36 PM  Result Value Ref Range   Lipase 37 11 - 51 U/L    Comment: Performed at [facility], [place] [address] [place], [place] [address]  Urinalysis, Routine w reflex microscopic Urine, Clean Catch     Status: Abnormal   Collection Time: 08/09/20 10:01 PM  Result Value Ref Range   Color, Urine STRAW (A) YELLOW   APPearance CLEAR CLEAR   Specific Gravity, Urine 1.016 1.005 - 1.030   pH 5.0 5.0 - 8.0   Glucose, UA NEGATIVE NEGATIVE mg/dL   Hgb urine dipstick NEGATIVE NEGATIVE   Bilirubin Urine NEGATIVE NEGATIVE   Ketones, ur 20 (A) NEGATIVE mg/dL   Protein, ur NEGATIVE NEGATIVE mg/dL   Nitrite NEGATIVE NEGATIVE   Leukocytes,Ua  NEGATIVE NEGATIVE    Comment: Performed at [place] [address], [place], [place] [address]    DG Chest 2 View  Result Date: 08/09/2020 CLINICAL DATA:  Chest pain EXAM: CHEST - 2 VIEW COMPARISON:  05/02/2006 report FINDINGS: The heart size and mediastinal contours are within normal limits. Both lungs are  clear. The visualized skeletal structures are unremarkable. IMPRESSION: No active cardiopulmonary disease. Electronically Signed   By: Donavan Foil M.D.   On: 08/09/2020 19:40   CT ABDOMEN PELVIS W CONTRAST  Result Date: 08/09/2020 CLINICAL DATA:  Chest pain and mid upper abdominal pain, pain radiating to lower left abdomen EXAM: CT ABDOMEN AND PELVIS WITH CONTRAST TECHNIQUE: Multidetector CT imaging of the abdomen and pelvis was performed using the standard protocol following bolus administration of intravenous contrast. CONTRAST:  163mL OMNIPAQUE IOHEXOL 300 MG/ML  SOLN COMPARISON:  None. FINDINGS: Lower chest: No acute pleural or parenchymal lung disease. Hepatobiliary: Gallbladder is distended, with diffuse gallbladder wall thickening and trace pericholecystic fluid. No calcified gallstones. The appearance is suspicious for acute cholecystitis. Liver enhances normally.  No biliary dilation. Pancreas: Unremarkable. No pancreatic ductal dilatation or surrounding inflammatory changes. Spleen: Normal in size without focal abnormality. Adrenals/Urinary Tract: The kidneys enhance normally and symmetrically. No urinary tract calculi or obstructive uropathy. The adrenals and bladder are unremarkable. Stomach/Bowel: No bowel obstruction or ileus. There is a normal retrocecal appendix. No bowel wall thickening or inflammatory change. Vascular/Lymphatic: No significant vascular findings are present. No enlarged abdominal or pelvic lymph nodes. Reproductive: Uterus and bilateral adnexa are unremarkable. Other: Trace pericholecystic fluid as above. No free intraperitoneal gas. No abdominal wall hernia. Musculoskeletal: No acute or destructive bony lesions. Reconstructed images demonstrate no additional findings. IMPRESSION: 1. Distended gallbladder, with gallbladder wall thickening and trace pericholecystic fluid suspicious for acute cholecystitis. No calcified gallstones. If further evaluation is desired, right upper  quadrant ultrasound or nuclear medicine hepatobiliary scan could be considered. 2. Otherwise unremarkable CT of the abdomen and pelvis. Electronically Signed   By: Randa Ngo M.D.   On: 08/09/2020 22:00   US Abdomen Limited RUQ (LIVER/GB)  Result Date: 08/09/2020 CLINICAL DATA:  Right upper quadrant pain. EXAM: ULTRASOUND ABDOMEN LIMITED RIGHT UPPER QUADRANT COMPARISON:  Abdominopelvic CT earlier today. FINDINGS: Gallbladder: Physiologically distended. Multiple intraluminal gallstones. There is diffuse gallbladder wall thickening up to 8 mm. Small amount of pericholecystic fluid. No sonographic Murphy sign noted by sonographer. Common bile duct: Diameter: 5 mm.  No evidence of choledocholithiasis. Liver: Tiny cyst in the left lobe on prior CT not well seen by ultrasound. Within normal limits in parenchymal echogenicity. Portal vein is patent on color Doppler imaging with normal direction of blood flow towards the liver. Other: None. IMPRESSION: 1. Sonographic findings suspicious for acute cholecystitis. Gallstones with diffuse gallbladder wall thickening and trace pericholecystic fluid. 2. No biliary dilatation Electronically Signed   By: Keith Rake M.D.   On: 08/09/2020 23:45    ROS - all of the below systems have been reviewed with the patient and positives are indicated with bold text General: chills, fever or night sweats Eyes: blurry vision or double vision ENT: epistaxis or sore throat Allergy/Immunology: itchy/watery eyes or nasal congestion Hematologic/Lymphatic: bleeding problems, blood clots or swollen lymph nodes Endocrine: temperature intolerance or unexpected weight changes Breast: new or changing breast lumps or nipple discharge Resp: cough, shortness of breath, or wheezing CV: chest pain or dyspnea on exertion GI: as per HPI GU: dysuria, trouble voiding, or hematuria MSK:  joint pain or joint stiffness Neuro: TIA or stroke symptoms Derm: pruritus and skin lesion  changes Psych: anxiety and depression  PE Blood pressure (!) 147/81, pulse 63, temperature 98.2 F (36.8 C), resp. rate 18, height 5\' 4"  (1.626 m), weight 77.1 kg, SpO2 98 %. Constitutional: NAD; conversant; no deformities Eyes: Moist conjunctiva; no lid lag; anicteric; PERRL Neck: Trachea midline; no thyromegaly Lungs: Normal respiratory effort; no tactile fremitus CV: RRR; no palpable thrills; no pitting edema GI: Abd TTP RUQ; no palpable hepatosplenomegaly MSK: Normal range of motion of extremities; no clubbing/cyanosis Psychiatric: Appropriate affect; alert and oriented x3 Lymphatic: No palpable cervical or axillary lymphadenopathy  Results for orders placed or performed during the hospital encounter of 08/09/20 (from the past 48 hour(s))  Basic metabolic panel     Status: Abnormal   Collection Time: 08/09/20  7:11 PM  Result Value Ref Range   Sodium 142 135 - 145 mmol/L   Potassium 4.1 3.5 - 5.1 mmol/L   Chloride 107 98 - 111 mmol/L   CO2 22 22 - 32 mmol/L   Glucose, Bld 122 (H) 70 - 99 mg/dL    Comment: Glucose reference range applies only to samples taken after fasting for at least 8 hours.   BUN 16 6 - 20 mg/dL   Creatinine, Ser 0.91 0.44 - 1.00 mg/dL   Calcium 9.7 8.9 - 10.3 mg/dL   GFR, Estimated >60 >60 mL/min    Comment: (NOTE) Calculated using the CKD-EPI Creatinine Equation (2021)    Anion gap 13 5 - 15    Comment: Performed at [facility], [place] [address], [place], [place] [address]  CBC     Status: Abnormal   Collection Time: 08/09/20  7:11 PM  Result Value Ref Range   WBC 10.7 (H) 4.0 - 10.5 K/uL   RBC 4.81 3.87 - 5.11 MIL/uL   Hemoglobin 14.5 12.0 - 15.0 g/dL   HCT 44.3 36.0 - 46.0 %   MCV 92.1 80.0 - 100.0 fL   MCH 30.1 26.0 - 34.0 pg   MCHC 32.7 30.0 - 36.0 g/dL   RDW 11.9 11.5 - 15.5 %   Platelets 232 150 - 400 K/uL   nRBC 0.0 0.0 - 0.2 %    Comment: Performed at [facility], [place] [address], [place],  [place] [address]  Troponin I (High Sensitivity)     Status: None   Collection Time: 08/09/20  7:11 PM  Result Value Ref Range   Troponin I (High Sensitivity) 4 <18 ng/L    Comment: (NOTE) Elevated high sensitivity troponin I (hsTnI) values and significant  changes across serial measurements may suggest ACS but many other  chronic and acute conditions are known to elevate hsTnI results.  Refer to the "Links" section for chest pain algorithms and additional  guidance. Performed at [facility], [place] [address] [place], [place] [address]   I-Stat beta hCG blood, ED     Status: None   Collection Time: 08/09/20  7:19 PM  Result Value Ref Range   I-stat hCG, quantitative <5.0 <5 mIU/mL   Comment 3            Comment:   GEST. AGE      CONC.  (mIU/mL)   <=1 WEEK        5 - 50     2 WEEKS       50 - 500     3 WEEKS  100 - 10,000     4 WEEKS     1,000 - 30,000        FEMALE AND NON-PREGNANT FEMALE:     LESS THAN 5 mIU/mL   Troponin I (High Sensitivity)     Status: None   Collection Time: 08/09/20  9:36 PM  Result Value Ref Range   Troponin I (High Sensitivity) 6 <18 ng/L    Comment: (NOTE) Elevated high sensitivity troponin I (hsTnI) values and significant  changes across serial measurements may suggest ACS but many other  chronic and acute conditions are known to elevate hsTnI results.  Refer to the "Links" section for chest pain algorithms and additional  guidance. Performed at [facility], [place] [address] [place], [place] [address]   Hepatic function panel     Status: None   Collection Time: 08/09/20  9:36 PM  Result Value Ref Range   Total Protein 7.3 6.5 - 8.1 g/dL   Albumin 4.6 3.5 - 5.0 g/dL   AST 20 15 - 41 U/L   ALT 22 0 - 44 U/L   Alkaline Phosphatase 52 38 - 126 U/L   Total Bilirubin 1.1 0.3 - 1.2 mg/dL   Bilirubin, Direct 0.2 0.0 - 0.2 mg/dL   Indirect Bilirubin 0.9 0.3 - 0.9 mg/dL    Comment: Performed at [facility], [place] [address], [place], [place] [address]  Lipase, blood     Status: None   Collection Time: 08/09/20  9:36 PM  Result Value Ref Range   Lipase 37 11 - 51 U/L    Comment: Performed at [facility], [place] [address] [place], [place] [address]  Urinalysis, Routine w reflex microscopic Urine, Clean Catch     Status: Abnormal   Collection Time: 08/09/20 10:01 PM  Result Value Ref Range   Color, Urine STRAW (A) YELLOW   APPearance CLEAR CLEAR   Specific Gravity, Urine 1.016 1.005 - 1.030   pH 5.0 5.0 - 8.0   Glucose, UA NEGATIVE NEGATIVE mg/dL   Hgb urine dipstick NEGATIVE NEGATIVE   Bilirubin Urine NEGATIVE NEGATIVE   Ketones, ur 20 (A) NEGATIVE mg/dL   Protein, ur NEGATIVE NEGATIVE mg/dL   Nitrite NEGATIVE NEGATIVE   Leukocytes,Ua NEGATIVE NEGATIVE    Comment: Performed at [place] [address] [place], [place] [address]    DG Chest 2 View  Result Date: 08/09/2020 CLINICAL DATA:  Chest pain EXAM: CHEST - 2 VIEW COMPARISON:  05/02/2006 report FINDINGS: The heart size and mediastinal contours are within normal limits. Both lungs are clear. The visualized skeletal structures are unremarkable. IMPRESSION: No active cardiopulmonary disease. Electronically Signed   By: Donavan Foil M.D.   On: 08/09/2020 19:40   CT ABDOMEN PELVIS W CONTRAST  Result Date: 08/09/2020 CLINICAL DATA:  Chest pain and mid upper abdominal pain, pain radiating to lower left abdomen EXAM: CT ABDOMEN AND PELVIS WITH CONTRAST TECHNIQUE: Multidetector CT imaging of the abdomen and pelvis was performed using the standard protocol following bolus administration of intravenous contrast. CONTRAST:  151mL OMNIPAQUE IOHEXOL 300 MG/ML  SOLN COMPARISON:  None. FINDINGS: Lower chest: No acute pleural or parenchymal lung disease. Hepatobiliary: Gallbladder is distended, with diffuse gallbladder wall thickening and trace pericholecystic fluid. No calcified gallstones. The  appearance is suspicious for acute cholecystitis. Liver enhances normally.  No biliary dilation. Pancreas: Unremarkable. No pancreatic ductal dilatation or surrounding inflammatory changes. Spleen: Normal in size without focal abnormality. Adrenals/Urinary Tract: The  kidneys enhance normally and symmetrically. No urinary tract calculi or obstructive uropathy. The adrenals and bladder are unremarkable. Stomach/Bowel: No bowel obstruction or ileus. There is a normal retrocecal appendix. No bowel wall thickening or inflammatory change. Vascular/Lymphatic: No significant vascular findings are present. No enlarged abdominal or pelvic lymph nodes. Reproductive: Uterus and bilateral adnexa are unremarkable. Other: Trace pericholecystic fluid as above. No free intraperitoneal gas. No abdominal wall hernia. Musculoskeletal: No acute or destructive bony lesions. Reconstructed images demonstrate no additional findings. IMPRESSION: 1. Distended gallbladder, with gallbladder wall thickening and trace pericholecystic fluid suspicious for acute cholecystitis. No calcified gallstones. If further evaluation is desired, right upper quadrant ultrasound or nuclear medicine hepatobiliary scan could be considered. 2. Otherwise unremarkable CT of the abdomen and pelvis. Electronically Signed   By: Randa Ngo M.D.   On: 08/09/2020 22:00   US Abdomen Limited RUQ (LIVER/GB)  Result Date: 08/09/2020 CLINICAL DATA:  Right upper quadrant pain. EXAM: ULTRASOUND ABDOMEN LIMITED RIGHT UPPER QUADRANT COMPARISON:  Abdominopelvic CT earlier today. FINDINGS: Gallbladder: Physiologically distended. Multiple intraluminal gallstones. There is diffuse gallbladder wall thickening up to 8 mm. Small amount of pericholecystic fluid. No sonographic Murphy sign noted by sonographer. Common bile duct: Diameter: 5 mm.  No evidence of choledocholithiasis. Liver: Tiny cyst in the left lobe on prior CT not well seen by ultrasound. Within normal limits in  parenchymal echogenicity. Portal vein is patent on color Doppler imaging with normal direction of blood flow towards the liver. Other: None. IMPRESSION: 1. Sonographic findings suspicious for acute cholecystitis. Gallstones with diffuse gallbladder wall thickening and trace pericholecystic fluid. 2. No biliary dilatation Electronically Signed   By: Keith Rake M.D.   On: 08/09/2020 23:45     A/P: Elizabeth [name] is an 61 y.o. female with abd pain and signs of cholecystitis on RUQ [place].  Will admit to floor.  IV abx.  Plan for surgery (lap chole).  Rosario Adie, MD  [facility]

## 2020-08-09 NOTE — Telephone Encounter (Signed)
I agree, esp with fever

## 2020-08-09 NOTE — ED Triage Notes (Signed)
Pt sts left chest pain above breast, epigastric pain, and LUQ abdominal pain radiating towards back since 0200 today. Reports 1 episode of vomiting around 0600.

## 2020-08-10 ENCOUNTER — Observation Stay (HOSPITAL_COMMUNITY): Payer: 59 | Admitting: Certified Registered Nurse Anesthetist

## 2020-08-10 ENCOUNTER — Encounter (HOSPITAL_COMMUNITY)
Admission: EM | Disposition: A | Discharge status: Home / Self Care | Payer: Self-pay | Source: Home / Self Care | Attending: Emergency Medicine

## 2020-08-10 ENCOUNTER — Encounter (HOSPITAL_COMMUNITY): Payer: Self-pay

## 2020-08-10 DIAGNOSIS — Z20822 Contact with and (suspected) exposure to covid-19: Secondary | ICD-10-CM | POA: Diagnosis not present

## 2020-08-10 DIAGNOSIS — Z7982 Long term (current) use of aspirin: Secondary | ICD-10-CM | POA: Diagnosis not present

## 2020-08-10 DIAGNOSIS — K812 Acute cholecystitis with chronic cholecystitis: Secondary | ICD-10-CM | POA: Diagnosis not present

## 2020-08-10 HISTORY — PX: LAPAROSCOPIC CHOLECYSTECTOMY SINGLE SITE WITH INTRAOPERATIVE CHOLANGIOGRAM: SHX6538

## 2020-08-10 LAB — COMPREHENSIVE METABOLIC PANEL
ALT: 25 U/L (ref 0–44)
AST: 27 U/L (ref 15–41)
Albumin: 4 g/dL (ref 3.5–5.0)
Alkaline Phosphatase: 48 U/L (ref 38–126)
Anion gap: 11 (ref 5–15)
BUN: 12 mg/dL (ref 6–20)
CO2: 21 mmol/L — ABNORMAL LOW (ref 22–32)
Calcium: 8.8 mg/dL — ABNORMAL LOW (ref 8.9–10.3)
Chloride: 110 mmol/L (ref 98–111)
Creatinine, Ser: 0.84 mg/dL (ref 0.44–1.00)
GFR, Estimated: >60 mL/min (ref >60)
Glucose, Bld: 148 mg/dL — ABNORMAL HIGH (ref 70–99)
Potassium: 3.5 mmol/L (ref 3.5–5.1)
Sodium: 142 mmol/L (ref 135–145)
Total Bilirubin: 1.3 mg/dL — ABNORMAL HIGH (ref 0.3–1.2)
Total Protein: 6.6 g/dL (ref 6.5–8.1)

## 2020-08-10 LAB — RESP PANEL BY RT-PCR (FLU A&B, COVID) ARPGX2
Influenza A by PCR: NEGATIVE
Influenza B by PCR: NEGATIVE
SARS Coronavirus 2 by RT PCR: NEGATIVE

## 2020-08-10 LAB — CBC
HCT: 40.5 % (ref 36.0–46.0)
Hemoglobin: 13.4 g/dL (ref 12.0–15.0)
MCH: 30.5 pg (ref 26.0–34.0)
MCHC: 33.1 g/dL (ref 30.0–36.0)
MCV: 92.3 fL (ref 80.0–100.0)
Platelets: 195 10*3/uL (ref 150–400)
RBC: 4.39 MIL/uL (ref 3.87–5.11)
RDW: 11.9 % (ref 11.5–15.5)
WBC: 11.8 10*3/uL — ABNORMAL HIGH (ref 4.0–10.5)
nRBC: 0 % (ref 0.0–0.2)

## 2020-08-10 LAB — HIV ANTIBODY (ROUTINE TESTING W REFLEX): HIV Screen 4th Generation wRfx: NONREACTIVE

## 2020-08-10 SURGERY — LAPAROSCOPIC CHOLECYSTECTOMY SINGLE SITE WITH INTRAOPERATIVE CHOLANGIOGRAM
Anesthesia: General | Site: Abdomen

## 2020-08-10 MED ORDER — PROPOFOL 1000 MG/100ML IV EMUL
INTRAVENOUS | Status: AC
  Filled 2020-08-10: qty 100

## 2020-08-10 MED ORDER — DIPHENHYDRAMINE HCL 50 MG/ML IJ SOLN: 25.0000 mg | Freq: Four times a day (QID) | INTRAMUSCULAR | Status: DC | PRN

## 2020-08-10 MED ORDER — STERILE WATER FOR IRRIGATION IR SOLN
Status: DC | PRN
  Administered 2020-08-10: 1000 mL

## 2020-08-10 MED ORDER — PROCHLORPERAZINE EDISYLATE 10 MG/2ML IJ SOLN: 5.0000 mg | INTRAMUSCULAR | Status: DC | PRN

## 2020-08-10 MED ORDER — SODIUM CHLORIDE 0.9 % IV SOLN
INTRAVENOUS | Status: AC
  Filled 2020-08-10: qty 20

## 2020-08-10 MED ORDER — CALCIUM POLYCARBOPHIL 625 MG PO TABS
625.0000 mg | ORAL_TABLET | Freq: Two times a day (BID) | ORAL | Status: DC
  Administered 2020-08-10 – 2020-08-11 (×2): 625 mg via ORAL
  Filled 2020-08-10 (×2): qty 1

## 2020-08-10 MED ORDER — ALUM & MAG HYDROXIDE-SIMETH 200-200-20 MG/5ML PO SUSP: 30.0000 mL | Freq: Four times a day (QID) | ORAL | Status: DC | PRN

## 2020-08-10 MED ORDER — SODIUM CHLORIDE 0.9 % IV SOLN
2.0000 g | INTRAVENOUS | Status: DC
  Administered 2020-08-10 – 2020-08-11 (×3): 2 g via INTRAVENOUS
  Filled 2020-08-10: qty 20
  Filled 2020-08-10: qty 2

## 2020-08-10 MED ORDER — ASCORBIC ACID 500 MG PO TABS
1000.0000 mg | ORAL_TABLET | Freq: Every day | ORAL | Status: DC
  Administered 2020-08-11: 1000 mg via ORAL
  Filled 2020-08-10: qty 2

## 2020-08-10 MED ORDER — PHENOL 1.4 % MT LIQD: 1.0000 | OROMUCOSAL | Status: DC | PRN

## 2020-08-10 MED ORDER — TRAMADOL HCL 50 MG PO TABS
50.0000 mg | ORAL_TABLET | Freq: Four times a day (QID) | ORAL | Status: DC | PRN
  Administered 2020-08-10 – 2020-08-11 (×3): 50 mg via ORAL
  Filled 2020-08-10 (×3): qty 1

## 2020-08-10 MED ORDER — NORETHINDRONE-ETH ESTRADIOL 1-5 MG-MCG PO TABS: 1.0000 | ORAL_TABLET | Freq: Every day | ORAL | Status: DC

## 2020-08-10 MED ORDER — ROCURONIUM BROMIDE 100 MG/10ML IV SOLN
INTRAVENOUS | Status: DC | PRN
  Administered 2020-08-10: 10 mg via INTRAVENOUS
  Administered 2020-08-10: 60 mg via INTRAVENOUS

## 2020-08-10 MED ORDER — SIMETHICONE 80 MG PO CHEW
40.0000 mg | CHEWABLE_TABLET | Freq: Four times a day (QID) | ORAL | Status: DC | PRN
  Filled 2020-08-10: qty 1

## 2020-08-10 MED ORDER — LACTATED RINGERS IV SOLN: INTRAVENOUS | Status: DC

## 2020-08-10 MED ORDER — PROMETHAZINE HCL 25 MG/ML IJ SOLN: 6.2500 mg | INTRAMUSCULAR | Status: DC | PRN

## 2020-08-10 MED ORDER — SUGAMMADEX SODIUM 200 MG/2ML IV SOLN
INTRAVENOUS | Status: DC | PRN
  Administered 2020-08-10: 150 mg via INTRAVENOUS

## 2020-08-10 MED ORDER — LIP MEDEX EX OINT
1.0000 "application " | TOPICAL_OINTMENT | Freq: Two times a day (BID) | CUTANEOUS | Status: DC
  Administered 2020-08-10 – 2020-08-11 (×2): 1 via TOPICAL

## 2020-08-10 MED ORDER — HYDROCORTISONE 1 % EX CREA
1.0000 "application " | TOPICAL_CREAM | Freq: Three times a day (TID) | CUTANEOUS | Status: DC | PRN
  Filled 2020-08-10: qty 28

## 2020-08-10 MED ORDER — FERROUS SULFATE 325 (65 FE) MG PO TABS
325.0000 mg | ORAL_TABLET | ORAL | Status: DC
  Administered 2020-08-11: 325 mg via ORAL
  Filled 2020-08-10: qty 1

## 2020-08-10 MED ORDER — DIPHENHYDRAMINE HCL 25 MG PO CAPS: 25.0000 mg | ORAL_CAPSULE | Freq: Four times a day (QID) | ORAL | Status: DC | PRN

## 2020-08-10 MED ORDER — MENTHOL 3 MG MT LOZG: 1.0000 | LOZENGE | OROMUCOSAL | Status: DC | PRN

## 2020-08-10 MED ORDER — ONDANSETRON HCL 4 MG/2ML IJ SOLN
INTRAMUSCULAR | Status: DC | PRN
  Administered 2020-08-10: 4 mg via INTRAVENOUS

## 2020-08-10 MED ORDER — LACTATED RINGERS IR SOLN
Status: DC | PRN
  Administered 2020-08-10 (×3): 1000 mL

## 2020-08-10 MED ORDER — CHLORHEXIDINE GLUCONATE 0.12 % MT SOLN
15.0000 mL | Freq: Once | OROMUCOSAL | Status: AC
  Administered 2020-08-10: 15 mL via OROMUCOSAL

## 2020-08-10 MED ORDER — ONDANSETRON HCL 4 MG/2ML IJ SOLN
4.0000 mg | Freq: Four times a day (QID) | INTRAMUSCULAR | Status: DC | PRN
  Administered 2020-08-10: 4 mg via INTRAVENOUS
  Filled 2020-08-10: qty 2

## 2020-08-10 MED ORDER — 0.9 % SODIUM CHLORIDE (POUR BTL) OPTIME
TOPICAL | Status: DC | PRN
  Administered 2020-08-10: 1000 mL

## 2020-08-10 MED ORDER — LACTATED RINGERS IV BOLUS: 1000.0000 mL | Freq: Three times a day (TID) | INTRAVENOUS | Status: DC | PRN

## 2020-08-10 MED ORDER — HYDROMORPHONE HCL 1 MG/ML IJ SOLN
0.5000 mg | INTRAMUSCULAR | Status: DC | PRN
  Administered 2020-08-10 (×2): 0.5 mg via INTRAVENOUS
  Filled 2020-08-10: qty 0.5
  Filled 2020-08-10: qty 1

## 2020-08-10 MED ORDER — METHOCARBAMOL 500 MG PO TABS: 1000.0000 mg | ORAL_TABLET | Freq: Four times a day (QID) | ORAL | Status: DC | PRN

## 2020-08-10 MED ORDER — HYDROMORPHONE HCL 1 MG/ML IJ SOLN
0.2500 mg | INTRAMUSCULAR | Status: DC | PRN
Start: 2020-08-10 — End: 2020-08-10

## 2020-08-10 MED ORDER — AMISULPRIDE (ANTIEMETIC) 5 MG/2ML IV SOLN: 10.0000 mg | Freq: Once | INTRAVENOUS | Status: DC | PRN

## 2020-08-10 MED ORDER — MAGIC MOUTHWASH
15.0000 mL | Freq: Four times a day (QID) | ORAL | Status: DC | PRN
  Filled 2020-08-10: qty 15

## 2020-08-10 MED ORDER — BUPIVACAINE-EPINEPHRINE (PF) 0.25% -1:200000 IJ SOLN
INTRAMUSCULAR | Status: AC
  Filled 2020-08-10: qty 60

## 2020-08-10 MED ORDER — HYDROCORTISONE (PERIANAL) 2.5 % EX CREA
1.0000 "application " | TOPICAL_CREAM | Freq: Four times a day (QID) | CUTANEOUS | Status: DC | PRN
  Filled 2020-08-10: qty 28.35

## 2020-08-10 MED ORDER — FENTANYL CITRATE (PF) 250 MCG/5ML IJ SOLN
INTRAMUSCULAR | Status: AC
  Filled 2020-08-10: qty 5

## 2020-08-10 MED ORDER — PROPOFOL 10 MG/ML IV BOLUS
INTRAVENOUS | Status: DC | PRN
  Administered 2020-08-10: 60 mg via INTRAVENOUS
  Administered 2020-08-10: 140 mg via INTRAVENOUS

## 2020-08-10 MED ORDER — LABETALOL HCL 5 MG/ML IV SOLN
INTRAVENOUS | Status: DC | PRN
  Administered 2020-08-10: 5 mg via INTRAVENOUS

## 2020-08-10 MED ORDER — LIDOCAINE 2% (20 MG/ML) 5 ML SYRINGE
INTRAMUSCULAR | Status: DC | PRN
  Administered 2020-08-10: 100 mg via INTRAVENOUS

## 2020-08-10 MED ORDER — KETOROLAC TROMETHAMINE 30 MG/ML IJ SOLN
INTRAMUSCULAR | Status: DC | PRN
  Administered 2020-08-10: 30 mg via INTRAVENOUS

## 2020-08-10 MED ORDER — DEXAMETHASONE SODIUM PHOSPHATE 10 MG/ML IJ SOLN
INTRAMUSCULAR | Status: DC | PRN
  Administered 2020-08-10: 10 mg via INTRAVENOUS

## 2020-08-10 MED ORDER — PROPOFOL 500 MG/50ML IV EMUL
INTRAVENOUS | Status: DC | PRN
  Administered 2020-08-10: 160 ug/kg/min via INTRAVENOUS

## 2020-08-10 MED ORDER — GUAIFENESIN-DM 100-10 MG/5ML PO SYRP: 10.0000 mL | ORAL_SOLUTION | ORAL | Status: DC | PRN

## 2020-08-10 MED ORDER — ACETAMINOPHEN 10 MG/ML IV SOLN: 1000.0000 mg | Freq: Once | INTRAVENOUS | Status: DC | PRN

## 2020-08-10 MED ORDER — ENOXAPARIN SODIUM 40 MG/0.4ML ~~LOC~~ SOLN
40.0000 mg | SUBCUTANEOUS | Status: DC
  Administered 2020-08-11: 40 mg via SUBCUTANEOUS
  Filled 2020-08-10: qty 0.4

## 2020-08-10 MED ORDER — FENTANYL CITRATE (PF) 250 MCG/5ML IJ SOLN
INTRAMUSCULAR | Status: DC | PRN
  Administered 2020-08-10 (×2): 50 ug via INTRAVENOUS
  Administered 2020-08-10: 100 ug via INTRAVENOUS
  Administered 2020-08-10 (×2): 50 ug via INTRAVENOUS

## 2020-08-10 MED ORDER — FENTANYL CITRATE (PF) 100 MCG/2ML IJ SOLN
INTRAMUSCULAR | Status: AC
  Filled 2020-08-10: qty 2

## 2020-08-10 MED ORDER — ONDANSETRON 4 MG PO TBDP: 4.0000 mg | ORAL_TABLET | Freq: Four times a day (QID) | ORAL | Status: DC | PRN

## 2020-08-10 MED ORDER — DOCUSATE SODIUM 100 MG PO CAPS
100.0000 mg | ORAL_CAPSULE | Freq: Two times a day (BID) | ORAL | Status: DC
  Administered 2020-08-10 – 2020-08-11 (×4): 100 mg via ORAL
  Filled 2020-08-10 (×4): qty 1

## 2020-08-10 MED ORDER — KCL IN DEXTROSE-NACL 20-5-0.45 MEQ/L-%-% IV SOLN
INTRAVENOUS | Status: DC
  Filled 2020-08-10 (×3): qty 1000

## 2020-08-10 MED ORDER — GABAPENTIN 400 MG PO CAPS
800.0000 mg | ORAL_CAPSULE | Freq: Every day | ORAL | Status: DC
  Administered 2020-08-10 (×2): 800 mg via ORAL
  Filled 2020-08-10: qty 8
  Filled 2020-08-10: qty 2

## 2020-08-10 MED ORDER — LORATADINE 10 MG PO TABS
10.0000 mg | ORAL_TABLET | Freq: Every day | ORAL | Status: DC
  Administered 2020-08-10 – 2020-08-11 (×2): 10 mg via ORAL
  Filled 2020-08-10 (×2): qty 1

## 2020-08-10 MED ORDER — ACETAMINOPHEN 325 MG PO TABS
650.0000 mg | ORAL_TABLET | Freq: Four times a day (QID) | ORAL | Status: DC
  Administered 2020-08-10 – 2020-08-11 (×4): 650 mg via ORAL
  Filled 2020-08-10 (×4): qty 2

## 2020-08-10 MED ORDER — SODIUM CHLORIDE 0.9 % IV SOLN
INTRAVENOUS | Status: DC | PRN
  Administered 2020-08-10: 10 mL

## 2020-08-10 MED ORDER — MIDAZOLAM HCL 2 MG/2ML IJ SOLN
INTRAMUSCULAR | Status: AC
  Filled 2020-08-10: qty 2

## 2020-08-10 MED ORDER — BUPIVACAINE LIPOSOME 1.3 % IJ SUSP
20.0000 mL | Freq: Once | INTRAMUSCULAR | Status: AC
  Administered 2020-08-10: 20 mL
  Filled 2020-08-10: qty 20

## 2020-08-10 MED ORDER — BISMUTH SUBSALICYLATE 262 MG/15ML PO SUSP
30.0000 mL | Freq: Three times a day (TID) | ORAL | Status: DC | PRN
  Filled 2020-08-10: qty 236

## 2020-08-10 MED ORDER — METHOCARBAMOL 1000 MG/10ML IJ SOLN
1000.0000 mg | Freq: Four times a day (QID) | INTRAVENOUS | Status: DC | PRN
  Filled 2020-08-10: qty 10

## 2020-08-10 MED ORDER — LIP MEDEX EX OINT
TOPICAL_OINTMENT | CUTANEOUS | Status: AC
  Filled 2020-08-10: qty 7

## 2020-08-10 MED ORDER — BUPIVACAINE-EPINEPHRINE 0.25% -1:200000 IJ SOLN
INTRAMUSCULAR | Status: DC | PRN
  Administered 2020-08-10: 60 mL

## 2020-08-10 MED ORDER — MIDAZOLAM HCL 2 MG/2ML IJ SOLN
INTRAMUSCULAR | Status: DC | PRN
  Administered 2020-08-10 (×2): 1 mg via INTRAVENOUS

## 2020-08-10 MED ORDER — BUSPIRONE HCL 10 MG PO TABS
20.0000 mg | ORAL_TABLET | Freq: Two times a day (BID) | ORAL | Status: DC
  Administered 2020-08-10 – 2020-08-11 (×4): 20 mg via ORAL
  Filled 2020-08-10 (×5): qty 2

## 2020-08-10 MED ORDER — BISACODYL 10 MG RE SUPP: 10.0000 mg | Freq: Two times a day (BID) | RECTAL | Status: DC | PRN

## 2020-08-10 SURGICAL SUPPLY — 43 items
APPLIER CLIP 5 13 M/L LIGAMAX5 (MISCELLANEOUS) ×2
CABLE HIGH FREQUENCY MONO STRZ (ELECTRODE) ×2 IMPLANT
CHLORAPREP W/TINT 26 (MISCELLANEOUS) ×2 IMPLANT
CLIP APPLIE 5 13 M/L LIGAMAX5 (MISCELLANEOUS) ×1 IMPLANT
COVER MAYO STAND STRL (DRAPES) ×2 IMPLANT
COVER SURGICAL LIGHT HANDLE (MISCELLANEOUS) ×2 IMPLANT
COVER WAND RF STERILE (DRAPES) IMPLANT
DECANTER SPIKE VIAL GLASS SM (MISCELLANEOUS) ×2 IMPLANT
DRAIN CHANNEL 19F RND (DRAIN) IMPLANT
DRAPE C-ARM 42X120 X-RAY (DRAPES) ×2 IMPLANT
DRAPE WARM FLUID 44X44 (DRAPES) ×2 IMPLANT
DRSG TEGADERM 4X4.75 (GAUZE/BANDAGES/DRESSINGS) ×2 IMPLANT
ELECT REM PT RETURN 15FT ADLT (MISCELLANEOUS) ×2 IMPLANT
ENDOLOOP SUT PDS II  0 18 (SUTURE) ×2
ENDOLOOP SUT PDS II 0 18 (SUTURE) ×1 IMPLANT
EVACUATOR SILICONE 100CC (DRAIN) IMPLANT
GAUZE SPONGE 2X2 8PLY STRL LF (GAUZE/BANDAGES/DRESSINGS) ×1 IMPLANT
GLOVE SURG LTX SZ8 (GLOVE) ×2 IMPLANT
GLOVE SURG UNDER LTX SZ8 (GLOVE) ×2 IMPLANT
GOWN STRL REUS W/TWL XL LVL3 (GOWN DISPOSABLE) ×4 IMPLANT
IRRIG SUCT STRYKERFLOW 2 WTIP (MISCELLANEOUS) ×2
IRRIGATION SUCT STRKRFLW 2 WTP (MISCELLANEOUS) ×1 IMPLANT
KIT BASIN OR (CUSTOM PROCEDURE TRAY) ×2 IMPLANT
KIT TURNOVER KIT A (KITS) ×2 IMPLANT
PAD POSITIONING PINK XL (MISCELLANEOUS) ×2 IMPLANT
PENCIL SMOKE EVACUATOR (MISCELLANEOUS) IMPLANT
POUCH RETRIEVAL ECOSAC 10 (ENDOMECHANICALS) IMPLANT
POUCH RETRIEVAL ECOSAC 10MM (ENDOMECHANICALS)
PROTECTOR NERVE ULNAR (MISCELLANEOUS) IMPLANT
SCISSORS LAP 5X35 DISP (ENDOMECHANICALS) ×2 IMPLANT
SET CHOLANGIOGRAPH MIX (MISCELLANEOUS) ×2 IMPLANT
SET TUBE SMOKE EVAC HIGH FLOW (TUBING) ×2 IMPLANT
SHEARS HARMONIC ACE PLUS 36CM (ENDOMECHANICALS) ×2 IMPLANT
SPONGE GAUZE 2X2 8PLY STRL LF (GAUZE/BANDAGES/DRESSINGS) ×2 IMPLANT
SPONGE GAUZE 2X2 STER 10/PKG (GAUZE/BANDAGES/DRESSINGS) ×1
SUT MNCRL AB 4-0 PS2 18 (SUTURE) ×2 IMPLANT
SUT PDS AB 1 CT1 27 (SUTURE) ×8 IMPLANT
SYR 20ML LL LF (SYRINGE) ×2 IMPLANT
TOWEL OR 17X26 10 PK STRL BLUE (TOWEL DISPOSABLE) ×2 IMPLANT
TOWEL OR NON WOVEN STRL DISP B (DISPOSABLE) ×2 IMPLANT
TRAY LAPAROSCOPIC (CUSTOM PROCEDURE TRAY) ×2 IMPLANT
TROCAR BLADELESS OPT 5 100 (ENDOMECHANICALS) ×2 IMPLANT
TROCAR BLADELESS OPT 5 150 (ENDOMECHANICALS) ×2 IMPLANT

## 2020-08-10 NOTE — Transfer of Care (Signed)
Immediate Anesthesia Transfer of Care Note  Patient: Elizabeth [name]  Procedure(s) Performed: LAPAROSCOPIC CHOLECYSTECTOMY SINGLE SITE (N/A Abdomen)  Patient Location: PACU  Anesthesia Type:General  Level of Consciousness: awake, alert , oriented and patient cooperative  Airway & Oxygen Therapy: Patient Spontanous Breathing and Patient connected to face mask oxygen  Post-op Assessment: Report given to RN, Post -op Vital signs reviewed and stable and Patient moving all extremities X 4  Post vital signs: stable  Last Vitals:  Vitals Value Taken Time  BP 143/89 08/10/20 1645  Temp    Pulse 88 08/10/20 1652  Resp 16 08/10/20 1652  SpO2 91 % 08/10/20 1652  Vitals shown include unvalidated device data.  Last Pain:  Vitals:   08/10/20 1406  TempSrc:   PainSc: 3       Patients Stated Pain Goal: 2 (12/14/80 8833)  Complications: No complications documented.

## 2020-08-10 NOTE — Anesthesia Procedure Notes (Signed)
Procedure Name: Intubation Date/Time: 08/10/2020 3:13 PM Performed by: Sharlette Dense, CRNA Patient Re-evaluated:Patient Re-evaluated prior to induction Oxygen Delivery Method: Circle system utilized Preoxygenation: Pre-oxygenation with 100% oxygen Induction Type: IV induction Ventilation: Mask ventilation without difficulty Laryngoscope Size: Miller and 2 Grade View: Grade I Tube type: Oral Tube size: 7.0 mm Number of attempts: 1 Airway Equipment and Method: Stylet Placement Confirmation: ETT inserted through vocal cords under direct vision,  positive ETCO2 and breath sounds checked- equal and bilateral Secured at: 21 cm Tube secured with: Tape Dental Injury: Teeth and Oropharynx as per pre-operative assessment

## 2020-08-10 NOTE — Op Note (Signed)
08/10/2020  PATIENT:  Elizabeth [name]  60 y.o. female  Patient Care Team: Hali Marry, MD as PCP - General (Family Medicine) Mickel Fuchs, MD as Referring Physician (Neurology) Gustavo Lah, NP as Referring Physician (Obstetrics and Gynecology)  PRE-OPERATIVE DIAGNOSIS:    Acute on Chronic Calculus Cholecystitis  POST-OPERATIVE DIAGNOSIS:   Acute on Chronic Calculus Cholecystitis  PROCEDURE:  SINGLE SITE Laparoscopic cholecystectomy (CPT code [phone])  SURGEON:  Adin Hector, MD, FACS.  ASSISTANT: OR Staff   ANESTHESIA:    General with endotracheal intubation Local anesthetic as a field block  EBL:  (See Anesthesia Intraoperative Record) Total I/O In: 3350 [I.V.:1000; IV Piggyback:2350] Out: -   Delay start of Pharmacological VTE agent (>24hrs) due to surgical blood loss or risk of bleeding:  no  DRAINS: None   SPECIMEN: Gallbladder    DISPOSITION OF SPECIMEN:  PATHOLOGY  COUNTS:  YES  PLAN OF CARE: Admit to inpatient   PATIENT DISPOSITION:  PACU - hemodynamically stable.  INDICATION: Pleasant woman with pain and probable biliary colic.  Gallbladder attack would not go away.  Examination concerning for cholecystitis.  Recommendation made for hospitalization, IV antibiotics, nausea/pain control.  Cholecystectomy.  The anatomy & physiology of hepatobiliary & pancreatic function was discussed.  The pathophysiology of gallbladder dysfunction was discussed.  Natural history risks without surgery was discussed.   I feel the risks of no intervention will lead to serious problems that outweigh the operative risks; therefore, I recommended cholecystectomy to remove the pathology.  I explained laparoscopic techniques with possible need for an open approach.  Probable cholangiogram to evaluate the bilary tract was explained as well.    Risks such as bleeding, infection, abscess, leak, injury to other organs, need for further treatment, heart attack, death, and  other risks were discussed.  I noted a good likelihood this will help address the problem.  Possibility that this will not correct all abdominal symptoms was explained.  Goals of post-operative recovery were discussed as well.  We will work to minimize complications.  An educational handout further explaining the pathology and treatment options was given as well.  Questions were answered.  The patient expresses understanding & wishes to proceed with surgery.  OR FINDINGS: Very dilated gallbladder with chronic changes and edema consistent with acute on chronic cholecystitis.  Very foreshortened cystic duct and not able to pass cholangiogram catheter in suspicious for cystic duct obstruction.  No cholangiogram done.  Liver: normal  DESCRIPTION:   The patient was identified & brought in the operating room. The patient was positioned supine with arms tucked. SCDs were active during the entire case. The patient underwent general anesthesia without any difficulty.  The abdomen was prepped and draped in a sterile fashion. A Surgical Timeout confirmed our plan.  I made a transverse curvilinear incision through the superior umbilical fold.  I placed a 41mm long port through the supraumbilical fascia using a modified Hassan cutdown technique with umbilical stalk fascial countertraction. I began carbon dioxide insufflation.  No change in end tidal CO2 measurement.   Camera inspection revealed no injury. There were no adhesions to the anterior abdominal wall supraumbilically.  I proceeded to continue with single site technique. I placed a #5 port in left upper aspect of the wound. I placed a 5 mm atraumatic grasper in the right inferior aspect of the wound.  I turned attention to the right upper quadrant.  Gallbladder was turgid and dilated and edematous.  I cannot grab it.  He  is a laparoscopic needle aspiration to decompress the gallbladder.  Very thickened and edematous with numerous stones within it.  However  that allowed me to have more mobility.  I freed off some adhesions between the liver and mesocolon and omentum.  The gallbladder fundus was elevated cephalad. I freed adhesions to the ventral surface of the gallbladder off carefully.  I freed the peritoneal coverings between the gallbladder and the liver on the posteriolateral and anteriomedial walls. I alternated between Harmonic & blunt Maryland dissection to help get a good critical view of the cystic artery and cystic duct.  did further dissection to free 80% of the gallbladder off the liver bed to get a good critical view of the infundibulum and cystic duct. I dissected out the cystic artery; and, after getting a good 360 view, ligated the anterior & posterior branches of the cystic artery close on the infundibulum using the Harmonic ultrasonic dissection.  I skeletonized the cystic duct.  I placed a clip on the infundibulum. I did a partial cystic duct-otomy and ensured patency. I placed a 5 [place] cholangiocatheter through a puncture site at the right subcostal ridge of the abdominal wall and directed it into the cystic duct.  Cystic duct was quite edematous and foreshortened.  I try to flush and adjust and milked back any stones but I could not get it to flow well with a saline flush so I held off on cholangiogram.  I removed the cholangiogram catheter.  I freed the gallbladder for its meaning attachments on the liver bed.  I lighted the cystic duct with a 0 PDS endoloop to lasso around the entire gallbladder and coming down to the cystic duct to ligate close to its junction with the common bile duct.  Placed some clips on the short cystic duct as well.  Completed cystic duct transection.    I ensured hemostasis on the gallbladder fossa of the liver and elsewhere. . I placed the gallbladder inside and ecosac I did copious irrigation to ensure clean return.  Cystic duct stump with good ligation.  No leaking of bile nor bleeding.  I inspected the rest of  the abdomen & detected no injury nor bleeding elsewhere.  I removed the gallbladder out the supraumbilical fascia.  I did have to open up the fascia to 2.5 cm to get the gallbladder and its thickened wall and stones out.  I closed the fascia transversely using #1 PDS interrupted stitches. I closed the skin using 4-0 monocryl stitch.  Sterile dressing was applied. The patient was extubated & arrived in the PACU in stable condition..  I had discussed postoperative care with the patient in the holding area. I discussed operative findings, updated the patient's status, discussed probable steps to recovery, and gave postoperative recommendations to the patient's friend, Jenell Milliner.  Recommendations were made.  Questions were answered.  She expressed understanding & appreciation.  Adin Hector, M.D., F.A.C.S. Gastrointestinal and Minimally Invasive Surgery [facility] [address], [place], [place] [address] [phone] Main / Paging  08/10/2020 4:45 PM

## 2020-08-10 NOTE — Discharge Instructions (Signed)
[facility]  Please arrive at least 30 min before your appointment to complete your check in paperwork.  If you are unable to arrive 30 min prior to your appointment time we may have to cancel or reschedule you. LAPAROSCOPIC SURGERY: POST OP INSTRUCTIONS Always review your discharge instruction sheet given to you by the facility where your surgery was performed. IF YOU HAVE DISABILITY OR FAMILY LEAVE FORMS, YOU MUST BRING THEM TO THE OFFICE FOR PROCESSING.   DO NOT GIVE THEM TO YOUR DOCTOR.  PAIN CONTROL  1. First take acetaminophen (Tylenol) AND/or ibuprofen (Advil) to control your pain after surgery.  Follow directions on package.  Taking acetaminophen (Tylenol) and/or ibuprofen (Advil) regularly after surgery will help to control your pain and lower the amount of prescription pain medication you may need.  You should not take more than 4,000 mg (4 grams) of acetaminophen (Tylenol) in 24 hours.  You should not take ibuprofen (Advil), aleve, motrin, naprosyn or other NSAIDS if you have a history of stomach ulcers or chronic kidney disease.  2. A prescription for pain medication may be given to you upon discharge.  Take your pain medication as prescribed, if you still have uncontrolled pain after taking acetaminophen (Tylenol) or ibuprofen (Advil). 3. Use ice packs to help control pain. 4. If you need a refill on your pain medication, please contact your pharmacy.  They will contact our office to request authorization. Prescriptions will not be filled after 5pm or on week-ends.  HOME MEDICATIONS 5. Take your usually prescribed medications unless otherwise directed.  DIET 6. You should follow a light diet the first few days after arrival home.  Be sure to include lots of fluids daily. Avoid fatty, fried foods.   CONSTIPATION 7. It is common to experience some constipation after surgery and if you are taking pain medication.  Increasing fluid intake and taking a stool  softener (such as Colace) will usually help or prevent this problem from occurring.  A mild laxative (Milk of Magnesia or Miralax) should be taken according to package instructions if there are no bowel movements after 48 hours.  WOUND/INCISION CARE 8. Most patients will experience some swelling and bruising in the area of the incisions.  Ice packs will help.  Swelling and bruising can take several days to resolve.  9. Unless discharge instructions indicate otherwise, follow guidelines below  a. STERI-STRIPS - you may remove your outer bandages 48 hours after surgery, and you may shower at that time.  You have steri-strips (small skin tapes) in place directly over the incision.  These strips should be left on the skin for 7-10 days.   b. DERMABOND/SKIN GLUE - you may shower in 24 hours.  The glue will flake off over the next 2-3 weeks. 10. Any sutures or staples will be removed at the office during your follow-up visit.  ACTIVITIES 11. You may resume regular (light) daily activities beginning the next day--such as daily self-care, walking, climbing stairs--gradually increasing activities as tolerated.  You may have sexual intercourse when it is comfortable.  Refrain from any heavy lifting or straining until approved by your doctor. a. You may drive when you are no longer taking prescription pain medication, you can comfortably wear a seatbelt, and you can safely maneuver your car and apply brakes.  FOLLOW-UP 12. You should see your doctor in the office for a follow-up appointment approximately 2-3 weeks after your surgery.  You should have been given your post-op/follow-up appointment when   your surgery was scheduled.  If you did not receive a post-op/follow-up appointment, make sure that you call for this appointment within a day or two after you arrive home to insure a convenient appointment time.   WHEN TO CALL YOUR DOCTOR: 1. Fever over 101.0 2. Inability to urinate 3. Continued bleeding from  incision. 4. Increased pain, redness, or drainage from the incision. 5. Increasing abdominal pain  The clinic staff is available to answer your questions during regular business hours.  Please don't hesitate to call and ask to speak to one of the nurses for clinical concerns.  If you have a medical emergency, go to the nearest emergency room or call 911.  A surgeon from [facility] is always on call at the hospital. [address], [address], [place], [place]  [address] ? [address] [address], [place], [place]   [address] [phone] ? [phone] ? FAX [phone]  .........   Managing Your Pain After Surgery Without Opioids    Thank you for participating in our program to help patients manage their pain after surgery without opioids. This is part of our effort to provide you with the best care possible, without exposing you or your family to the risk that opioids pose.  What pain can I expect after surgery? You can expect to have some pain after surgery. This is normal. The pain is typically worse the day after surgery, and quickly begins to get better. Many studies have found that many patients are able to manage their pain after surgery with Over-the-Counter (OTC) medications such as Tylenol and Motrin. If you have a condition that does not allow you to take Tylenol or Motrin, notify your surgical team.  How will I manage my pain? The best strategy for controlling your pain after surgery is around the clock pain control with Tylenol (acetaminophen) and Motrin (ibuprofen or Advil). Alternating these medications with each other allows you to maximize your pain control. In addition to Tylenol and Motrin, you can use heating pads or ice packs on your incisions to help reduce your pain.  How will I alternate your regular strength over-the-counter pain medication? You will take a dose of pain medication every three hours. ; Start by taking 650 mg of Tylenol (2 pills of 325  mg) ; 3 hours later take 600 mg of Motrin (3 pills of 200 mg) ; 3 hours after taking the Motrin take 650 mg of Tylenol ; 3 hours after that take 600 mg of Motrin.   - 1 -  See example - if your first dose of Tylenol is at 12:00 PM   12:00 PM Tylenol 650 mg (2 pills of 325 mg)  3:00 PM Motrin 600 mg (3 pills of 200 mg)  6:00 PM Tylenol 650 mg (2 pills of 325 mg)  9:00 PM Motrin 600 mg (3 pills of 200 mg)  Continue alternating every 3 hours   We recommend that you follow this schedule around-the-clock for at least 3 days after surgery, or until you feel that it is no longer needed. Use the table on the last page of this handout to keep track of the medications you are taking. Important: Do not take more than 3000mg of Tylenol or 3200mg of Motrin in a 24-hour period. Do not take ibuprofen/Motrin if you have a history of bleeding stomach ulcers, severe kidney disease, &/or actively taking a blood thinner  What if I still have pain? If you have pain that is not   controlled with the over-the-counter pain medications (Tylenol and Motrin or Advil) you might have what we call "breakthrough" pain. You will receive a prescription for a small amount of an opioid pain medication such as Oxycodone, Tramadol, or Tylenol with Codeine. Use these opioid pills in the first 24 hours after surgery if you have breakthrough pain. Do not take more than 1 pill every 4-6 hours.  If you still have uncontrolled pain after using all opioid pills, don't hesitate to call our staff using the number provided. We will help make sure you are managing your pain in the best way possible, and if necessary, we can provide a prescription for additional pain medication.   Day 1    Time  Name of Medication Number of pills taken  Amount of Acetaminophen  Pain Level   Comments  AM PM       AM PM       AM PM       AM PM       AM PM       AM PM       AM PM       AM PM       Total Daily amount of Acetaminophen Do not  take more than  3,000 mg per day      Day 2    Time  Name of Medication Number of pills taken  Amount of Acetaminophen  Pain Level   Comments  AM PM       AM PM       AM PM       AM PM       AM PM       AM PM       AM PM       AM PM       Total Daily amount of Acetaminophen Do not take more than  3,000 mg per day      Day 3    Time  Name of Medication Number of pills taken  Amount of Acetaminophen  Pain Level   Comments  AM PM       AM PM       AM PM       AM PM          AM PM       AM PM       AM PM       AM PM       Total Daily amount of Acetaminophen Do not take more than  3,000 mg per day      Day 4    Time  Name of Medication Number of pills taken  Amount of Acetaminophen  Pain Level   Comments  AM PM       AM PM       AM PM       AM PM       AM PM       AM PM       AM PM       AM PM       Total Daily amount of Acetaminophen Do not take more than  3,000 mg per day      Day 5    Time  Name of Medication Number of pills taken  Amount of Acetaminophen  Pain Level   Comments  AM PM       AM PM       AM   PM       AM PM       AM PM       AM PM       AM PM       AM PM       Total Daily amount of Acetaminophen Do not take more than  3,000 mg per day       Day 6    Time  Name of Medication Number of pills taken  Amount of Acetaminophen  Pain Level  Comments  AM PM       AM PM       AM PM       AM PM       AM PM       AM PM       AM PM       AM PM       Total Daily amount of Acetaminophen Do not take more than  3,000 mg per day      Day 7    Time  Name of Medication Number of pills taken  Amount of Acetaminophen  Pain Level   Comments  AM PM       AM PM       AM PM       AM PM       AM PM       AM PM       AM PM       AM PM       Total Daily amount of Acetaminophen Do not take more than  3,000 mg per day        For additional information about how and where to safely dispose of unused  opioid medications - https://www.morepowerfulnc.org  Disclaimer: This document contains information and/or instructional materials adapted from [facility] for the typical patient with your condition. It does not replace medical advice from your health care provider because your experience may differ from that of the typical patient. Talk to your health care provider if you have any questions about this document, your condition or your treatment plan. Adapted from [facility]   

## 2020-08-10 NOTE — Anesthesia Preprocedure Evaluation (Addendum)
Anesthesia Evaluation  Patient identified by MRN, date of birth, ID band Patient awake    Reviewed: Allergy & Precautions, NPO status , Patient's Chart, lab work & pertinent test results  History of Anesthesia Complications Negative for: history of anesthetic complications  Airway Mallampati: I  TM Distance: >3 FB Neck ROM: Full    Dental  (+) Dental Advisory Given, Teeth Intact   Pulmonary neg pulmonary ROS,    breath sounds clear to auscultation       Cardiovascular negative cardio ROS   Rhythm:Regular     Neuro/Psych PSYCHIATRIC DISORDERS Anxiety Depression negative neurological ROS     GI/Hepatic Neg liver ROS, Acute cholecystitis    Endo/Other  negative endocrine ROS  Renal/GU negative Renal ROS     Musculoskeletal negative musculoskeletal ROS (+)   Abdominal   Peds  Hematology negative hematology ROS (+)   Anesthesia Other Findings   Reproductive/Obstetrics fibroids                           Anesthesia Physical Anesthesia Plan  ASA: II  Anesthesia Plan: General   Post-op Pain Management:    Induction: Intravenous  PONV Risk Score and Plan: 3 and Ondansetron, Dexamethasone, Midazolam and Treatment may vary due to age or medical condition  Airway Management Planned: Mask and Oral ETT  Additional Equipment: None  Intra-op Plan:   Post-operative Plan: Extubation in OR  Informed Consent: I have reviewed the patients History and Physical, chart, labs and discussed the procedure including the risks, benefits and alternatives for the proposed anesthesia with the patient or authorized representative who has indicated his/her understanding and acceptance.     Dental advisory given  Plan Discussed with: CRNA  Anesthesia Plan Comments: (Lab Results      Component                Value               Date                      WBC                      11.8 (H)             08/10/2020                HGB                      13.4                08/10/2020                HCT                      40.5                08/10/2020                MCV                      92.3                08/10/2020                PLT  195                 08/10/2020           Lab Results      Component                Value               Date                      NA                       142                 08/10/2020                K                        3.5                 08/10/2020                CO2                      21 (L)              08/10/2020                GLUCOSE                  148 (H)             08/10/2020                BUN                      12                  08/10/2020                CREATININE               0.84                08/10/2020                CALCIUM                  8.8 (L)             08/10/2020                GFRNONAA                 >60                 08/10/2020                GFRAA                    65                  12/08/2019          )        Anesthesia Quick Evaluation

## 2020-08-10 NOTE — Anesthesia Postprocedure Evaluation (Signed)
Anesthesia Post Note  Patient: Elizabeth [name]  Procedure(s) Performed: LAPAROSCOPIC CHOLECYSTECTOMY SINGLE SITE (N/A Abdomen)     Patient location during evaluation: PACU Anesthesia Type: General Level of consciousness: awake and alert Pain management: pain level controlled Vital Signs Assessment: post-procedure vital signs reviewed and stable Respiratory status: spontaneous breathing, nonlabored ventilation, respiratory function stable and patient connected to nasal cannula oxygen Cardiovascular status: blood pressure returned to baseline and stable Postop Assessment: no apparent nausea or vomiting Anesthetic complications: no   No complications documented.  Last Vitals:  Vitals:   08/10/20 1645 08/10/20 1700  BP: (!) 143/89 (!) 136/96  Pulse: 86 80  Resp: 20 19  Temp:    SpO2: 97% 96%    Last Pain:  Vitals:   08/10/20 1406  TempSrc:   PainSc: 3                  Malyna Budney

## 2020-08-10 NOTE — Interval H&P Note (Signed)
History and Physical Interval Note:  08/10/2020 2:19 PM  Elizabeth [name]  has presented today for surgery, with the diagnosis of acute cholecystitis.  The various methods of treatment have been discussed with the patient and family. After consideration of risks, benefits and other options for treatment, the patient has consented to  Procedure(s): [place] CHOLANGIOGRAM (N/A) as a surgical intervention.  The patient's history has been reviewed, patient examined, no change in status, stable for surgery.  I have reviewed the patient's chart and labs.  Questions were answered to the patient's satisfaction.    I have re-reviewed the the patient's records, history, medications, and allergies.  I have re-examined the patient.  I again discussed intraoperative plans and goals of post-operative recovery.  The patient agrees to proceed.  Elizabeth [name]  [birth year] [id number]  Patient Care Team: Hali Marry, MD as PCP - General (Family Medicine) Mickel Fuchs, MD as Referring Physician (Neurology) Gustavo Lah, NP as Referring Physician (Obstetrics and Gynecology)  Patient Active Problem List   Diagnosis Date Noted   Cholecystitis 08/09/2020   RLS (restless legs syndrome) 05/15/2011   UNSPECIFIED VITAMIN D DEFICIENCY 04/04/2010   PARESTHESIA 12/13/2009   DEPRESSION 08/17/2008   INSOMNIA 08/17/2008   ALLERGIC RHINITIS 09/16/2007   IMPAIRED FASTING GLUCOSE 09/16/2007   ANXIETY STATE, UNSPECIFIED 07/15/2007    Past Medical History:  Diagnosis Date   Allergy    Anxiety    Depression    Fibroid, uterine    Neuromuscular disorder ([place])    RLS    Past Surgical History:  Procedure Laterality Date   DILATION AND CURETTAGE OF UTERUS     x 2 for bleeding   INGUINAL HERNIA REPAIR     bilat at 62month.    LASER ABLATION OF THE CERVIX     TEAR DUCT PROBING     age 38    TONSILLECTOMY AND ADENOIDECTOMY     age 83     Social  History   Socioeconomic History   Marital status: Divorced    Spouse name: Not on file   Number of children: 0   Years of education: Not on file   Highest education level: Not on file  Occupational History   Occupation: VETERINARIAN    Employer: [employer]  Tobacco Use   Smoking status: Never Smoker   Smokeless tobacco: Never Used  Vaping Use   Vaping Use: Never used  Substance and Sexual Activity   Alcohol use: Yes    Comment: Rarely.    Drug use: No   Sexual activity: Not Currently  Other Topics Concern   Not on file  Social History Narrative   Some regular exercise.  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   Social Determinants of Health   Financial Resource Strain: Not on file  Food Insecurity: Not on file  Transportation Needs: Not on file  Physical Activity: Not on file  Stress: Not on file  Social Connections: Not on file  Intimate Partner Violence: Not on file    Family History  Problem Relation Age of Onset   Hypertension Father    Heart disease Father    Arthritis Father    Diabetes Father    Heart disease Mother        Septal defect    Cancer Brother        leiiomyoscarcoma   Hypertension Brother    Cancer Brother  giant cell tumor.    Kidney Stones Brother    Colon cancer Neg Hx     Medications Prior to Admission  Medication Sig Dispense Refill Last Dose   Ascorbic Acid (VITAMIN C) 1000 MG tablet Take 1 tablet by mouth daily.   Past Week at Unknown time   aspirin 81 MG tablet Take 81 mg by mouth daily.   Past Week at Unknown time   busPIRone (BUSPAR) 10 MG tablet TAKE TWO TABLETS BY MOUTH TWICE A DAY (Patient taking differently: Take 10 mg by mouth 2 (two) times daily.) 360 tablet 0 08/09/2020 at Unknown time   CAFFEINE PO Take 50 mg by mouth daily.   Past Week at Unknown time   Cholecalciferol (VITAMIN D-3 PO) Take 2,000 Units by mouth daily.   Past Week at Unknown time   Coenzyme Q10 (CO Q 10) 100  MG CAPS Take 100 mg by mouth daily.   Past Week at Unknown time   ferrous sulfate 325 (65 FE) MG tablet Take 325 mg by mouth See admin instructions. Tuesday, Thursday, Saturday and sunday   Past Week at Unknown time   gabapentin (NEURONTIN) 800 MG tablet TAKE ONE TABLET BY MOUTH AT BEDTIME (Patient taking differently: Take 800 mg by mouth at bedtime.) 90 tablet 1 Past Week at Unknown time   Glucosamine 750 MG TABS Take 1 tablet by mouth 2 (two) times daily.   Past Week at Unknown time   loratadine (CLARITIN) 10 MG tablet Take 10 mg by mouth daily.   Past Week at Unknown time   Multiple Vitamin (MULTIVITAMIN) tablet Take 1 tablet by mouth daily.   Past Week at Unknown time   norethindrone-ethinyl estradiol (FEMHRT 1/5) 1-5 MG-MCG TABS tablet Take 1 tablet by mouth daily.   Past Week at Unknown time   vitamin E 180 MG (400 UNITS) capsule Take 400 Units by mouth daily.   Past Week at Unknown time    Current Facility-Administered Medications  Medication Dose Route Frequency Provider Last Rate Last Admin   [MAR Hold] acetaminophen (TYLENOL) tablet 650 mg  650 mg Oral Q6H Norm Parcel, PA-C       [MAR Hold] busPIRone (BUSPAR) tablet 20 mg  20 mg Oral BID Leighton Ruff, MD   20 mg at 08/10/20 0359   [MAR Hold] cefTRIAXone (ROCEPHIN) 2 g in sodium chloride 0.9 % 100 mL IVPB  2 g Intravenous V49S Leighton Ruff, MD 496 mL/hr at 08/10/20 0400 2 g at 08/10/20 0400   dextrose 5 % and 0.45 % NaCl with KCl 20 mEq/L infusion   Intravenous Continuous Leighton Ruff, MD 75 mL/hr at 08/10/20 0400 New Bag at 08/10/20 0400   [MAR Hold] diphenhydrAMINE (BENADRYL) capsule 25 mg  25 mg Oral P5F PRN Leighton Ruff, MD       Or   Doug Sou Hold] diphenhydrAMINE (BENADRYL) injection 25 mg  25 mg Intravenous F6B PRN Leighton Ruff, MD       Doug Sou Hold] docusate sodium (COLACE) capsule 100 mg  100 mg Oral BID Leighton Ruff, MD   846 mg at 08/10/20 0400   [MAR Hold] enoxaparin (LOVENOX) injection 40 mg  40 mg Subcutaneous  K59D Leighton Ruff, MD       [facility] Hold] ferrous sulfate tablet 325 mg  325 mg Oral Once per day on Mon Wed Fri Thomas, Alicia, MD       [facility] Hold] gabapentin (NEURONTIN) capsule 800 mg  800 mg Oral QHS Leighton Ruff, MD  800 mg at 08/10/20 0400   [MAR Hold] HYDROmorphone (DILAUDID) injection 0.5 mg  0.5 mg Intravenous F8H PRN Leighton Ruff, MD   0.5 mg at 08/10/20 1053   lactated ringers infusion   Intravenous Continuous Michael Boston, MD 10 mL/hr at 08/10/20 1359 New Bag at 08/10/20 1359   [MAR Hold] loratadine (CLARITIN) tablet 10 mg  10 mg Oral Daily Leighton Ruff, MD       Doug Sou Hold] norethindrone-ethinyl estradiol (FEMHRT 1/5) 1-5 MG-MCG per tablet 1 tablet  1 tablet Oral Daily Leighton Ruff, MD       [facility] Hold] ondansetron (ZOFRAN-ODT) disintegrating tablet 4 mg  4 mg Oral W2X PRN Leighton Ruff, MD       Or   Doug Sou Hold] ondansetron [facility]) injection 4 mg  4 mg Intravenous H3Z PRN Leighton Ruff, MD   4 mg at 16/96/78 0401   [MAR Hold] simethicone (MYLICON) chewable tablet 40 mg  40 mg Oral L3Y PRN Leighton Ruff, MD       [facility] Hold] traMADol Veatrice Bourbon) tablet 50 mg  50 mg Oral Q6H PRN Norm Parcel, PA-C         Allergies  Allergen Reactions   Dextromethorphan-Guaifenesin     REACTION: hives   Fluoxetine Hcl     REACTION: skin reaction   Wellbutrin [Bupropion] Rash    From head to toe    BP (!) 153/92 (BP Location: Right Arm)   Pulse 86   Temp 99.7 F (37.6 C) (Oral)   Resp 16   Ht 5\' 4"  (1.626 m)   Wt 77.1 kg   SpO2 98%   BMI 29.18 kg/m   Labs: Results for orders placed or performed during the hospital encounter of 08/09/20 (from the past 48 hour(s))  Basic metabolic panel     Status: Abnormal   Collection Time: 08/09/20  7:11 PM  Result Value Ref Range   Sodium 142 135 - 145 mmol/L   Potassium 4.1 3.5 - 5.1 mmol/L   Chloride 107 98 - 111 mmol/L   CO2 22 22 - 32 mmol/L   Glucose, Bld 122 (H) 70 - 99 mg/dL    Comment: Glucose reference range applies only to  samples taken after fasting for at least 8 hours.   BUN 16 6 - 20 mg/dL   Creatinine, Ser 0.91 0.44 - 1.00 mg/dL   Calcium 9.7 8.9 - 10.3 mg/dL   GFR, Estimated >60 >60 mL/min    Comment: (NOTE) Calculated using the CKD-EPI Creatinine Equation (2021)    Anion gap 13 5 - 15    Comment: Performed at [facility], [place] [address], [place], [place] [address]  CBC     Status: Abnormal   Collection Time: 08/09/20  7:11 PM  Result Value Ref Range   WBC 10.7 (H) 4.0 - 10.5 K/uL   RBC 4.81 3.87 - 5.11 MIL/uL   Hemoglobin 14.5 12.0 - 15.0 g/dL   HCT 44.3 36.0 - 46.0 %   MCV 92.1 80.0 - 100.0 fL   MCH 30.1 26.0 - 34.0 pg   MCHC 32.7 30.0 - 36.0 g/dL   RDW 11.9 11.5 - 15.5 %   Platelets 232 150 - 400 K/uL   nRBC 0.0 0.0 - 0.2 %    Comment: Performed at [facility], [place] [address], [place], [place] [address]  Troponin I (High Sensitivity)     Status: None   Collection Time: 08/09/20  7:11 PM  Result Value Ref  Range   Troponin I (High Sensitivity) 4 <18 ng/L    Comment: (NOTE) Elevated high sensitivity troponin I (hsTnI) values and significant  changes across serial measurements may suggest ACS but many other  chronic and acute conditions are known to elevate hsTnI results.  Refer to the "Links" section for chest pain algorithms and additional  guidance. Performed at [facility], [place] [address] [place], [place] [address]   I-Stat beta hCG blood, ED     Status: None   Collection Time: 08/09/20  7:19 PM  Result Value Ref Range   I-stat hCG, quantitative <5.0 <5 mIU/mL   Comment 3            Comment:   GEST. AGE      CONC.  (mIU/mL)   <=1 WEEK        5 - 50     2 WEEKS       50 - 500     3 WEEKS       100 - 10,000     4 WEEKS     1,000 - 30,000        FEMALE AND NON-PREGNANT FEMALE:     LESS THAN 5 mIU/mL   Troponin I (High Sensitivity)     Status: None   Collection Time: 08/09/20  9:36 PM  Result Value Ref Range   Troponin  I (High Sensitivity) 6 <18 ng/L    Comment: (NOTE) Elevated high sensitivity troponin I (hsTnI) values and significant  changes across serial measurements may suggest ACS but many other  chronic and acute conditions are known to elevate hsTnI results.  Refer to the "Links" section for chest pain algorithms and additional  guidance. Performed at [facility], [place] [address], [place], [place] [address]   Hepatic function panel     Status: None   Collection Time: 08/09/20  9:36 PM  Result Value Ref Range   Total Protein 7.3 6.5 - 8.1 g/dL   Albumin 4.6 3.5 - 5.0 g/dL   AST 20 15 - 41 U/L   ALT 22 0 - 44 U/L   Alkaline Phosphatase 52 38 - 126 U/L   Total Bilirubin 1.1 0.3 - 1.2 mg/dL   Bilirubin, Direct 0.2 0.0 - 0.2 mg/dL   Indirect Bilirubin 0.9 0.3 - 0.9 mg/dL    Comment: Performed at [facility], [place] [address], [place], [place] [address]  Lipase, blood     Status: None   Collection Time: 08/09/20  9:36 PM  Result Value Ref Range   Lipase 37 11 - 51 U/L    Comment: Performed at [facility], [place] [address] [place], [place] [address]  Urinalysis, Routine w reflex microscopic Urine, Clean Catch     Status: Abnormal   Collection Time: 08/09/20 10:01 PM  Result Value Ref Range   Color, Urine STRAW (A) YELLOW   APPearance CLEAR CLEAR   Specific Gravity, Urine 1.016 1.005 - 1.030   pH 5.0 5.0 - 8.0   Glucose, UA NEGATIVE NEGATIVE mg/dL   Hgb urine dipstick NEGATIVE NEGATIVE   Bilirubin Urine NEGATIVE NEGATIVE   Ketones, ur 20 (A) NEGATIVE mg/dL   Protein, ur NEGATIVE NEGATIVE mg/dL   Nitrite NEGATIVE NEGATIVE   Leukocytes,Ua NEGATIVE NEGATIVE    Comment: Performed at [place] [address], [place], [place] [address]  Resp Panel by RT-PCR (Flu A&B, Covid) Nasopharyngeal Swab     Status: None  Collection Time: 08/10/20 12:23 AM   Specimen: Nasopharyngeal Swab; Nasopharyngeal(NP) swabs in vial  transport medium  Result Value Ref Range   SARS Coronavirus 2 by RT PCR NEGATIVE NEGATIVE    Comment: (NOTE) SARS-CoV-2 target nucleic acids are NOT DETECTED.  The SARS-CoV-2 RNA is generally detectable in upper respiratory specimens during the acute phase of infection. The lowest concentration of SARS-CoV-2 viral copies this assay can detect is 138 copies/mL. A negative result does not preclude SARS-Cov-2 infection and should not be used as the sole basis for treatment or other patient management decisions. A negative result may occur with  improper specimen collection/handling, submission of specimen other than nasopharyngeal swab, presence of viral mutation(s) within the areas targeted by this assay, and inadequate number of viral copies(<138 copies/mL). A negative result must be combined with clinical observations, patient history, and epidemiological information. The expected result is Negative.  Fact Sheet for Patients:  EntrepreneurPulse.com.au  Fact Sheet for Healthcare Providers:  IncredibleEmployment.be  This test is no t yet approved or cleared by the [place] FDA and  has been authorized for detection and/or diagnosis of SARS-CoV-2 by FDA under an Emergency Use Authorization (EUA). This EUA will remain  in effect (meaning this test can be used) for the duration of the COVID-19 declaration under Section 564(b)(1) of the Act, 21 U.S.C.section 360bbb-3(b)(1), unless the authorization is terminated  or revoked sooner.       Influenza A by PCR NEGATIVE NEGATIVE   Influenza B by PCR NEGATIVE NEGATIVE    Comment: (NOTE) The Xpert Xpress SARS-CoV-2/FLU/RSV plus assay is intended as an aid in the diagnosis of influenza from Nasopharyngeal swab specimens and should not be used as a sole basis for treatment. Nasal washings and aspirates are unacceptable for Xpert Xpress SARS-CoV-2/FLU/RSV testing.  Fact Sheet for  Patients: EntrepreneurPulse.com.au  Fact Sheet for Healthcare Providers: IncredibleEmployment.be  This test is not yet approved or cleared by the [place] FDA and has been authorized for detection and/or diagnosis of SARS-CoV-2 by FDA under an Emergency Use Authorization (EUA). This EUA will remain in effect (meaning this test can be used) for the duration of the COVID-19 declaration under Section 564(b)(1) of the Act, 21 U.S.C. section 360bbb-3(b)(1), unless the authorization is terminated or revoked.  Performed at [facility], [place] [address], [place], [place] [address]   HIV Antibody (routine testing w rflx)     Status: None   Collection Time: 08/10/20  4:40 AM  Result Value Ref Range   HIV Screen 4th Generation wRfx Non Reactive Non Reactive    Comment: Performed at [facility], [place] [address], [place], [place] [address]  Comprehensive metabolic panel     Status: Abnormal   Collection Time: 08/10/20  4:40 AM  Result Value Ref Range   Sodium 142 135 - 145 mmol/L   Potassium 3.5 3.5 - 5.1 mmol/L   Chloride 110 98 - 111 mmol/L   CO2 21 (L) 22 - 32 mmol/L   Glucose, Bld 148 (H) 70 - 99 mg/dL    Comment: Glucose reference range applies only to samples taken after fasting for at least 8 hours.   BUN 12 6 - 20 mg/dL   Creatinine, Ser 0.84 0.44 - 1.00 mg/dL   Calcium 8.8 (L) 8.9 - 10.3 mg/dL   Total Protein 6.6 6.5 - 8.1 g/dL   Albumin 4.0 3.5 - 5.0 g/dL   AST 27 15 - 41 U/L   ALT 25 0 -  44 U/L   Alkaline Phosphatase 48 38 - 126 U/L   Total Bilirubin 1.3 (H) 0.3 - 1.2 mg/dL   GFR, Estimated >60 >60 mL/min    Comment: (NOTE) Calculated using the CKD-EPI Creatinine Equation (2021)    Anion gap 11 5 - 15    Comment: Performed at [facility], [place] [address], [place], [place] [address]  CBC     Status: Abnormal   Collection Time: 08/10/20  4:40 AM  Result Value Ref Range   WBC 11.8 (H) 4.0 -  10.5 K/uL   RBC 4.39 3.87 - 5.11 MIL/uL   Hemoglobin 13.4 12.0 - 15.0 g/dL   HCT 40.5 36.0 - 46.0 %   MCV 92.3 80.0 - 100.0 fL   MCH 30.5 26.0 - 34.0 pg   MCHC 33.1 30.0 - 36.0 g/dL   RDW 11.9 11.5 - 15.5 %   Platelets 195 150 - 400 K/uL   nRBC 0.0 0.0 - 0.2 %    Comment: Performed at [facility], [place] [address], [place], [place] [address]    Imaging / Studies: DG Chest 2 View  Result Date: 08/09/2020 CLINICAL DATA:  Chest pain EXAM: CHEST - 2 VIEW COMPARISON:  05/02/2006 report FINDINGS: The heart size and mediastinal contours are within normal limits. Both lungs are clear. The visualized skeletal structures are unremarkable. IMPRESSION: No active cardiopulmonary disease. Electronically Signed   By: Donavan Foil M.D.   On: 08/09/2020 19:40   CT ABDOMEN PELVIS W CONTRAST  Result Date: 08/09/2020 CLINICAL DATA:  Chest pain and mid upper abdominal pain, pain radiating to lower left abdomen EXAM: CT ABDOMEN AND PELVIS WITH CONTRAST TECHNIQUE: Multidetector CT imaging of the abdomen and pelvis was performed using the standard protocol following bolus administration of intravenous contrast. CONTRAST:  165mL OMNIPAQUE IOHEXOL 300 MG/ML  SOLN COMPARISON:  None. FINDINGS: Lower chest: No acute pleural or parenchymal lung disease. Hepatobiliary: Gallbladder is distended, with diffuse gallbladder wall thickening and trace pericholecystic fluid. No calcified gallstones. The appearance is suspicious for acute cholecystitis. Liver enhances normally.  No biliary dilation. Pancreas: Unremarkable. No pancreatic ductal dilatation or surrounding inflammatory changes. Spleen: Normal in size without focal abnormality. Adrenals/Urinary Tract: The kidneys enhance normally and symmetrically. No urinary tract calculi or obstructive uropathy. The adrenals and bladder are unremarkable. Stomach/Bowel: No bowel obstruction or ileus. There is a normal retrocecal appendix. No bowel wall thickening or  inflammatory change. Vascular/Lymphatic: No significant vascular findings are present. No enlarged abdominal or pelvic lymph nodes. Reproductive: Uterus and bilateral adnexa are unremarkable. Other: Trace pericholecystic fluid as above. No free intraperitoneal gas. No abdominal wall hernia. Musculoskeletal: No acute or destructive bony lesions. Reconstructed images demonstrate no additional findings. IMPRESSION: 1. Distended gallbladder, with gallbladder wall thickening and trace pericholecystic fluid suspicious for acute cholecystitis. No calcified gallstones. If further evaluation is desired, right upper quadrant ultrasound or nuclear medicine hepatobiliary scan could be considered. 2. Otherwise unremarkable CT of the abdomen and pelvis. Electronically Signed   By: Randa Ngo M.D.   On: 08/09/2020 22:00   US Abdomen Limited RUQ (LIVER/GB)  Result Date: 08/09/2020 CLINICAL DATA:  Right upper quadrant pain. EXAM: ULTRASOUND ABDOMEN LIMITED RIGHT UPPER QUADRANT COMPARISON:  Abdominopelvic CT earlier today. FINDINGS: Gallbladder: Physiologically distended. Multiple intraluminal gallstones. There is diffuse gallbladder wall thickening up to 8 mm. Small amount of pericholecystic fluid. No sonographic Murphy sign noted by sonographer. Common bile duct: Diameter: 5 mm.  No evidence of choledocholithiasis. Liver: Tiny cyst  in the left lobe on prior CT not well seen by ultrasound. Within normal limits in parenchymal echogenicity. Portal vein is patent on color Doppler imaging with normal direction of blood flow towards the liver. Other: None. IMPRESSION: 1. Sonographic findings suspicious for acute cholecystitis. Gallstones with diffuse gallbladder wall thickening and trace pericholecystic fluid. 2. No biliary dilatation Electronically Signed   By: Keith Rake M.D.   On: 08/09/2020 23:45     .Adin Hector, M.D., F.A.C.S. Gastrointestinal and Minimally Invasive Surgery [facility] [address], [place], [place] [address] [phone] Main / Paging  08/10/2020 2:20 PM     Adin Hector

## 2020-08-11 ENCOUNTER — Encounter (HOSPITAL_COMMUNITY): Payer: Self-pay | Admitting: Surgery

## 2020-08-11 MED ORDER — AMOXICILLIN-POT CLAVULANATE 875-125 MG PO TABS: 1.0000 | ORAL_TABLET | Freq: Two times a day (BID) | ORAL | 0 refills | Status: AC

## 2020-08-11 MED ORDER — SODIUM CHLORIDE 0.9 % IV SOLN: 250.0000 mL | INTRAVENOUS | Status: DC | PRN

## 2020-08-11 MED ORDER — TRAMADOL HCL 50 MG PO TABS: 50.0000 mg | ORAL_TABLET | Freq: Four times a day (QID) | ORAL | 0 refills | Status: DC | PRN

## 2020-08-11 MED ORDER — SODIUM CHLORIDE 0.9% FLUSH: 3.0000 mL | INTRAVENOUS | Status: DC | PRN

## 2020-08-11 MED ORDER — ACETAMINOPHEN 325 MG PO TABS: 650.0000 mg | ORAL_TABLET | Freq: Four times a day (QID) | ORAL | Status: DC | PRN

## 2020-08-11 MED ORDER — SODIUM CHLORIDE 0.9% FLUSH
3.0000 mL | Freq: Two times a day (BID) | INTRAVENOUS | Status: DC
  Administered 2020-08-11: 3 mL via INTRAVENOUS

## 2020-08-11 MED ORDER — LACTATED RINGERS IV BOLUS: 1000.0000 mL | Freq: Three times a day (TID) | INTRAVENOUS | Status: DC | PRN

## 2020-08-11 NOTE — Discharge Summary (Signed)
[facility] Discharge Summary   Patient ID: Elizabeth [name] MRN: [id number] DOB/AGE: 07-08-1960 60 y.o.  Admit date: 08/09/2020 Discharge date: 08/11/2020  Admitting Diagnosis: Acute cholecystitis  Discharge Diagnosis Patient Active Problem List   Diagnosis Date Noted  . Acute on chronic cholecystitis s/p lap cholecystectomy 08/10/2020 08/09/2020  . RLS (restless legs syndrome) 05/15/2011  . UNSPECIFIED VITAMIN D DEFICIENCY 04/04/2010  . PARESTHESIA 12/13/2009  . DEPRESSION 08/17/2008  . INSOMNIA 08/17/2008  . ALLERGIC RHINITIS 09/16/2007  . IMPAIRED FASTING GLUCOSE 09/16/2007  . Anxiety state 07/15/2007    Consultants None   Imaging: DG Chest 2 View  Result Date: 08/09/2020 CLINICAL DATA:  Chest pain EXAM: CHEST - 2 VIEW COMPARISON:  05/02/2006 report FINDINGS: The heart size and mediastinal contours are within normal limits. Both lungs are clear. The visualized skeletal structures are unremarkable. IMPRESSION: No active cardiopulmonary disease. Electronically Signed   By: Donavan Foil M.D.   On: 08/09/2020 19:40   CT ABDOMEN PELVIS W CONTRAST  Result Date: 08/09/2020 CLINICAL DATA:  Chest pain and mid upper abdominal pain, pain radiating to lower left abdomen EXAM: CT ABDOMEN AND PELVIS WITH CONTRAST TECHNIQUE: Multidetector CT imaging of the abdomen and pelvis was performed using the standard protocol following bolus administration of intravenous contrast. CONTRAST:  164mL OMNIPAQUE IOHEXOL 300 MG/ML  SOLN COMPARISON:  None. FINDINGS: Lower chest: No acute pleural or parenchymal lung disease. Hepatobiliary: Gallbladder is distended, with diffuse gallbladder wall thickening and trace pericholecystic fluid. No calcified gallstones. The appearance is suspicious for acute cholecystitis. Liver enhances normally.  No biliary dilation. Pancreas: Unremarkable. No pancreatic ductal dilatation or surrounding inflammatory changes. Spleen: Normal in size without focal  abnormality. Adrenals/Urinary Tract: The kidneys enhance normally and symmetrically. No urinary tract calculi or obstructive uropathy. The adrenals and bladder are unremarkable. Stomach/Bowel: No bowel obstruction or ileus. There is a normal retrocecal appendix. No bowel wall thickening or inflammatory change. Vascular/Lymphatic: No significant vascular findings are present. No enlarged abdominal or pelvic lymph nodes. Reproductive: Uterus and bilateral adnexa are unremarkable. Other: Trace pericholecystic fluid as above. No free intraperitoneal gas. No abdominal wall hernia. Musculoskeletal: No acute or destructive bony lesions. Reconstructed images demonstrate no additional findings. IMPRESSION: 1. Distended gallbladder, with gallbladder wall thickening and trace pericholecystic fluid suspicious for acute cholecystitis. No calcified gallstones. If further evaluation is desired, right upper quadrant ultrasound or nuclear medicine hepatobiliary scan could be considered. 2. Otherwise unremarkable CT of the abdomen and pelvis. Electronically Signed   By: Randa Ngo M.D.   On: 08/09/2020 22:00   US Abdomen Limited RUQ (LIVER/GB)  Result Date: 08/09/2020 CLINICAL DATA:  Right upper quadrant pain. EXAM: ULTRASOUND ABDOMEN LIMITED RIGHT UPPER QUADRANT COMPARISON:  Abdominopelvic CT earlier today. FINDINGS: Gallbladder: Physiologically distended. Multiple intraluminal gallstones. There is diffuse gallbladder wall thickening up to 8 mm. Small amount of pericholecystic fluid. No sonographic Murphy sign noted by sonographer. Common bile duct: Diameter: 5 mm.  No evidence of choledocholithiasis. Liver: Tiny cyst in the left lobe on prior CT not well seen by ultrasound. Within normal limits in parenchymal echogenicity. Portal vein is patent on color Doppler imaging with normal direction of blood flow towards the liver. Other: None. IMPRESSION: 1. Sonographic findings suspicious for acute cholecystitis. Gallstones with  diffuse gallbladder wall thickening and trace pericholecystic fluid. 2. No biliary dilatation Electronically Signed   By: Keith Rake M.D.   On: 08/09/2020 23:45    Procedures Dr. Michael Boston (08/10/20) - Laparoscopic Cholecystectomy with [facility]  Course:  Patient is a 60 year old female who presented to [facility] with abdominal pain.  Workup showed acute cholecystitis.  Patient was admitted and underwent procedure listed above.  Tolerated procedure well and was transferred to the floor.  Diet was advanced as tolerated.  On POD#1, the patient was voiding well, tolerating diet, ambulating well, pain well controlled, vital signs stable, incisions c/d/i and felt stable for discharge home.  Patient will follow up in our office in 2 weeks and knows to call with questions or concerns.  She will call to confirm appointment date/time.    Physical Exam: General:  Alert, NAD, sitting in chair, jovial Abd:  Soft, ND, mild tenderness, dressings C/D/I   I or a member of my team have reviewed this patient in the Controlled Substance Database.   Allergies as of 08/11/2020      Reactions   Dextromethorphan-guaifenesin    REACTION: hives   Fluoxetine Hcl    REACTION: skin reaction   Wellbutrin [bupropion] Rash   From head to toe      Medication List    TAKE these medications   acetaminophen 325 MG tablet Commonly known as: TYLENOL Take 2 tablets (650 mg total) by mouth every 6 (six) hours as needed for mild pain or fever.   amoxicillin-clavulanate 875-125 MG tablet Commonly known as: Augmentin Take 1 tablet by mouth every 12 (twelve) hours for 5 days.   aspirin 81 MG tablet Take 81 mg by mouth daily.   busPIRone 10 MG tablet Commonly known as: BUSPAR TAKE TWO TABLETS BY MOUTH TWICE A DAY What changed: how much to take   CAFFEINE PO Take 50 mg by mouth daily.   Co Q 10 100 MG Caps Take 100 mg by mouth daily.   ferrous sulfate 325 (65 FE) MG tablet Take 325 mg by mouth See admin  instructions. Tuesday, Thursday, Saturday and sunday   gabapentin 800 MG tablet Commonly known as: NEURONTIN TAKE ONE TABLET BY MOUTH AT BEDTIME   Glucosamine 750 MG Tabs Take 1 tablet by mouth 2 (two) times daily.   loratadine 10 MG tablet Commonly known as: CLARITIN Take 10 mg by mouth daily.   multivitamin tablet Take 1 tablet by mouth daily.   norethindrone-ethinyl estradiol 1-5 MG-MCG Tabs tablet Commonly known as: FEMHRT 1/5 Take 1 tablet by mouth daily.   traMADol 50 MG tablet Commonly known as: ULTRAM Take 1 tablet (50 mg total) by mouth every 6 (six) hours as needed for moderate pain.   vitamin C 1000 MG tablet Take 1 tablet by mouth daily.   VITAMIN D-3 PO Take 2,000 Units by mouth daily.   vitamin E 180 MG (400 UNITS) capsule Take 400 Units by mouth daily.         Follow-up Information    [facility], [place] Follow up on 08/24/2020.   Specialty: General Surgery Why: 3:30 pm, arrive by 3:00pm for paperwork and check in process Contact information: [address] [address] [phone]               Signed: Norm Parcel , [facility] 08/11/2020, 11:08 AM Please see Amion for pager number during day hours 7:00am-4:30pm

## 2020-08-14 LAB — SURGICAL PATHOLOGY

## 2020-08-16 ENCOUNTER — Ambulatory Visit: Payer: 59 | Admitting: Professional

## 2020-08-21 ENCOUNTER — Ambulatory Visit (INDEPENDENT_AMBULATORY_CARE_PROVIDER_SITE_OTHER): Payer: 59 | Admitting: Professional

## 2020-08-21 DIAGNOSIS — F331 Major depressive disorder, recurrent, moderate: Secondary | ICD-10-CM

## 2020-08-21 DIAGNOSIS — F411 Generalized anxiety disorder: Secondary | ICD-10-CM

## 2020-08-23 ENCOUNTER — Ambulatory Visit (INDEPENDENT_AMBULATORY_CARE_PROVIDER_SITE_OTHER): Payer: 59 | Admitting: Professional

## 2020-08-23 DIAGNOSIS — F411 Generalized anxiety disorder: Secondary | ICD-10-CM | POA: Diagnosis not present

## 2020-08-23 DIAGNOSIS — F331 Major depressive disorder, recurrent, moderate: Secondary | ICD-10-CM | POA: Diagnosis not present

## 2020-09-20 ENCOUNTER — Ambulatory Visit (INDEPENDENT_AMBULATORY_CARE_PROVIDER_SITE_OTHER): Payer: 59 | Admitting: Professional

## 2020-09-20 DIAGNOSIS — F331 Major depressive disorder, recurrent, moderate: Secondary | ICD-10-CM | POA: Diagnosis not present

## 2020-09-20 DIAGNOSIS — F411 Generalized anxiety disorder: Secondary | ICD-10-CM | POA: Diagnosis not present

## 2020-10-11 ENCOUNTER — Ambulatory Visit (INDEPENDENT_AMBULATORY_CARE_PROVIDER_SITE_OTHER): Payer: 59 | Admitting: Professional

## 2020-10-11 ENCOUNTER — Other Ambulatory Visit: Payer: Self-pay | Admitting: Family Medicine

## 2020-10-11 DIAGNOSIS — F331 Major depressive disorder, recurrent, moderate: Secondary | ICD-10-CM | POA: Diagnosis not present

## 2020-10-11 DIAGNOSIS — G2581 Restless legs syndrome: Secondary | ICD-10-CM

## 2020-10-11 DIAGNOSIS — F411 Generalized anxiety disorder: Secondary | ICD-10-CM

## 2020-10-12 ENCOUNTER — Other Ambulatory Visit: Payer: Self-pay | Admitting: Student

## 2020-10-12 ENCOUNTER — Other Ambulatory Visit: Payer: Self-pay

## 2020-10-12 ENCOUNTER — Ambulatory Visit (HOSPITAL_BASED_OUTPATIENT_CLINIC_OR_DEPARTMENT_OTHER)
Admission: RE | Admit: 2020-10-12 | Discharge: 2020-10-12 | Disposition: A | Discharge status: Home / Self Care | Payer: 59 | Source: Ambulatory Visit | Attending: Student | Admitting: Student

## 2020-10-12 ENCOUNTER — Other Ambulatory Visit (HOSPITAL_COMMUNITY): Payer: Self-pay | Admitting: Student

## 2020-10-12 DIAGNOSIS — R109 Unspecified abdominal pain: Secondary | ICD-10-CM | POA: Insufficient documentation

## 2020-10-25 ENCOUNTER — Ambulatory Visit (INDEPENDENT_AMBULATORY_CARE_PROVIDER_SITE_OTHER): Payer: 59 | Admitting: Professional

## 2020-10-25 DIAGNOSIS — F331 Major depressive disorder, recurrent, moderate: Secondary | ICD-10-CM

## 2020-10-25 DIAGNOSIS — F411 Generalized anxiety disorder: Secondary | ICD-10-CM

## 2020-10-26 ENCOUNTER — Other Ambulatory Visit: Payer: Self-pay

## 2020-10-26 ENCOUNTER — Ambulatory Visit (INDEPENDENT_AMBULATORY_CARE_PROVIDER_SITE_OTHER): Payer: 59 | Admitting: Behavioral Health

## 2020-10-26 ENCOUNTER — Encounter: Payer: Self-pay | Admitting: Behavioral Health

## 2020-10-26 VITALS — BP 130/86 | HR 73 | Ht 64.0 in | Wt 176.0 lb

## 2020-10-26 DIAGNOSIS — F331 Major depressive disorder, recurrent, moderate: Secondary | ICD-10-CM

## 2020-10-26 DIAGNOSIS — F411 Generalized anxiety disorder: Secondary | ICD-10-CM | POA: Diagnosis not present

## 2020-10-26 MED ORDER — SERTRALINE HCL 50 MG PO TABS
ORAL_TABLET | ORAL | 1 refills | Status: DC
Start: 2020-10-26 — End: 2020-11-22

## 2020-10-26 MED ORDER — BUSPIRONE HCL 15 MG PO TABS
15.0000 mg | ORAL_TABLET | Freq: Two times a day (BID) | ORAL | 2 refills | Status: AC
Start: 2020-10-26 — End: 2020-11-10

## 2020-10-26 NOTE — Progress Notes (Addendum)
[facility] MD/PA/NP Initial Note  10/26/2020 9:28 AM JASPER RUMINSKI  MRN:  [id number]  Chief Complaint:   HPI: 60 year old patient presents to this office for initial visit and to establish care. She says, " just here to have someone manage my medications". Says her primary care recommended a specialist to help her with her depression and anxiety medications. Says she is currently receiving therapy from [name] at [facility]. Says that she has been off of an SSRI since 2013 and has only been taking Buspar for anxiety, but it's to the point its not working anymore. She says she has only been taking 10 mg twice daily. She reports her anxiety level today at 6 and depression 6. She reports about 5 hours total sleep per night but describes it as broken. She says that she does a good job of not allowing the depressive symptoms to not show because she is used to doing it with her job. 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 She believes her depression and and anxiety is at a moderate level. She said she does have some suicidal or ideation about not being alive but said she does not have plan and would not follow through for the sake of family. No mania or psychosis. No auditory or visual hallucinations. SI without plan. No HI.   Past Psychiatric medication failures: Prozac Paxil  Cymbalta Effexor Wellbutrin   Visit Diagnosis:    ICD-10-CM   1. Generalized anxiety disorder  F41.1 busPIRone (BUSPAR) 15 MG tablet    sertraline (ZOLOFT) 50 MG tablet  2. Major depressive disorder, recurrent episode, moderate (HCC)  F33.1 busPIRone (BUSPAR) 15 MG tablet    sertraline (ZOLOFT) 50 MG tablet    Past Psychiatric History: Long history of depression since 1998.  Current Therapy with [name]  Past Medical History:  Past Medical History:  Diagnosis Date  . Allergy   . Anxiety   . Depression   . Fibroid, uterine   . Neuromuscular  disorder ([place])    RLS    Past Surgical History:  Procedure Laterality Date  . DILATION AND CURETTAGE OF UTERUS     x 2 for bleeding  . INGUINAL HERNIA REPAIR     bilat at 64month.   Marland Kitchen LAPAROSCOPIC CHOLECYSTECTOMY SINGLE SITE WITH INTRAOPERATIVE CHOLANGIOGRAM N/A 08/10/2020   Procedure: LAPAROSCOPIC CHOLECYSTECTOMY SINGLE SITE;  Surgeon: Michael Boston, MD;  Location: [facility];  Service: General;  Laterality: N/A;  . LASER ABLATION OF THE CERVIX    . TEAR DUCT PROBING     age 36   . TONSILLECTOMY AND ADENOIDECTOMY     age 61     Family Psychiatric History: see chart  Family History:  Family History  Problem Relation Age of Onset  . Hypertension Father   . Heart disease Father   . Arthritis Father   . Diabetes Father   . Anxiety disorder Father   . Heart disease Mother        Septal defect   . Anxiety disorder Mother   . Cancer Brother        leiiomyoscarcoma  . Anxiety disorder Brother   . Hypertension Brother   . Cancer Brother        giant cell tumor.   . Kidney Stones Brother   . Anxiety disorder Sister   . Colon cancer Neg Hx     Social History:  Social History  Socioeconomic History  . Marital status: Divorced    Spouse name: Not on file  . Number of children: 0  . Years of education: Not on file  . Highest education level: Not on file  Occupational History  . Occupation: VETERINARIAN    Employer: [employer]  Tobacco Use  . Smoking status: Never Smoker  . Smokeless tobacco: Never Used  Vaping Use  . Vaping Use: Never used  Substance and Sexual Activity  . Alcohol use: Yes    Comment: Rarely.   . Drug use: No  . Sexual activity: Not Currently  Other Topics Concern  . Not on file  Social History Narrative   Lives alone with three cats.   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    Social Determinants of Health   Financial Resource Strain: Not on file  Food Insecurity: Not on file  Transportation Needs: Not on  file  Physical Activity: Not on file  Stress: Not on file  Social Connections: Not on file    Allergies:  Allergies  Allergen Reactions  . Dextromethorphan-Guaifenesin     REACTION: hives  . Fluoxetine Hcl     REACTION: skin reaction  . Wellbutrin [Bupropion] Rash    From head to toe    Metabolic Disorder Labs: Lab Results  Component Value Date   HGBA1C 5.0 12/08/2019   MPG 97 12/08/2019   MPG 100 11/28/2016   No results found for: PROLACTIN Lab Results  Component Value Date   CHOL 177 12/08/2019   TRIG 64 12/08/2019   HDL 56 12/08/2019   CHOLHDL 3.2 12/08/2019   VLDL 11 12/14/2015   LDLCALC 106 (H) 12/08/2019   LDLCALC 100 (H) 12/02/2018   Lab Results  Component Value Date   TSH 1.22 12/08/2019   TSH 1.30 12/02/2018    Therapeutic Level Labs: No results found for: LITHIUM No results found for: VALPROATE No components found for:  CBMZ  Current Medications: Current Outpatient Medications  Medication Sig Dispense Refill  . Ascorbic Acid (VITAMIN C) 1000 MG tablet Take 1 tablet by mouth daily.    . busPIRone (BUSPAR) 10 MG tablet TAKE TWO TABLETS BY MOUTH TWICE A DAY (Patient taking differently: Take 10 mg by mouth 2 (two) times daily.) 360 tablet 0  . busPIRone (BUSPAR) 15 MG tablet Take 1 tablet (15 mg total) by mouth 2 (two) times daily for 30 doses. 60 tablet 2  . CAFFEINE PO Take 50 mg by mouth daily.    . Cholecalciferol (VITAMIN D-3 PO) Take 2,000 Units by mouth daily.    . Coenzyme Q10 (CO Q 10) 100 MG CAPS Take 100 mg by mouth daily.    . ferrous sulfate 325 (65 FE) MG tablet Take 325 mg by mouth See admin instructions. Tuesday, Thursday, Saturday and sunday    . gabapentin (NEURONTIN) 800 MG tablet TAKE ONE TABLET BY MOUTH AT BEDTIME 90 tablet 1  . Glucosamine 750 MG TABS Take 1 tablet by mouth 2 (two) times daily.    Marland Kitchen loratadine (CLARITIN) 10 MG tablet Take 10 mg by mouth daily.    . Multiple Vitamin (MULTIVITAMIN) tablet Take 1 tablet by mouth  daily.    . norethindrone-ethinyl estradiol (FEMHRT 1/5) 1-5 MG-MCG TABS tablet Take 1 tablet by mouth daily.    . sertraline (ZOLOFT) 50 MG tablet One half tablet 25 mg for 7 days. Then one whole tablet. 30 tablet 1  . vitamin E 180 MG (400  UNITS) capsule Take 400 Units by mouth daily.    Marland Kitchen acetaminophen (TYLENOL) 325 MG tablet Take 2 tablets (650 mg total) by mouth every 6 (six) hours as needed for mild pain or fever. (Patient not taking: Reported on 10/26/2020)    . aspirin 81 MG tablet Take 81 mg by mouth daily. (Patient not taking: Reported on 10/26/2020)    . traMADol (ULTRAM) 50 MG tablet Take 1 tablet (50 mg total) by mouth every 6 (six) hours as needed for moderate pain. (Patient not taking: Reported on 10/26/2020) 15 tablet 0   No current facility-administered medications for this visit.    Medication Side Effects:Prozac (Rash)  Buspar (Head to toe Rash)  Orders placed this visit:  No orders of the defined types were placed in this encounter.   Psychiatric Specialty Exam:  Review of Systems  Gastrointestinal: Positive for abdominal pain.    Blood pressure 130/86, pulse 73, height 5\' 4"  (1.626 m), weight 176 lb (79.8 kg).Body mass index is 30.21 kg/m.  General Appearance: Casual and Neat  Eye Contact:  Good  Speech:  Clear and Coherent  Volume:  Normal  Mood:  Depressed  Affect:  Non-Congruent  Thought Process:  Coherent  Orientation:  Full (Time, Place, and Person)  Thought Content: Logical   Suicidal Thoughts:  Yes.  without intent/plan  Homicidal Thoughts:  No  Memory:  WNL  Judgement:  Good  Insight:  Good  Psychomotor Activity:  Normal  Concentration:  Concentration: Good  Recall:  Good  Fund of Knowledge: Good  Language: Good  Assets:  Desire for Improvement  ADL's:  Intact  Cognition: WNL  Prognosis:  Good   Screenings:  GAD-7   Flowsheet Row Office Visit from 10/26/2020 in [place] Office Visit from 12/08/2019 in [facility]  [place] Visit from 12/02/2018 in [place] Telephone from 08/20/2018 in [place] Visit from 11/26/2017 in [place]  Total GAD-7 Score 4 1 2 3 2     PHQ2-9   [place] Office Visit from 10/26/2020 in [facility] [facility] Visit from 12/08/2019 in [place] Office Visit from 12/02/2018 in [place] Telephone from 08/20/2018 in [place] Office Visit from 11/26/2017 in [place]  PHQ-2 Total Score 4 1 2 1 1   PHQ-9 Total Score 14 4 6 2  --    Flowsheet Row ED to Hosp-Admission (Discharged) from 08/09/2020 in [place] No Risk      Receiving Psychotherapy:yes, [name]  Treatment Plan/Recommendations:   To start Zoloft one half tab 25 mg for 7 days, the one whole tablet Increase buspar to 15 mg twice daily Will report side affects or worsening symptoms promptly Will follow up in 4 weeks to reassess Greater than 50% of  60 min face to face time with patient was spent on counseling and coordination of care. We discussed history of depression in detail, past medication failures and side effects. Patient agreed to DC zoloft if rash develops. She will continue psychotherapy. Emergency contact and after hours information provided. Patient verbally contracted for safety.    [name], NP

## 2020-11-08 ENCOUNTER — Ambulatory Visit (INDEPENDENT_AMBULATORY_CARE_PROVIDER_SITE_OTHER): Payer: 59 | Admitting: Professional

## 2020-11-08 DIAGNOSIS — F331 Major depressive disorder, recurrent, moderate: Secondary | ICD-10-CM

## 2020-11-08 DIAGNOSIS — F411 Generalized anxiety disorder: Secondary | ICD-10-CM

## 2020-11-22 ENCOUNTER — Encounter: Payer: Self-pay | Admitting: Behavioral Health

## 2020-11-22 ENCOUNTER — Ambulatory Visit (INDEPENDENT_AMBULATORY_CARE_PROVIDER_SITE_OTHER): Payer: 59 | Admitting: Professional

## 2020-11-22 ENCOUNTER — Other Ambulatory Visit: Payer: Self-pay

## 2020-11-22 ENCOUNTER — Ambulatory Visit (INDEPENDENT_AMBULATORY_CARE_PROVIDER_SITE_OTHER): Payer: 59 | Admitting: Behavioral Health

## 2020-11-22 DIAGNOSIS — F411 Generalized anxiety disorder: Secondary | ICD-10-CM

## 2020-11-22 DIAGNOSIS — F331 Major depressive disorder, recurrent, moderate: Secondary | ICD-10-CM

## 2020-11-22 DIAGNOSIS — F410 Panic disorder [episodic paroxysmal anxiety] without agoraphobia: Secondary | ICD-10-CM | POA: Diagnosis not present

## 2020-11-22 MED ORDER — SERTRALINE HCL 50 MG PO TABS: ORAL_TABLET | ORAL | 3 refills | Status: DC

## 2020-11-22 MED ORDER — ALPRAZOLAM 0.25 MG PO TABS: 0.2500 mg | ORAL_TABLET | Freq: Every evening | ORAL | 0 refills | Status: DC | PRN

## 2020-11-22 MED ORDER — BUSPIRONE HCL 15 MG PO TABS
15.0000 mg | ORAL_TABLET | Freq: Two times a day (BID) | ORAL | 2 refills | Status: AC
Start: 2020-11-22 — End: 2020-12-22

## 2020-11-22 NOTE — Progress Notes (Signed)
[facility] Med Check  Patient ID: Elizabeth [name],  MRN: [id number]  PCP: Hali Marry, MD  Date of Evaluation: 11/22/2020 Time spent:30 minutes  Chief Complaint:  Chief Complaint   Anxiety; Depression; Panic Attack     HISTORY/CURRENT STATUS: HPI 60 year old patient presents to this office for follow up and medication management. She is smiling and joking. She says that her anxiety and depression has improved significantly but would like to consider a small dosage increase with the Zoloft.  She still has about two panic attacks per month on average but believes that aspect is improving with medication. She reports anxiety today at 3 and depression 4. She is sleeping about 5-6 hours per night. No mania, no psychosis. Does not report SI or HI.   Past Psychiatric medication failures: Prozac Paxil Cymbalta Effexor Wellbutrin  Individual Medical History/ Review of Systems: Changes? :No   Allergies: Dextromethorphan-guaifenesin, Fluoxetine hcl, and Wellbutrin [bupropion]  Current Medications:  Current Outpatient Medications:    ALPRAZolam (XANAX) 0.25 MG tablet, Take 1 tablet (0.25 mg total) by mouth at bedtime as needed for anxiety., Disp: 30 tablet, Rfl: 0   busPIRone (BUSPAR) 15 MG tablet, Take 1 tablet (15 mg total) by mouth 2 (two) times daily., Disp: 60 tablet, Rfl: 2   acetaminophen (TYLENOL) 325 MG tablet, Take 2 tablets (650 mg total) by mouth every 6 (six) hours as needed for mild pain or fever. (Patient not taking: Reported on 10/26/2020), Disp: , Rfl:    Ascorbic Acid (VITAMIN C) 1000 MG tablet, Take 1 tablet by mouth daily., Disp: , Rfl:    aspirin 81 MG tablet, Take 81 mg by mouth daily. (Patient not taking: Reported on 10/26/2020), Disp: , Rfl:    CAFFEINE PO, Take 50 mg by mouth daily., Disp: , Rfl:    Cholecalciferol (VITAMIN D-3 PO), Take 2,000 Units by mouth daily., Disp: , Rfl:    Coenzyme Q10 (CO Q 10) 100 MG CAPS, Take 100 mg by mouth daily., Disp: ,  Rfl:    ferrous sulfate 325 (65 FE) MG tablet, Take 325 mg by mouth See admin instructions. Tuesday, Thursday, Saturday and sunday, Disp: , Rfl:    gabapentin (NEURONTIN) 800 MG tablet, TAKE ONE TABLET BY MOUTH AT BEDTIME, Disp: 90 tablet, Rfl: 1   Glucosamine 750 MG TABS, Take 1 tablet by mouth 2 (two) times daily., Disp: , Rfl:    loratadine (CLARITIN) 10 MG tablet, Take 10 mg by mouth daily., Disp: , Rfl:    Multiple Vitamin (MULTIVITAMIN) tablet, Take 1 tablet by mouth daily., Disp: , Rfl:    norethindrone-ethinyl estradiol (FEMHRT 1/5) 1-5 MG-MCG TABS tablet, Take 1 tablet by mouth daily., Disp: , Rfl:    sertraline (ZOLOFT) 50 MG tablet, Take 1.5 tablets 75 mg  daily., Disp: 45 tablet, Rfl: 3   traMADol (ULTRAM) 50 MG tablet, Take 1 tablet (50 mg total) by mouth every 6 (six) hours as needed for moderate pain. (Patient not taking: Reported on 10/26/2020), Disp: 15 tablet, Rfl: 0   vitamin E 180 MG (400 UNITS) capsule, Take 400 Units by mouth daily., Disp: , Rfl:  Medication Side Effects: none  Family Medical/ Social History: Changes? No  MENTAL HEALTH EXAM:  There were no vitals taken for this visit.There is no height or weight on file to calculate BMI.  General Appearance: Casual and Neat  Eye Contact:  Good  Speech:  Clear and Coherent  Volume:  Normal  Mood:  NA  Affect:  Appropriate  Thought Process:  Coherent  Orientation:  Full (Time, Place, and Person)  Thought Content: Logical   Suicidal Thoughts:  No  Homicidal Thoughts:  No  Memory:  WNL  Judgement:  Good  Insight:  Good  Psychomotor Activity:  Normal  Concentration:  Concentration: Good  Recall:  Good  Fund of Knowledge: Good  Language: Good  Assets:  Desire for Improvement  ADL's:  Intact  Cognition: WNL  Prognosis:  Good    DIAGNOSES:    ICD-10-CM   1. Generalized anxiety disorder  F41.1 busPIRone (BUSPAR) 15 MG tablet    ALPRAZolam (XANAX) 0.25 MG tablet    sertraline (ZOLOFT) 50 MG tablet    2. Major  depressive disorder, recurrent episode, moderate (HCC)  F33.1 busPIRone (BUSPAR) 15 MG tablet    sertraline (ZOLOFT) 50 MG tablet    3. Panic  F41.0 ALPRAZolam (XANAX) 0.25 MG tablet      Receiving Psychotherapy: Yes [name]   RECOMMENDATIONS:  Increase Zoloft to 75 mg daily Increase buspar to 15 mg twice daily Will report side affects or worsening symptoms promptly Will follow up in 2 months to reassess Greater than 50% of 30 min face to face time with patient was spent on counseling and coordination of care. Patient agreed to DC zoloft if rash develops. She will continue psychotherapy. Emergency contact and after hours information provided. Patient verbally contracted for safety. Refills escribed to patient pharmacy.    [name], NP

## 2020-12-06 ENCOUNTER — Ambulatory Visit (INDEPENDENT_AMBULATORY_CARE_PROVIDER_SITE_OTHER): Payer: 59 | Admitting: Professional

## 2020-12-06 DIAGNOSIS — F331 Major depressive disorder, recurrent, moderate: Secondary | ICD-10-CM

## 2020-12-06 DIAGNOSIS — F411 Generalized anxiety disorder: Secondary | ICD-10-CM

## 2020-12-13 ENCOUNTER — Ambulatory Visit (INDEPENDENT_AMBULATORY_CARE_PROVIDER_SITE_OTHER): Payer: 59 | Admitting: Family Medicine

## 2020-12-13 ENCOUNTER — Other Ambulatory Visit: Payer: Self-pay

## 2020-12-13 ENCOUNTER — Encounter: Payer: Self-pay | Admitting: Family Medicine

## 2020-12-13 VITALS — BP 138/78 | HR 64 | Ht 64.0 in | Wt 174.0 lb

## 2020-12-13 DIAGNOSIS — Z0000 Encounter for general adult medical examination without abnormal findings: Secondary | ICD-10-CM

## 2020-12-13 NOTE — Progress Notes (Addendum)
Subjective:     Elizabeth [name] is a 60 y.o. female and is here for a comprehensive physical exam. The patient reports problems - Mood .  She reports that she was really struggling emotionally over the last several months she was getting to the point where she really did not want to be here she started seeing [name] for therapy and has been having her medications adjusted with Lesle Chris, nurse practitioner.  She is now on 75 mg of sertraline and on an increased dose of BuSpar and feels like she is in a much better place than she was.  She is has started exercising and getting more active.  She is also scheduled her Pap smear and mammogram with her OB/GYN.  Social History   Socioeconomic History   Marital status: Divorced    Spouse name: Not on file   Number of children: 0   Years of education: Not on file   Highest education level: Not on file  Occupational History   Occupation: VETERINARIAN    Employer: [employer]  Tobacco Use   Smoking status: Never   Smokeless tobacco: Never  Vaping Use   Vaping Use: Never used  Substance and Sexual Activity   Alcohol use: Yes    Comment: Rarely.    Drug use: No   Sexual activity: Not Currently  Other Topics Concern   Not on file  Social History Narrative   Lives alone with three cats.   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    Social Determinants of Health   Financial Resource Strain: Not on file  Food Insecurity: Not on file  Transportation Needs: Not on file  Physical Activity: Not on file  Stress: Not on file  Social Connections: Not on file  Intimate Partner Violence: Not on file   Health Maintenance  Topic Date Due   COVID-19 Vaccine (4 - Booster for [place] series) 06/26/2021 (Originally 08/28/2020)   Zoster Vaccines- Shingrix (1 of 2) 03/15/2022 (Originally 05/26/2012)   INFLUENZA VACCINE  12/25/2020   TETANUS/TDAP  08/06/2021   PAP SMEAR-Modifier  04/03/2022   MAMMOGRAM   04/26/2022   COLONOSCOPY (Pts 45-25yrs Insurance coverage will need to be confirmed)  02/24/2024   Hepatitis C Screening  Completed   HIV Screening  Completed   Pneumococcal Vaccine 59-40 Years old  Aged Out   HPV VACCINES  Aged Out    The following portions of the patient's history were reviewed and updated as appropriate: allergies, current medications, past family history, past medical history, past social history, past surgical history, and problem list.  Review of Systems Pertinent items are noted in HPI.   Objective:    BP 138/78   Pulse 64   Ht 5\' 4"  (1.626 m)   Wt 174 lb (78.9 kg)   SpO2 99%   BMI 29.87 kg/m  General appearance: alert, cooperative, and appears stated age Head: Normocephalic, without obvious abnormality, atraumatic Eyes:  conj clear, EOMI, PEERLA Ears: normal TM's and external ear canals both ears Nose: Nares normal. Septum midline. Mucosa normal. No drainage or sinus tenderness. Throat: lips, mucosa, and tongue normal; teeth and gums normal Neck: no adenopathy, no carotid bruit, no JVD, supple, symmetrical, trachea midline, and thyroid not enlarged, symmetric, no tenderness/mass/nodules Back: symmetric, no curvature. ROM normal. No CVA tenderness. Lungs: clear to auscultation bilaterally Heart: regular rate and rhythm, S1, S2 normal, no murmur, click, rub or gallop Abdomen: soft, non-tender; bowel sounds normal;  no masses,  no organomegaly Extremities: extremities normal, atraumatic, no cyanosis or edema Pulses: 2+ and symmetric Skin: Skin color, texture, turgor normal. No rashes or lesions Neurologic: Grossly normal    Assessment:    Healthy female exam.      Plan:     See After Visit Summary for Counseling Recommendations  Keep up a regular exercise program and make sure you are eating a healthy diet Try to eat 4 servings of dairy a day, or if you are lactose intolerant take a calcium with vitamin D daily.  Your vaccines are up to date.  Due  for labs   MDD - she is doing much better on her current regimen and is engaged in therapy.  PHQ-9 score of 7 and GAD-7 score of 2.  Rates symptoms as somewhat difficult.

## 2020-12-14 LAB — COMPLETE METABOLIC PANEL WITH GFR
AG Ratio: 1.9 (calc) (ref 1.0–2.5)
ALT: 22 U/L (ref 6–29)
AST: 20 U/L (ref 10–35)
Albumin: 4.5 g/dL (ref 3.6–5.1)
Alkaline phosphatase (APISO): 48 U/L (ref 37–153)
BUN: 18 mg/dL (ref 7–25)
CO2: 22 mmol/L (ref 20–32)
Calcium: 9.2 mg/dL (ref 8.6–10.4)
Chloride: 109 mmol/L (ref 98–110)
Creat: 1.01 mg/dL (ref 0.50–1.03)
Globulin: 2.4 g/dL (calc) (ref 1.9–3.7)
Glucose, Bld: 85 mg/dL (ref 65–99)
Potassium: 4.3 mmol/L (ref 3.5–5.3)
Sodium: 141 mmol/L (ref 135–146)
Total Bilirubin: 0.8 mg/dL (ref 0.2–1.2)
Total Protein: 6.9 g/dL (ref 6.1–8.1)
eGFR: 65 mL/min/{1.73_m2} (ref >=60)

## 2020-12-14 LAB — CBC
HCT: 44.5 % (ref 35.0–45.0)
Hemoglobin: 14.5 g/dL (ref 11.7–15.5)
MCH: 30.2 pg (ref 27.0–33.0)
MCHC: 32.6 g/dL (ref 32.0–36.0)
MCV: 92.7 fL (ref 80.0–100.0)
MPV: 11.4 fL (ref 7.5–12.5)
Platelets: 219 10*3/uL (ref 140–400)
RBC: 4.8 10*6/uL (ref 3.80–5.10)
RDW: 11.7 % (ref 11.0–15.0)
WBC: 6.7 10*3/uL (ref 3.8–10.8)

## 2020-12-14 LAB — LIPID PANEL W/REFLEX DIRECT LDL
Cholesterol: 173 mg/dL (ref <200)
HDL: 58 mg/dL (ref >=50)
LDL Cholesterol (Calc): 100 mg/dL (calc) — ABNORMAL HIGH
Non-HDL Cholesterol (Calc): 115 mg/dL (calc) (ref <130)
Total CHOL/HDL Ratio: 3 (calc) (ref <5.0)
Triglycerides: 64 mg/dL (ref <150)

## 2020-12-14 LAB — HEMOGLOBIN A1C
Hgb A1c MFr Bld: 5.2 % of total Hgb (ref <5.7)
Mean Plasma Glucose: 103 mg/dL
eAG (mmol/L): 5.7 mmol/L

## 2020-12-14 NOTE — Progress Notes (Signed)
All labs are normal. 

## 2020-12-20 ENCOUNTER — Ambulatory Visit (INDEPENDENT_AMBULATORY_CARE_PROVIDER_SITE_OTHER): Payer: 59 | Admitting: Professional

## 2020-12-20 DIAGNOSIS — F411 Generalized anxiety disorder: Secondary | ICD-10-CM

## 2020-12-20 DIAGNOSIS — F331 Major depressive disorder, recurrent, moderate: Secondary | ICD-10-CM

## 2021-01-03 ENCOUNTER — Ambulatory Visit (INDEPENDENT_AMBULATORY_CARE_PROVIDER_SITE_OTHER): Payer: 59 | Admitting: Professional

## 2021-01-03 DIAGNOSIS — F331 Major depressive disorder, recurrent, moderate: Secondary | ICD-10-CM

## 2021-01-03 DIAGNOSIS — F411 Generalized anxiety disorder: Secondary | ICD-10-CM

## 2021-01-17 ENCOUNTER — Ambulatory Visit (INDEPENDENT_AMBULATORY_CARE_PROVIDER_SITE_OTHER): Payer: 59 | Admitting: Professional

## 2021-01-17 ENCOUNTER — Encounter: Payer: Self-pay | Admitting: Behavioral Health

## 2021-01-17 ENCOUNTER — Other Ambulatory Visit: Payer: Self-pay

## 2021-01-17 ENCOUNTER — Ambulatory Visit (INDEPENDENT_AMBULATORY_CARE_PROVIDER_SITE_OTHER): Payer: 59 | Admitting: Behavioral Health

## 2021-01-17 DIAGNOSIS — F331 Major depressive disorder, recurrent, moderate: Secondary | ICD-10-CM | POA: Diagnosis not present

## 2021-01-17 DIAGNOSIS — F411 Generalized anxiety disorder: Secondary | ICD-10-CM | POA: Diagnosis not present

## 2021-01-17 MED ORDER — SERTRALINE HCL 100 MG PO TABS: 100.0000 mg | ORAL_TABLET | Freq: Every day | ORAL | 3 refills | Status: DC

## 2021-01-17 NOTE — Progress Notes (Signed)
[facility]  Patient ID: Elizabeth [name],  MRN: [id number]  PCP: Hali Marry, MD  Date of Evaluation: 01/17/2021 Time spent:30 minutes  Chief Complaint:  Chief Complaint   Depression; Anxiety; Follow-up     HISTORY/CURRENT STATUS: HPI 60 year old female presents to this office for follow up and medication management. She is smiling and pleasant. She says that the Zoloft is working well for her overall but feels like medication could be adjusted slightly. She reports decreased anxiety at 3/10 and depression at 3/10. She is sleeping 6 hours per night. 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 No mania, no psychosis, no SI/HI. Past Psychiatric medication failures: Prozac Paxil Cymbalta Effexor Wellbutrin    Individual Medical History/ Review of Systems: Changes? :No   Allergies: Dextromethorphan-guaifenesin, Fluoxetine hcl, and Wellbutrin [bupropion]  Current Medications:  Current Outpatient Medications:    sertraline (ZOLOFT) 100 MG tablet, Take 1 tablet (100 mg total) by mouth daily., Disp: 30 tablet, Rfl: 3   ALPRAZolam (XANAX) 0.25 MG tablet, Take 1 tablet (0.25 mg total) by mouth at bedtime as needed for anxiety., Disp: 30 tablet, Rfl: 0   Ascorbic Acid (VITAMIN C) 1000 MG tablet, Take 1 tablet by mouth daily., Disp: , Rfl:    CAFFEINE PO, Take 50 mg by mouth daily., Disp: , Rfl:    Cholecalciferol (VITAMIN D-3 PO), Take 2,000 Units by mouth daily., Disp: , Rfl:    Coenzyme Q10 (CO Q 10) 100 MG CAPS, Take 100 mg by mouth daily., Disp: , Rfl:    ferrous sulfate 325 (65 FE) MG tablet, Take 325 mg by mouth See admin instructions. Tuesday, Thursday, Saturday and sunday, Disp: , Rfl:    gabapentin (NEURONTIN) 100 MG capsule, Take 100 mg by mouth., Disp: , Rfl:    gabapentin (NEURONTIN) 800 MG tablet, TAKE ONE TABLET BY MOUTH AT BEDTIME, Disp:  90 tablet, Rfl: 1   Glucosamine 750 MG TABS, Take 1 tablet by mouth 2 (two) times daily., Disp: , Rfl:    loratadine (CLARITIN) 10 MG tablet, Take 10 mg by mouth daily., Disp: , Rfl:    Methylcellulose, Laxative, (CITRUCEL) 500 MG TABS, Take by mouth in the morning and at bedtime., Disp: , Rfl:    Multiple Vitamin (MULTIVITAMIN) tablet, Take 1 tablet by mouth daily., Disp: , Rfl:    norethindrone-ethinyl estradiol (FEMHRT 1/5) 1-5 MG-MCG TABS tablet, Take 1 tablet by mouth daily., Disp: , Rfl:    Omega-3 Fatty Acids (FISH OIL) 645 MG CAPS, Take by mouth., Disp: , Rfl:    sertraline (ZOLOFT) 50 MG tablet, Take 1.5 tablets 75 mg  daily., Disp: 45 tablet, Rfl: 3   vitamin E 180 MG (400 UNITS) capsule, Take 400 Units by mouth daily., Disp: , Rfl:  Medication Side Effects: none  Family Medical/ Social History: Changes? No  MENTAL HEALTH EXAM:  There were no vitals taken for this visit.There is no height or weight on file to calculate BMI.  General Appearance: Casual, Neat, and Well Groomed  Eye Contact:  Good  Speech:  Clear and Coherent  Volume:  Normal  Mood:  NA  Affect:  Appropriate  Thought Process:  Coherent  Orientation:  Full (Time, Place, and Person)  Thought Content: Logical   Suicidal Thoughts:  No  Homicidal Thoughts:  No  Memory:  WNL  Judgement:  Good  Insight:  Good  Psychomotor Activity:  Normal  Concentration:  Concentration: Good  Recall:  Good  Fund of Knowledge: Good  Language: Good  Assets:  Desire for Improvement  ADL's:  Intact  Cognition: WNL  Prognosis:  Good    DIAGNOSES:    ICD-10-CM   1. Generalized anxiety disorder  F41.1 sertraline (ZOLOFT) 100 MG tablet    2. Major depressive disorder, recurrent episode, moderate (HCC)  F33.1 sertraline (ZOLOFT) 100 MG tablet      Receiving Psychotherapy: No    RECOMMENDATIONS:   Increase Zoloft to 75 mg daily Continue Buspar 15 mg twice daily Will report side affects or worsening symptoms  promptly Will follow up in 3 months to reassess Greater than 50% of 30 min face to face time with patient was spent on counseling and coordination of care. Patient agreed to DC zoloft if rash develops. She will continue psychotherapy. Emergency contact and after hours information provided. Patient verbally contracted for safety. Refills escribed to patient pharmacy.    [name], NP

## 2021-01-23 ENCOUNTER — Ambulatory Visit: Payer: Self-pay | Admitting: Surgery

## 2021-01-31 ENCOUNTER — Ambulatory Visit (INDEPENDENT_AMBULATORY_CARE_PROVIDER_SITE_OTHER): Payer: 59 | Admitting: Professional

## 2021-01-31 DIAGNOSIS — F411 Generalized anxiety disorder: Secondary | ICD-10-CM

## 2021-01-31 DIAGNOSIS — F331 Major depressive disorder, recurrent, moderate: Secondary | ICD-10-CM

## 2021-02-14 ENCOUNTER — Ambulatory Visit (INDEPENDENT_AMBULATORY_CARE_PROVIDER_SITE_OTHER): Payer: 59 | Admitting: Professional

## 2021-02-14 DIAGNOSIS — F331 Major depressive disorder, recurrent, moderate: Secondary | ICD-10-CM | POA: Diagnosis not present

## 2021-02-14 DIAGNOSIS — F411 Generalized anxiety disorder: Secondary | ICD-10-CM | POA: Diagnosis not present

## 2021-02-28 ENCOUNTER — Ambulatory Visit (INDEPENDENT_AMBULATORY_CARE_PROVIDER_SITE_OTHER): Payer: 59 | Admitting: Professional

## 2021-02-28 DIAGNOSIS — F411 Generalized anxiety disorder: Secondary | ICD-10-CM

## 2021-02-28 DIAGNOSIS — F331 Major depressive disorder, recurrent, moderate: Secondary | ICD-10-CM

## 2021-03-02 ENCOUNTER — Encounter: Payer: Self-pay | Admitting: Family Medicine

## 2021-03-02 NOTE — Progress Notes (Signed)
abstraction

## 2021-03-12 ENCOUNTER — Encounter (HOSPITAL_BASED_OUTPATIENT_CLINIC_OR_DEPARTMENT_OTHER): Payer: Self-pay | Admitting: Surgery

## 2021-03-13 ENCOUNTER — Other Ambulatory Visit: Payer: Self-pay

## 2021-03-13 ENCOUNTER — Encounter (HOSPITAL_BASED_OUTPATIENT_CLINIC_OR_DEPARTMENT_OTHER): Payer: Self-pay | Admitting: Surgery

## 2021-03-13 NOTE — Progress Notes (Addendum)
Spoke w/ via phone for pre-op interview--- pt Lab needs dos----  no             Lab results------ no COVID test -----patient states asymptomatic no test needed Arrive at ------- 0730 on 03-16-2021 NPO after MN NO Solid Food.  Clear liquids from MN until--- 0630 Med rec completed Medications to take morning of surgery ----- buspar, claritin Diabetic medication ----- n/a Patient instructed no nail polish to be worn day of surgery Patient instructed to bring photo id and insurance card day of surgery Patient aware to have Driver (ride ) / caregiver for 24 hours after surgery --friend, deb Patient Special Instructions ----- pt will be picking ensure pre-surgery drink 03-14-2021 with handout instructions between 0900 -- 1100 Pre-Op special Istructions ----- n/a Patient verbalized understanding of instructions that were given at this phone interview. Patient denies shortness of breath, chest pain, fever, cough at this phone interview.

## 2021-03-14 ENCOUNTER — Ambulatory Visit (INDEPENDENT_AMBULATORY_CARE_PROVIDER_SITE_OTHER): Payer: 59 | Admitting: Professional

## 2021-03-14 DIAGNOSIS — F331 Major depressive disorder, recurrent, moderate: Secondary | ICD-10-CM | POA: Diagnosis not present

## 2021-03-14 DIAGNOSIS — F411 Generalized anxiety disorder: Secondary | ICD-10-CM

## 2021-03-16 ENCOUNTER — Ambulatory Visit (HOSPITAL_BASED_OUTPATIENT_CLINIC_OR_DEPARTMENT_OTHER): Payer: 59 | Admitting: Anesthesiology

## 2021-03-16 ENCOUNTER — Encounter (HOSPITAL_BASED_OUTPATIENT_CLINIC_OR_DEPARTMENT_OTHER): Payer: Self-pay | Admitting: Surgery

## 2021-03-16 ENCOUNTER — Other Ambulatory Visit (HOSPITAL_COMMUNITY): Payer: Self-pay

## 2021-03-16 ENCOUNTER — Ambulatory Visit (HOSPITAL_BASED_OUTPATIENT_CLINIC_OR_DEPARTMENT_OTHER)
Admission: RE | Admit: 2021-03-16 | Discharge: 2021-03-16 | Disposition: A | Discharge status: Home / Self Care | Payer: 59 | Attending: Surgery | Admitting: Surgery

## 2021-03-16 ENCOUNTER — Encounter (HOSPITAL_BASED_OUTPATIENT_CLINIC_OR_DEPARTMENT_OTHER)
Admission: RE | Disposition: A | Discharge status: Home / Self Care | Payer: Self-pay | Source: Home / Self Care | Attending: Surgery

## 2021-03-16 DIAGNOSIS — Z808 Family history of malignant neoplasm of other organs or systems: Secondary | ICD-10-CM | POA: Insufficient documentation

## 2021-03-16 DIAGNOSIS — Z888 Allergy status to other drugs, medicaments and biological substances status: Secondary | ICD-10-CM | POA: Insufficient documentation

## 2021-03-16 DIAGNOSIS — Z8261 Family history of arthritis: Secondary | ICD-10-CM | POA: Insufficient documentation

## 2021-03-16 DIAGNOSIS — Z8249 Family history of ischemic heart disease and other diseases of the circulatory system: Secondary | ICD-10-CM | POA: Diagnosis not present

## 2021-03-16 DIAGNOSIS — Z7982 Long term (current) use of aspirin: Secondary | ICD-10-CM | POA: Insufficient documentation

## 2021-03-16 DIAGNOSIS — Z9049 Acquired absence of other specified parts of digestive tract: Secondary | ICD-10-CM | POA: Diagnosis not present

## 2021-03-16 DIAGNOSIS — Z833 Family history of diabetes mellitus: Secondary | ICD-10-CM | POA: Diagnosis not present

## 2021-03-16 DIAGNOSIS — Z8616 Personal history of COVID-19: Secondary | ICD-10-CM | POA: Insufficient documentation

## 2021-03-16 DIAGNOSIS — Z841 Family history of disorders of kidney and ureter: Secondary | ICD-10-CM | POA: Diagnosis not present

## 2021-03-16 DIAGNOSIS — Z79899 Other long term (current) drug therapy: Secondary | ICD-10-CM | POA: Diagnosis not present

## 2021-03-16 DIAGNOSIS — K430 Incisional hernia with obstruction, without gangrene: Secondary | ICD-10-CM | POA: Insufficient documentation

## 2021-03-16 HISTORY — DX: Iron deficiency anemia, unspecified: D50.9

## 2021-03-16 HISTORY — DX: Presence of spectacles and contact lenses: Z97.3

## 2021-03-16 HISTORY — DX: Unspecified abdominal pain: R10.9

## 2021-03-16 HISTORY — DX: Unspecified osteoarthritis, unspecified site: M19.90

## 2021-03-16 HISTORY — DX: Restless legs syndrome: G25.81

## 2021-03-16 HISTORY — PX: INCISIONAL HERNIA REPAIR: SHX193

## 2021-03-16 HISTORY — PX: LAPAROSCOPY: SHX197

## 2021-03-16 HISTORY — DX: Personal history of COVID-19: Z86.16

## 2021-03-16 SURGERY — LAPAROSCOPY, DIAGNOSTIC
Anesthesia: General

## 2021-03-16 MED ORDER — SODIUM CHLORIDE (PF) 0.9 % IJ SOLN
INTRAMUSCULAR | Status: DC | PRN
  Administered 2021-03-16: 1000 mL via INTRAVENOUS

## 2021-03-16 MED ORDER — PROPOFOL 10 MG/ML IV BOLUS
INTRAVENOUS | Status: AC
  Filled 2021-03-16: qty 20

## 2021-03-16 MED ORDER — CEFAZOLIN SODIUM-DEXTROSE 2-4 GM/100ML-% IV SOLN
2.0000 g | INTRAVENOUS | Status: AC
  Administered 2021-03-16: 2 g via INTRAVENOUS

## 2021-03-16 MED ORDER — OXYCODONE HCL 5 MG PO TABS
5.0000 mg | ORAL_TABLET | Freq: Once | ORAL | Status: AC | PRN
  Administered 2021-03-16: 5 mg via ORAL

## 2021-03-16 MED ORDER — FENTANYL CITRATE (PF) 100 MCG/2ML IJ SOLN: 25.0000 ug | INTRAMUSCULAR | Status: DC | PRN

## 2021-03-16 MED ORDER — BUPIVACAINE LIPOSOME 1.3 % IJ SUSP
INTRAMUSCULAR | Status: DC | PRN
  Administered 2021-03-16: 20 mL

## 2021-03-16 MED ORDER — OXYCODONE HCL 5 MG PO TABS
5.0000 mg | ORAL_TABLET | Freq: Four times a day (QID) | ORAL | 0 refills | Status: DC | PRN
  Filled 2021-03-16: qty 20, 4d supply, fill #0

## 2021-03-16 MED ORDER — ROCURONIUM BROMIDE 100 MG/10ML IV SOLN
INTRAVENOUS | Status: DC | PRN
  Administered 2021-03-16: 5 mg via INTRAVENOUS
  Administered 2021-03-16: 50 mg via INTRAVENOUS

## 2021-03-16 MED ORDER — PROPOFOL 10 MG/ML IV BOLUS
INTRAVENOUS | Status: DC | PRN
  Administered 2021-03-16: 40 mg via INTRAVENOUS
  Administered 2021-03-16: 30 mg via INTRAVENOUS
  Administered 2021-03-16: 160 mg via INTRAVENOUS

## 2021-03-16 MED ORDER — ACETAMINOPHEN 325 MG PO TABS: 325.0000 mg | ORAL_TABLET | ORAL | Status: DC | PRN

## 2021-03-16 MED ORDER — DEXAMETHASONE SODIUM PHOSPHATE 10 MG/ML IJ SOLN
INTRAMUSCULAR | Status: AC
  Filled 2021-03-16: qty 1

## 2021-03-16 MED ORDER — PROMETHAZINE HCL 25 MG/ML IJ SOLN: 6.2500 mg | INTRAMUSCULAR | Status: DC | PRN

## 2021-03-16 MED ORDER — ENSURE PRE-SURGERY PO LIQD: 592.0000 mL | Freq: Once | ORAL | Status: DC

## 2021-03-16 MED ORDER — BUPIVACAINE LIPOSOME 1.3 % IJ SUSP: 20.0000 mL | Freq: Once | INTRAMUSCULAR | Status: DC

## 2021-03-16 MED ORDER — SCOPOLAMINE 1 MG/3DAYS TD PT72: 1.0000 | MEDICATED_PATCH | TRANSDERMAL | Status: DC

## 2021-03-16 MED ORDER — ACETAMINOPHEN 160 MG/5ML PO SOLN: 325.0000 mg | ORAL | Status: DC | PRN

## 2021-03-16 MED ORDER — FENTANYL CITRATE (PF) 100 MCG/2ML IJ SOLN
INTRAMUSCULAR | Status: DC | PRN
  Administered 2021-03-16: 25 ug via INTRAVENOUS
  Administered 2021-03-16 (×2): 50 ug via INTRAVENOUS
  Administered 2021-03-16: 25 ug via INTRAVENOUS
  Administered 2021-03-16: 50 ug via INTRAVENOUS

## 2021-03-16 MED ORDER — FENTANYL CITRATE (PF) 250 MCG/5ML IJ SOLN
INTRAMUSCULAR | Status: AC
  Filled 2021-03-16: qty 5

## 2021-03-16 MED ORDER — BUPIVACAINE-EPINEPHRINE 0.25% -1:200000 IJ SOLN
INTRAMUSCULAR | Status: DC | PRN
  Administered 2021-03-16: 50 mL

## 2021-03-16 MED ORDER — ACETAMINOPHEN 500 MG PO TABS
ORAL_TABLET | ORAL | Status: AC
  Filled 2021-03-16: qty 2

## 2021-03-16 MED ORDER — ACETAMINOPHEN 500 MG PO TABS
1000.0000 mg | ORAL_TABLET | ORAL | Status: AC
  Administered 2021-03-16: 1000 mg via ORAL

## 2021-03-16 MED ORDER — CELECOXIB 200 MG PO CAPS
200.0000 mg | ORAL_CAPSULE | ORAL | Status: AC
  Administered 2021-03-16: 200 mg via ORAL

## 2021-03-16 MED ORDER — ONDANSETRON HCL 4 MG/2ML IJ SOLN
INTRAMUSCULAR | Status: AC
  Filled 2021-03-16: qty 2

## 2021-03-16 MED ORDER — LACTATED RINGERS IV SOLN: INTRAVENOUS | Status: DC

## 2021-03-16 MED ORDER — LIDOCAINE 2% (20 MG/ML) 5 ML SYRINGE
INTRAMUSCULAR | Status: AC
  Filled 2021-03-16: qty 5

## 2021-03-16 MED ORDER — ENSURE PRE-SURGERY PO LIQD: 296.0000 mL | Freq: Once | ORAL | Status: DC

## 2021-03-16 MED ORDER — CHLORHEXIDINE GLUCONATE CLOTH 2 % EX PADS: 6.0000 | MEDICATED_PAD | Freq: Once | CUTANEOUS | Status: DC

## 2021-03-16 MED ORDER — EPHEDRINE 5 MG/ML INJ
INTRAVENOUS | Status: AC
  Filled 2021-03-16: qty 5

## 2021-03-16 MED ORDER — DEXAMETHASONE SODIUM PHOSPHATE 4 MG/ML IJ SOLN
INTRAMUSCULAR | Status: DC | PRN
  Administered 2021-03-16: 8 mg via INTRAVENOUS

## 2021-03-16 MED ORDER — AMISULPRIDE (ANTIEMETIC) 5 MG/2ML IV SOLN
10.0000 mg | Freq: Once | INTRAVENOUS | Status: AC | PRN
  Administered 2021-03-16: 10 mg via INTRAVENOUS

## 2021-03-16 MED ORDER — GABAPENTIN 300 MG PO CAPS
ORAL_CAPSULE | ORAL | Status: AC
  Filled 2021-03-16: qty 1

## 2021-03-16 MED ORDER — LIDOCAINE HCL (CARDIAC) PF 100 MG/5ML IV SOSY
PREFILLED_SYRINGE | INTRAVENOUS | Status: DC | PRN
Start: 2021-03-16 — End: 2021-03-16
  Administered 2021-03-16: 60 mg via INTRAVENOUS

## 2021-03-16 MED ORDER — CEFAZOLIN SODIUM-DEXTROSE 2-4 GM/100ML-% IV SOLN
INTRAVENOUS | Status: AC
  Filled 2021-03-16: qty 100

## 2021-03-16 MED ORDER — ONDANSETRON HCL 4 MG/2ML IJ SOLN
INTRAMUSCULAR | Status: DC | PRN
  Administered 2021-03-16: 4 mg via INTRAVENOUS

## 2021-03-16 MED ORDER — ACETAMINOPHEN 10 MG/ML IV SOLN: 1000.0000 mg | Freq: Once | INTRAVENOUS | Status: DC | PRN

## 2021-03-16 MED ORDER — CELECOXIB 200 MG PO CAPS
ORAL_CAPSULE | ORAL | Status: AC
  Filled 2021-03-16: qty 1

## 2021-03-16 MED ORDER — AMISULPRIDE (ANTIEMETIC) 5 MG/2ML IV SOLN
INTRAVENOUS | Status: AC
  Filled 2021-03-16: qty 4

## 2021-03-16 MED ORDER — SUGAMMADEX SODIUM 200 MG/2ML IV SOLN
INTRAVENOUS | Status: DC | PRN
  Administered 2021-03-16: 200 mg via INTRAVENOUS

## 2021-03-16 MED ORDER — GABAPENTIN 300 MG PO CAPS
300.0000 mg | ORAL_CAPSULE | ORAL | Status: AC
  Administered 2021-03-16: 300 mg via ORAL

## 2021-03-16 MED ORDER — PHENYLEPHRINE 40 MCG/ML (10ML) SYRINGE FOR IV PUSH (FOR BLOOD PRESSURE SUPPORT)
PREFILLED_SYRINGE | INTRAVENOUS | Status: AC
  Filled 2021-03-16: qty 20

## 2021-03-16 MED ORDER — MIDAZOLAM HCL 2 MG/2ML IJ SOLN
INTRAMUSCULAR | Status: DC | PRN
  Administered 2021-03-16: 2 mg via INTRAVENOUS

## 2021-03-16 MED ORDER — PHENYLEPHRINE HCL (PRESSORS) 10 MG/ML IV SOLN
INTRAVENOUS | Status: DC | PRN
Start: 2021-03-16 — End: 2021-03-16
  Administered 2021-03-16: 80 ug via INTRAVENOUS

## 2021-03-16 MED ORDER — OXYCODONE HCL 5 MG/5ML PO SOLN: 5.0000 mg | Freq: Once | ORAL | Status: AC | PRN

## 2021-03-16 MED ORDER — MIDAZOLAM HCL 2 MG/2ML IJ SOLN
INTRAMUSCULAR | Status: AC
  Filled 2021-03-16: qty 2

## 2021-03-16 MED ORDER — OXYCODONE HCL 5 MG PO TABS
ORAL_TABLET | ORAL | Status: AC
  Filled 2021-03-16: qty 1

## 2021-03-16 MED ORDER — METHOCARBAMOL 750 MG PO TABS
750.0000 mg | ORAL_TABLET | Freq: Four times a day (QID) | ORAL | 2 refills | Status: DC | PRN
  Filled 2021-03-16: qty 30, 8d supply, fill #0

## 2021-03-16 SURGICAL SUPPLY — 51 items
APL PRP STRL LF DISP 70% ISPRP (MISCELLANEOUS) ×1
APPLIER CLIP 5 13 M/L LIGAMAX5 (MISCELLANEOUS)
APR CLP MED LRG 5 ANG JAW (MISCELLANEOUS)
BINDER ABDOMINAL 12 ML 46-62 (SOFTGOODS) ×4 IMPLANT
CABLE HIGH FREQUENCY MONO STRZ (ELECTRODE) ×2 IMPLANT
CHLORAPREP W/TINT 26 (MISCELLANEOUS) ×2 IMPLANT
CLIP APPLIE 5 13 M/L LIGAMAX5 (MISCELLANEOUS) IMPLANT
DECANTER SPIKE VIAL GLASS SM (MISCELLANEOUS) ×2 IMPLANT
DEVICE SECURE STRAP 25 ABSORB (INSTRUMENTS) ×2 IMPLANT
DEVICE TROCAR PUNCTURE CLOSURE (ENDOMECHANICALS) ×2 IMPLANT
DRAPE WARM FLUID 44X44 (DRAPES) ×2 IMPLANT
DRSG TEGADERM 2-3/8X2-3/4 SM (GAUZE/BANDAGES/DRESSINGS) ×6 IMPLANT
DRSG TEGADERM 4X4.75 (GAUZE/BANDAGES/DRESSINGS) ×2 IMPLANT
ELECT REM PT RETURN 9FT ADLT (ELECTROSURGICAL) ×2
ELECTRODE REM PT RTRN 9FT ADLT (ELECTROSURGICAL) ×1 IMPLANT
GAUZE 4X4 16PLY ~~LOC~~+RFID DBL (SPONGE) ×2 IMPLANT
GLOVE SRG 8 PF TXTR STRL LF DI (GLOVE) ×1 IMPLANT
GLOVE SURG LTX SZ8 (GLOVE) ×2 IMPLANT
GLOVE SURG UNDER POLY LF SZ8 (GLOVE) ×2
GOWN STRL REUS W/TWL XL LVL3 (GOWN DISPOSABLE) ×2 IMPLANT
IRRIG SUCT STRYKERFLOW 2 WTIP (MISCELLANEOUS)
IRRIGATION SUCT STRKRFLW 2 WTP (MISCELLANEOUS) IMPLANT
KIT TURNOVER CYSTO (KITS) ×2 IMPLANT
MESH VENTRALIGHT ST 6X8 (Mesh Specialty) ×2 IMPLANT
MESH VENTRLGHT ELLIPSE 8X6XMFL (Mesh Specialty) ×1 IMPLANT
NEEDLE HYPO 22GX1.5 SAFETY (NEEDLE) ×4 IMPLANT
NEEDLE SPNL 22GX3.5 QUINCKE BK (NEEDLE) IMPLANT
NS IRRIG 500ML POUR BTL (IV SOLUTION) ×2 IMPLANT
PACK BASIN DAY SURGERY FS (CUSTOM PROCEDURE TRAY) ×2 IMPLANT
PAD POSITIONING PINK XL (MISCELLANEOUS) ×2 IMPLANT
SCISSORS LAP 5X35 DISP (ENDOMECHANICALS) ×2 IMPLANT
SET TUBE SMOKE EVAC HIGH FLOW (TUBING) ×2 IMPLANT
SHEARS HARMONIC ACE PLUS 36CM (ENDOMECHANICALS) IMPLANT
SLEEVE ADV FIXATION 5X100MM (TROCAR) ×2 IMPLANT
SPONGE GAUZE 2X2 8PLY STRL LF (GAUZE/BANDAGES/DRESSINGS) ×6 IMPLANT
SPONGE T-LAP 18X18 ~~LOC~~+RFID (SPONGE) ×2 IMPLANT
STRIP CLOSURE SKIN 1/2X4 (GAUZE/BANDAGES/DRESSINGS) ×4 IMPLANT
SUT MNCRL AB 4-0 PS2 18 (SUTURE) ×2 IMPLANT
SUT PDS AB 1 CT1 27 (SUTURE) ×2 IMPLANT
SUT PROLENE 1 CT 1 30 (SUTURE) ×8 IMPLANT
SUT VIC AB 2-0 SH 27 (SUTURE)
SUT VIC AB 2-0 SH 27XBRD (SUTURE) IMPLANT
SUT VIC AB 3-0 SH 27 (SUTURE)
SUT VIC AB 3-0 SH 27X BRD (SUTURE) IMPLANT
SUT VICRYL 0 UR6 27IN ABS (SUTURE) ×4 IMPLANT
TOWEL OR 17X26 10 PK STRL BLUE (TOWEL DISPOSABLE) ×2 IMPLANT
TRAY LAPAROSCOPIC (CUSTOM PROCEDURE TRAY) ×2 IMPLANT
TROCAR ADV FIXATION 11X100MM (TROCAR) IMPLANT
TROCAR ADV FIXATION 5X100MM (TROCAR) ×2 IMPLANT
TROCAR BLADELESS OPT 5 100 (ENDOMECHANICALS) ×2 IMPLANT
WATER STERILE IRR 500ML POUR (IV SOLUTION) ×2 IMPLANT

## 2021-03-16 NOTE — Discharge Instructions (Addendum)
[facility] REPA[facility]: POST OP INSTRUCTIONS  ######################################################################  EAT Gradually transition to a high fiber diet with a fiber supplement over the next few weeks after discharge.  Start with a pureed / full liquid diet (see below)  WALK Walk an hour a day.  Control your pain to do that.    CONTROL PAIN Control pain so that you can walk, sleep, tolerate sneezing/coughing, and go up/down stairs.  HAVE A BOWEL MOVEMENT DAILY Keep your bowels regular to avoid problems.  OK to try a laxative to override constipation.  OK to use an antidairrheal to slow down diarrhea.  Call if not better after 2 tries  CALL IF YOU HAVE PROBLEMS/CONCERNS Call if you are still struggling despite following these instructions. Call if you have concerns not answered by these instructions  ######################################################################    DIET: Follow a light bland diet & liquids the first 24 hours after arrival home, such as soup, liquids, starches, etc.  Be sure to drink plenty of fluids.  Quickly advance to a usual solid diet within a few days.  Avoid fast food or heavy meals as your are more likely to get nauseated or have irregular bowels.  A low-fat, high-fiber diet for the rest of your life is ideal.   Take your usually prescribed home medications unless otherwise directed.  PAIN CONTROL: Pain is best controlled by a usual combination of three different methods TOGETHER: Ice/Heat Over the counter pain medication Prescription pain medication Most patients will experience some swelling and bruising around the hernia(s) such as the bellybutton, groins, or old incisions.  Ice packs or heating pads (30-60 minutes up to 6 times a day) will help. Use ice for the first few days to help decrease swelling and bruising, then switch to heat to help relax tight/sore spots and speed recovery.  Some people prefer to use ice alone, heat alone, alternating  between ice & heat.  Experiment to what works for you.  Swelling and bruising can take several weeks to resolve.   It is helpful to take an over-the-counter pain medication regularly for the first few weeks.  Choose one of the following that works best for you: Naproxen (Aleve, etc)  Two 244m tabs twice a day Ibuprofen (Advil, etc) Three 2045mtabs four times a day (every meal & bedtime) Acetaminophen (Tylenol, etc) 325-65043mour times a day (every meal & bedtime) A  prescription for pain medication should be given to you upon discharge.  Take your pain medication as prescribed.  If you are having problems/concerns with the prescription medicine (does not control pain, nausea, vomiting, rash, itching, etc), please call us [phone] see if we need to switch you to a different pain medicine that will work better for you and/or control your side effect better. If you need a refill on your pain medication, please contact your pharmacy.  They will contact our office to request authorization. Prescriptions will not be filled after 5 pm or on week-ends.  Avoid getting constipated.  Between the surgery and the pain medications, it is common to experience some constipation.  Increasing fluid intake and taking a fiber supplement (such as Metamucil, Citrucel, FiberCon, MiraLax, etc) 1-2 times a day regularly will usually help prevent this problem from occurring.  A mild laxative (prune juice, Milk of Magnesia, MiraLax, etc) should be taken according to package directions if there are no bowel movements after 48 hours.    Wash / shower every day.  You may shower over the dressings  as they are waterproof.    Remove your waterproof bandages, skin tapes, and other bandages 3 days after surgery. You may replace a dressing/Band-Aid to cover the incision for comfort if you wish. You may leave the incisions open to air.  You may replace a dressing/Band-Aid to cover an incision for comfort if you wish.  Continue  to shower over incision(s) after the dressing is off.  ACTIVITIES as tolerated:   You may resume regular (light) daily activities beginning the next day--such as daily self-care, walking, climbing stairs--gradually increasing activities as tolerated.  Control your pain so that you can walk an hour a day.  If you can walk 30 minutes without difficulty, it is safe to try more intense activity such as jogging, treadmill, bicycling, low-impact aerobics, swimming, etc. Save the most intensive and strenuous activity for last such as sit-ups, heavy lifting, contact sports, etc  Refrain from any heavy lifting or straining until you are off narcotics for pain control.   DO NOT PUSH THROUGH PAIN.  Let pain be your guide: If it hurts to do something, don't do it.  Pain is your body warning you to avoid that activity for another week until the pain goes down. You may drive when you are no longer taking prescription pain medication, you can comfortably wear a seatbelt, and you can safely maneuver your car and apply brakes. You may have sexual intercourse when it is comfortable.   FOLLOW UP in our office Please call [facility] at [phone] to set up an appointment to see your surgeon in the office for a follow-up appointment approximately 2-3 weeks after your surgery. Make sure that you call for this appointment the day you arrive home to insure a convenient appointment time.  9.  If you have disability of FMLA / Family leave forms, please bring the forms to the office for processing.  (do not give to your surgeon).  WHEN TO CALL us [phone]: Poor pain control Reactions / problems with new medications (rash/itching, nausea, etc)  Fever over 101.5 F (38.5 C) Inability to urinate Nausea and/or vomiting Worsening swelling or bruising Continued bleeding from incision. Increased pain, redness, or drainage from the incision   The clinic staff is available to answer your questions during regular business  hours (8:30am-5pm).  Please don't hesitate to call and ask to speak to one of our nurses for clinical concerns.   If you have a medical emergency, go to the nearest emergency room or call 911.  A surgeon from [facility] is always on call at the hospitals in [facility], [place], [place], [place], [place]  [address] ?  [address] [address], [place],    [address] MAIN: [phone] ? TOLL FREE: [phone] ? FAX: [phone] [email or web address]  Information for Discharge Teaching: EXPAREL (bupivacaine liposome injectable suspension)   Your surgeon or anesthesiologist gave you EXPAREL(bupivacaine) to help control your pain after surgery.  EXPAREL is a local anesthetic that provides pain relief by numbing the tissue around the surgical site. EXPAREL is designed to release pain medication over time and can control pain for up to 72 hours. Depending on how you respond to EXPAREL, you may require less pain medication during your recovery.  Possible side effects: Temporary loss of sensation or ability to move in the area where bupivacaine was injected. Nausea, vomiting, constipation Rarely, numbness and tingling in your mouth or lips, lightheadedness, or anxiety may occur. Call your doctor right  away if you think you may be experiencing any of these sensations, or if you have other questions regarding possible side effects.  Follow all other discharge instructions given to you by your surgeon or nurse. Eat a healthy diet and drink plenty of water or other fluids.  If you return to the hospital for any reason within 96 hours following the administration of EXPAREL, it is important for health care providers to know that you have received this anesthetic. A teal colored band has been placed on your arm with the date, time and amount of EXPAREL you have received in order to alert and inform your health care providers. Please leave  this armband in place for the full 96 hours following administration, and then you may remove the band.   Post Anesthesia Home Care Instructions  Activity: Get plenty of rest for the remainder of the day. A responsible individual must stay with you for 24 hours following the procedure.  For the next 24 hours, DO NOT: -Drive a car -Paediatric nurse -Drink alcoholic beverages -Take any medication unless instructed by your physician -Make any legal decisions or sign important papers.  Meals: Start with liquid foods such as gelatin or soup. Progress to regular foods as tolerated. Avoid greasy, spicy, heavy foods. If nausea and/or vomiting occur, drink only clear liquids until the nausea and/or vomiting subsides. Call your physician if vomiting continues.  Special Instructions/Symptoms: Your throat may feel dry or sore from the anesthesia or the breathing tube placed in your throat during surgery. If this causes discomfort, gargle with warm salt water. The discomfort should disappear within 24 hours.    No acetaminophen/Tylenol until after 2:15 pm today if needed. No ibuprofen, Advil, Aleve, Motrin, ketorolac, meloxicam, or naproxen until after 2:15 pm today if needed.

## 2021-03-16 NOTE — H&P (Signed)
03/16/2021    PROVIDER: Hollace Kinnier, MD  Patient Care Team: Thresa Ross, MD as PCP - General (Family Medicine) Elizabeth [name], Elizabeth Saran, MD as Consulting Provider (General Surgery) Pinyan, Elnita Maxwell, MD (Neurology) Elizabeth Lah, NP (Obstetrics and Gynecology)  [facility] MRN: [id number] DOB: May 25, 1961   Interval History:   The patient returns to the office after undergoing urgent laparoscopic cholecystectomy on 08/10/2020  Patient notes that the abdominal pain is still there. Denies get much of relief with these second injection. Feels like it is a little more obvious now. Some periumbilical and again right supraumbilical paramedian discomfort. Usually with sitting. Now with standing as well. Does not necessarily seem to be with lifting or coughing or sneezing. She gets a mild twinge of nausea with it. Does not feel a lump. No fevers or chills.  PRIOR NOTE 01/02/2021 She notes that the injection definitely helped for about 10 days but then she felt the pain coming back. She still has 2 spots 1 to the right umbilicus and one a little more in the midclavicular line on the right side. Not so bad with coughing or sneezing at this point but if she sits for prolonged periods or does some activity which she needs to do as of that, it can get irritated. She has been doing the Naprosyn 500 twice a day. She is trying low-dose gabapentin but it makes her a little fuzzy/hazy so she cannot do a high dose during the daytime. He divet feel the injection helped but wonders if another 1 would be of use. No nausea or vomiting. No fevers or chills. Appetite & energy level otherwise very good  PRIOR NOTE 12/05/2020: Patient was seen in the office a few weeks later doing relatively well with some loose stools. There is concern about her umbilical incision and had a little bit of drainage but seem to clear up. Then she had episode of sharp pain. Labs and CAT scan negative for any evidence of abscess  or leak or hernia. Anti-inflammatory pain regimen recommended. That was discussed in May.  Patient comes back noting that she still gets a sharp pain to the right of her bellybutton incision and some burning a few centimeters lateral to that. She is back to working in a [employer]. She is try to avoid heavy lifting. She did try Tylenol ibuprofen and naproxen for the past few months. She thinks she was on Aleve at least for couple of weeks. Prefers ice over heat. Not much help. She takes gabapentin 800 mg at night for restless leg. Does not like to take much gabapentin during the day because it makes her feel a little loopy.  She is having more regular bowel movements without any loose bowel movements any poor. Appetite good. Not having more attacks. Your incision is closed up. No more drainage. No fevers or chills. She is just disappointed that she still gets some Sharples there that have been going on for the past 4 months intermittently    Labs, Imaging and Diagnostic Testing:  Located in [place]' section of Epic EMR chart  CLINICAL DATA: Generalized abdominal pain. History of laparoscopic cholecystectomy 2 months ago   EXAM: CT ABDOMEN AND PELVIS WITHOUT CONTRAST   TECHNIQUE: Multidetector CT imaging of the abdomen and pelvis was performed following the standard protocol without IV contrast.   COMPARISON: 08/09/2020   FINDINGS: Lower chest: Included lung bases are clear. Heart size is normal.   Hepatobiliary: Subcentimeter probable cyst at the left hepatic  dome, unchanged. Otherwise unremarkable unenhanced appearance of the liver. Interval cholecystectomy. No fluid collections within the gallbladder fossa.   Pancreas: Unremarkable. No pancreatic ductal dilatation or surrounding inflammatory changes.   Spleen: Normal in size without focal abnormality.   Adrenals/Urinary Tract: Adrenal glands are unremarkable. Kidneys are normal, without renal calculi, focal  lesion, or hydronephrosis. Bladder is unremarkable for the degree of distension.   Stomach/Bowel: Stomach and bowel are well distended with oral contrast. Stomach is within normal limits. Appendix appears normal. No evidence of bowel wall thickening, distention, or inflammatory changes.   Vascular/Lymphatic: No significant vascular findings are evident on noncontrast study. No enlarged abdominal or pelvic lymph nodes.   Reproductive: Probable small uterine fibroids. Bilateral adnexa are unremarkable.   Other: No free fluid. No abdominopelvic fluid collection. No pneumoperitoneum. No abdominal wall hernia.   Musculoskeletal: No acute or significant osseous findings. Degenerative facet arthropathy of the lower lumbar spine.   IMPRESSION: 1. No acute abdominopelvic findings. 2. Interval cholecystectomy. No fluid collections within the gallbladder fossa.     Electronically Signed By: [name] D.O. On: 10/12/2020 14:53    PRIOR NOTES   TRANISHA TISSUE Appointment: 10/12/2020 11:30 AM Location: [facility] Patient #: [id number] DOB: May 27, 1961 Single / Language: [name] / Race: White [name]  History of Present Illness Elizabeth Shropshire PA C; 10/12/2020 11:23 AM) The patient is a 60 year old [name] presenting status-post cholecystectomy. Emergent laparoscopic single site cholecystectomy was performed on 08/10/20 by Dr. Johney [name].  Discharged post-op day 1 with Augmentin, which she completed.  She followed up with me at the end of March she was having some loose stools and was taking Imodium.  She is now taking Citrucel and is not having any bowel issues.  However, 4 days ago she developed a sharp pain superior and to the right of her umbilicus.  She feels like she notices it more when she is sitting and lying down.  When she presses on the area does make her nauseated, but she denies vomiting.  She states NSAIDs and Tylenol help with the pain.  She has not been any lifting  recently.  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  Allergies (Elizabeth [name], CMA; 10/12/2020 11:01 AM) Robitussin 12 Hour Cough *COUGH/COLD/ALLERGY*   Wellbutrin *ANTIDEPRESSANTS*   PROzac *ANTIDEPRESSANTS*   Allergies Reconciled    Medication History (Elizabeth [name], CMA; 10/12/2020 11:01 AM) Gabapentin  (800MG  Tablet, Oral) Active. Norethindrone-Eth Estradiol  (1-5MG -MCG Tablet, Oral) Active. Tylenol 8 Hour  (650MG  Tablet ER, Oral) Active. BuSpar  (10MG  Tablet, Oral) Active. Vitamin E  (180 MG(400 UNIT) Capsule, Oral) Active. Ascorbic Acid  (500MG  Tablet, Oral) Active. Ferrous Sulfate  (325 (65 Fe)MG Tablet, Oral) Active. Glucosamine HCl  (750MG  Tablet, Oral) Active. Coenzyme Q10  (60MG  Capsule, Oral) Active. Aspirin  (81MG  Tablet DR, Oral) Active. Caffeine Citrate  (60MG /3ML Solution, Oral) Active. Medications Reconciled   Review of Systems General Not Present- Appetite Loss, Chills, Fatigue, Fever, Night Sweats, Weight Gain and Weight Loss. Skin Not Present- Change in Wart/Mole, Dryness, Hives, Jaundice, New Lesions, Non-Healing Wounds, Rash and Ulcer. HEENT Present- Seasonal Allergies and Wears glasses/contact lenses. Not Present- Earache, Hearing Loss, Hoarseness, Nose Bleed, Oral Ulcers, Ringing in the Ears, Sinus Pain, Sore Throat, Visual Disturbances and Yellow Eyes. Respiratory Not Present- Bloody sputum, Chronic Cough, Difficulty Breathing, Snoring and Wheezing. Breast Not Present- Breast Mass, Breast Pain, Nipple Discharge and Skin Changes. Cardiovascular Not Present- Chest Pain, Difficulty Breathing Lying Down, Leg Cramps,  Palpitations, Rapid Heart Rate, Shortness of Breath and Swelling of Extremities. Gastrointestinal Present- Change in Bowel Habits. Not Present- Abdominal Pain, Bloating, Bloody Stool, Chronic diarrhea, Constipation, Difficulty Swallowing, Excessive gas, Gets full quickly at meals, Hemorrhoids, Indigestion, Nausea, Rectal Pain and  Vomiting. [name] Genitourinary Not Present- Frequency, Nocturia, Painful Urination, Pelvic Pain and Urgency. Musculoskeletal Not Present- Back Pain, Joint Pain, Joint Stiffness, Muscle Pain, Muscle Weakness and Swelling of Extremities. Neurological Not Present- Decreased Memory, Fainting, Headaches, Numbness, Seizures, Tingling, Tremor, Trouble walking and Weakness.  Assessment & Plan ([place] PA C; 10/12/2020 11:25 AM) S/P LAPAROSCOPIC CHOLECYSTECTOMY (Z90.49) Impression: She is presented to the office 2 months out from emergent laparoscopic cholecystectomy. She does have a small amount of fullness superior and to the right of her umbilicus. We discussed the possibility of this being scar tissue versus a small hernia. Due to the shortage of IV contrast, we ordered a noncontrast CT abdomen and pelvis to rule out hernia. She will call if she develops any worsening issues prior to CT scan. If the CT is negative and she continues to have pain, she may need to follow-up with Dr. Johney [name] or her PCP. Current Plans Follow Up - Call [facility] office after tests / studies done to discuss further plans - We are going to order a CT scan to be done hopefully by tomorrow - If you have any worsening issues before the CT scan is done, please call the office with an update  Signed by Elizabeth Shropshire, PA C (10/12/2020 11:26 AM)  Co-signed by Felicie Morn MD (10/12/2020 12:4 #################################################  Elizabeth [name] Appointment: 10/12/2020 11:30 AM Location: [facility] Patient #: [id number] DOB: 1960-06-30 Single / Language: [name] / Race: White [name]  History of Present Illness Elizabeth Shropshire PA C; 10/12/2020 11:23 AM) The patient is a 60 year old [name] presenting status-post cholecystectomy. Emergent laparoscopic single site cholecystectomy was performed on 08/10/20 by Dr. Johney [name].  Discharged post-op day 1 with Augmentin, which she completed.  She followed up with me at  the end of March she was having some loose stools and was taking Imodium.  She is now taking Citrucel and is not having any bowel issues.  However, 4 days ago she developed a sharp pain superior and to the right of her umbilicus.  She feels like she notices it more when she is sitting and lying down.  When she presses on the area does make her nauseated, but she denies vomiting.  She states NSAIDs and Tylenol help with the pain.  She has not been any lifting recently.  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  Allergies (Elizabeth [name], CMA; 10/12/2020 11:01 AM) Robitussin 12 Hour Cough *COUGH/COLD/ALLERGY*   Wellbutrin *ANTIDEPRESSANTS*   PROzac *ANTIDEPRESSANTS*   Allergies Reconciled    Medication History (Elizabeth [name], CMA; 10/12/2020 11:01 AM) Gabapentin  (800MG  Tablet, Oral) Active. Norethindrone-Eth Estradiol  (1-5MG -MCG Tablet, Oral) Active. Tylenol 8 Hour  (650MG  Tablet ER, Oral) Active. BuSpar  (10MG  Tablet, Oral) Active. Vitamin E  (180 MG(400 UNIT) Capsule, Oral) Active. Ascorbic Acid  (500MG  Tablet, Oral) Active. Ferrous Sulfate  (325 (65 Fe)MG Tablet, Oral) Active. Glucosamine HCl  (750MG  Tablet, Oral) Active. Coenzyme Q10  (60MG  Capsule, Oral) Active. Aspirin  (81MG  Tablet DR, Oral) Active. Caffeine Citrate  (60MG /3ML Solution, Oral) Active. Medications Reconciled   Review of Systems (Elizabeth Arnette Schaumann PA C; 10/12/2020 11:25 AM) General Not Present- Appetite Loss, Chills, Fatigue, Fever, Night Sweats, Weight  Gain and Weight Loss. Skin Not Present- Change in Wart/Mole, Dryness, Hives, Jaundice, New Lesions, Non-Healing Wounds, Rash and Ulcer. HEENT Present- Seasonal Allergies and Wears glasses/contact lenses. Not Present- Earache, Hearing Loss, Hoarseness, Nose Bleed, Oral Ulcers, Ringing in the Ears, Sinus Pain, Sore Throat, Visual Disturbances and Yellow Eyes. Respiratory Not Present- Bloody sputum, Chronic Cough, Difficulty Breathing, Snoring and  Wheezing. Breast Not Present- Breast Mass, Breast Pain, Nipple Discharge and Skin Changes. Cardiovascular Not Present- Chest Pain, Difficulty Breathing Lying Down, Leg Cramps, Palpitations, Rapid Heart Rate, Shortness of Breath and Swelling of Extremities. Gastrointestinal Present- Change in Bowel Habits. Not Present- Abdominal Pain, Bloating, Bloody Stool, Chronic diarrhea, Constipation, Difficulty Swallowing, Excessive gas, Gets full quickly at meals, Hemorrhoids, Indigestion, Nausea, Rectal Pain and Vomiting. [name] Genitourinary Not Present- Frequency, Nocturia, Painful Urination, Pelvic Pain and Urgency. Musculoskeletal Not Present- Back Pain, Joint Pain, Joint Stiffness, Muscle Pain, Muscle Weakness and Swelling of Extremities. Neurological Not Present- Decreased Memory, Fainting, Headaches, Numbness, Seizures, Tingling, Tremor, Trouble walking and Weakness.  Physical Exam (Elizabeth Arnette Schaumann PA C; 10/12/2020 11:23 AM) General Note:  Alert, oriented, in no acute distress  Chest and Lung Exam Note:  Effort normal  Abdomen Note:  Soft, non-tender, non-distended. Incision dry and intact. No surrounding erythema or drainage. Superior and to the right of the umbilicus is a small amount of fullness when standing and supine. No obvious bulge  Assessment & Plan ([place] PA C; 10/12/2020 11:25 AM) S/P LAPAROSCOPIC CHOLECYSTECTOMY (Z90.49) Impression: She is presented to the office 2 months out from emergent laparoscopic cholecystectomy. She does have a small amount of fullness superior and to the right of her umbilicus. We discussed the possibility of this being scar tissue versus a small hernia. Due to the shortage of IV contrast, we ordered a noncontrast CT abdomen and pelvis to rule out hernia. She will call if she develops any worsening issues prior to CT scan. If the CT is negative and she continues to have pain, she may need to follow-up with Dr. Johney [name] or her PCP. Current Plans Follow Up -  Call [facility] office after tests / studies done to discuss further plans - We are going to order a CT scan to be done hopefully by tomorrow - If you have any worsening issues before the CT scan is done, please call the office with an update  Signed by Elizabeth Shropshire, PA C (10/12/2020 11:26 AM)  Co-signed by Felicie Morn MD (10/12/2020 12:40 PM)  SURGERY NOTES:  Located in [place]' section of Epic EMR chart   08/10/2020   PATIENT: Elizabeth [name] 60 y.o. [name]   Patient Care Team: Hali Marry, MD as PCP - General (Family Medicine) Mickel Fuchs, MD as Referring Physician (Neurology) Elizabeth Lah, NP as Referring Physician (Obstetrics and Gynecology)   PRE-OPERATIVE DIAGNOSIS:    Acute on Chronic [place] Cholecystitis   POST-OPERATIVE DIAGNOSIS:    Acute on Chronic [place] Cholecystitis   PROCEDURE: SINGLE SITE Laparoscopic cholecystectomy (CPT code [phone])   SURGEON: Adin Hector, MD, FACS.   ASSISTANT: OR Staff    ANESTHESIA:  General with endotracheal intubation Local anesthetic as a field block   EBL: (See Anesthesia Intraoperative Record) Total I/O In: 3350 [I.V.:1000; IV Piggyback:2350] Out: -    Delay start of Pharmacological VTE agent (>24hrs) due to surgical blood loss or risk of bleeding: no   DRAINS: None    SPECIMEN: Gallbladder    DISPOSITION OF SPECIMEN: PATHOLOGY   COUNTS:  YES   PLAN OF CARE: Admit to inpatient    PATIENT DISPOSITION: PACU - hemodynamically stable.   INDICATION: Pleasant woman with pain and probable biliary colic. Gallbladder attack would not go away. Examination concerning for cholecystitis. Recommendation made for hospitalization, IV antibiotics, nausea/pain control. Cholecystectomy.   The anatomy & physiology of hepatobiliary & pancreatic function was discussed. The pathophysiology of gallbladder dysfunction was discussed. Natural history risks without surgery was discussed. I feel the risks of no  intervention will lead to serious problems that outweigh the operative risks; therefore, I recommended cholecystectomy to remove the pathology. I explained laparoscopic techniques with possible need for an open approach. Probable cholangiogram to evaluate the bilary tract was explained as well.    Risks such as bleeding, infection, abscess, leak, injury to other organs, need for further treatment, heart attack, death, and other risks were discussed. I noted a good likelihood this will help address the problem. Possibility that this will not correct all abdominal symptoms was explained. Goals of post-operative recovery were discussed as well. We will work to minimize complications. An educational handout further explaining the pathology and treatment options was given as well. Questions were answered. The patient expresses understanding & wishes to proceed with surgery.   OR FINDINGS: Very dilated gallbladder with chronic changes and edema consistent with acute on chronic cholecystitis.   Very foreshortened cystic duct and not able to pass cholangiogram catheter in suspicious for cystic duct obstruction. No cholangiogram done.   Liver: normal   DESCRIPTION:    The patient was identified & brought in the operating room. The patient was positioned supine with arms tucked. SCDs were active during the entire case. The patient underwent general anesthesia without any difficulty. The abdomen was prepped and draped in a sterile fashion. A Surgical Timeout confirmed our plan.   I made a transverse curvilinear incision through the superior umbilical fold. I placed a 70mm long port through the supraumbilical fascia using a modified Hassan cutdown technique with umbilical stalk fascial countertraction. I began carbon dioxide insufflation. No change in end tidal CO2 measurement. Camera inspection revealed no injury. There were no adhesions to the anterior abdominal wall supraumbilically. I proceeded to continue with  single site technique. I placed a #5 port in left upper aspect of the wound. I placed a 5 mm atraumatic grasper in the right inferior aspect of the wound.   I turned attention to the right upper quadrant. Gallbladder was turgid and dilated and edematous. I cannot grab it. He is a laparoscopic needle aspiration to decompress the gallbladder. Very thickened and edematous with numerous stones within it. However that allowed me to have more mobility. I freed off some adhesions between the liver and mesocolon and omentum. The gallbladder fundus was elevated cephalad. I freed adhesions to the ventral surface of the gallbladder off carefully. I freed the peritoneal coverings between the gallbladder and the liver on the posteriolateral and anteriomedial walls. I alternated between Harmonic & blunt [place] dissection to help get a good critical view of the cystic artery and cystic duct. did further dissection to free 80% of the gallbladder off the liver bed to get a good critical view of the infundibulum and cystic duct. I dissected out the cystic artery; and, after getting a good 360 view, ligated the anterior & posterior branches of the cystic artery close on the infundibulum using the Harmonic ultrasonic dissection.   I skeletonized the cystic duct. I placed a clip on the infundibulum. I did a partial  cystic duct-otomy and ensured patency. I placed a 5 [place] cholangiocatheter through a puncture site at the right subcostal ridge of the abdominal wall and directed it into the cystic duct. Cystic duct was quite edematous and foreshortened. I try to flush and adjust and milked back any stones but I could not get it to flow well with a saline flush so I held off on cholangiogram. I removed the cholangiogram catheter. I freed the gallbladder for its meaning attachments on the liver bed. I lighted the cystic duct with a 0 PDS endoloop to lasso around the entire gallbladder and coming down to the cystic duct to ligate  close to its junction with the common bile duct. Placed some clips on the short cystic duct as well. Completed cystic duct transection.    I ensured hemostasis on the gallbladder fossa of the liver and elsewhere. . I placed the gallbladder inside and ecosac I did copious irrigation to ensure clean return. Cystic duct stump with good ligation. No leaking of bile nor bleeding. I inspected the rest of the abdomen & detected no injury nor bleeding elsewhere.   I removed the gallbladder out the supraumbilical fascia. I did have to open up the fascia to 2.5 cm to get the gallbladder and its thickened wall and stones out. I closed the fascia transversely using #1 PDS interrupted stitches. I closed the skin using 4-0 monocryl stitch. Sterile dressing was applied. The patient was extubated & arrived in the PACU in stable condition..   I had discussed postoperative care with the patient in the holding area. I discussed operative findings, updated the patient's status, discussed probable steps to recovery, and gave postoperative recommendations to the patient's friend, Jenell Milliner. Recommendations were made. Questions were answered. She expressed understanding & appreciation.   Adin Hector, M.D., F.A.C.S. Gastrointestinal and Minimally Invasive Surgery [facility] [address] [place], [place] [address] [phone] Main / Paging   08/10/2020 PATHOLOGY:  Located in [place]' section of Epic EMR chart  SURGICAL PATHOLOGY  CASE: [id number]  PATIENT: Elizabeth [name]  Surgical Pathology Report   Clinical History: Acute cholecystitis (jmc)   FINAL MICROSCOPIC DIAGNOSIS:   A. GALLBLADDER, CHOLECYSTECTOMY:  - Acute and chronic cholecystitis.  - One morphologically benign lymph node.   Dashun Borre DESCRIPTION:   Specimen: Gallbladder, received fresh.  Size/?Intact: 11.5 x 4.4 x 3.4 cm, intact  Serosal surface: Pink-tan, smooth, with attached hepatic  tissue.  Mucosa/Wall: Pink-red to dark red, diffusely granular/Up to 1.2 cm  thick.  Contents: Multiple yellow-green to brown-black smooth to granular  choleliths ranging from 0.8 to 1.1 cm.  Cystic duct: Patent, with a 0.4 cm tan lymph node.  Block Summary:  Block 1-2 = gallbladder and lymph node (SW 08/11/2020)   Final Diagnosis performed by Gillie Manners, MD.   Electronically  signed 08/14/2020  Technical component performed at [facility], [place]  [address], [place], [place] [address].   Professional component performed at [employer]. [facility],  [place] [address], [place], [place] [address].   Immunohistochemistry Technical component (if applicable) was performed  at [facility]. [address], [place],  [place], [place] [address].   IMMUNOHISTOCHEMISTRY DISCLAIMER (if applicable):  Some of these immunohistochemical stains may have been developed and the  performance characteristics determine by [facility]. Some  may not have been cleared or approved by the U.S. Food and Drug  Administration. The FDA has determined that such  clearance or approval  is not necessary. This test is used for clinical purposes. It should not  be regarded as investigational or for research. This laboratory is  certified under the [place]  (CLIA-88) as qualified to perform high complexity clinical laboratory  testing.  The controls stained appropriately.   Physical Examination:   Constitutional: Not cachectic. Hygeine adequate. Vitals signs as above. Smiling. Moving easily without any guarding or hesitancy. Sits and stands without difficulty. Eyes: Normal extraocular movements. Sclera nonicteric Neuro: No major focal sensory defects. No major motor deficits. Psych: No severe agitation. No severe anxiety. Judgment & insight Adequate, Oriented x4, HENT: Normocephalic, Mucus membranes moist. No thrush.   Neck: Supple, No tracheal deviation. No obvious thyromegaly Chest: No pain to chest wall compression. Good respiratory excursion. No audible wheezing CV: Regular rhythm. No major extremity edema  Abdomen: Flat Hernia: Not present. Diastasis recti: Not present. Soft. Nondistended. Tenderness at Right mid abdomen paramedian region with vague discomfort. No sharp spike pain. No hernia with cough nor standing. No obvious umbilical hernia. Incision Clean & dry with normal healing ridge  Gen: Inguinal hernia: Not present. Inguinal lymph nodes: without lymphadenopathy.   Rectal: (Deferred)  Ext: No obvious deformity or contracture. Edema: Not present. No cyanosis Skin: Warm and dry Musculoskeletal: Severe joint rigidity not present.   Assessment and Plan:   Elizabeth [name] is a 60 y.o. [name] recovering s/p urgent single site laparoscopic cholecystectomy March 2022.  There are no diagnoses linked to this encounter.   Patient has some sharp right-sided periumbilical pain of uncertain etiology. I see no evidence of hernia or other abnormality on CT scan from May aside from scarring. No definite hernia. However, her symptoms are worse and not improved with injection. She may have a small hernia or adhesion that is causing discomfort. Lateral to rectus makes me wonder about a Spigelian-type hernia but it is upper quadrant not lower quadrant and there is no obvious beginning hernia on CT scan. I am skeptical another injection will help since the second 1 did not. Options are diagnostic laparoscopy to rule out hernia or adhesions with hernia repair if needed. Another option is to get opinion with a pain clinic to see if she can find a different pain control regimen since gabapentin is very sedating and the anti-inflammatories and ice and heat are not enough. I do not want to start her on narcotics.  She is not excited about surgery but agrees that it seems reasonable to rule out a subtle hernia since  its worse from the CAT scan 3 months ago  The anatomy & physiology of the digestive tract was discussed. The pathophysiology of intestinal obstruction was discussed. Natural history risks without surgery was discussed. I feel the patient has failed non-operative therapies. The risks of no intervention will lead to serious problems such as necrosis, perforation, dehydration, etc. that outweigh the operative risks; therefore, I recommended abdominal exploration to diagnose & treat the source of the problem. Minimally Invasive & open techniques were discussed. I expressed a good likelihood that surgery will treat the problem.  Risks such as bleeding, infection, abscess, leak, reoperation, bowel resection, possible ostomy, hernia, injury to other organs, need for repair of tissues / organs, need for further treatment, heart attack, death, and other risks were discussed. I noted a good likelihood this will help address the problem. Goals of post-operative recovery were discussed as well. We will work to minimize complications. Questions were answered. The  patient expresses understanding & wishes to proceed with surgery.  Continue Naproxen 500 mg p.o. twice daily.   Continue gabapentin as tolerated. 100 mg is about the high she can do during the daytime. She is skeptical is helping much so she wants to stop it until we get surgery. I offered to consider muscle relaxant. She does not want to do that. I hesitate to start her on narcotics at this point since its annoying but not severely debilitating. She feels comfortable continuing gabapentin at bedtime - 800mg  night and see if that helps as well.  I  No follow-ups on file.  The plan was discussed in detail with the patient today, who expressed understanding & appreciation. The patient has my contact information, and understands to call me with any additional questions or concerns in the interval. I would be happy to see the patient back sooner if the need  arises.  Adin Hector, MD, FACS, MASCRS Esophageal, Gastrointestinal & Colorectal Surgery Robotic and Minimally Invasive Surgery    Adin Hector, MD, FACS, MASCRS Esophageal, Gastrointestinal & Colorectal Surgery Robotic and Minimally Invasive Surgery  [facility], [place]  [address], [place],  [address] [phone] Fax [phone] Main  CONTACT INFORMATION:  Weekday (9AM-5PM): Call [facility] main office at [phone]  Weeknight (5PM-9AM) or Weekend/Holiday: Check [email or web address] (password " [email or web address]") for General Surgery [facility] coverage  (Please, do not use SecureChat as it is not reliable communication to operating surgeons for immediate patient care)    03/16/2021

## 2021-03-16 NOTE — Anesthesia Procedure Notes (Addendum)
Procedure Name: Intubation Date/Time: 03/16/2021 9:48 AM Performed by: Georgeanne Nim, CRNA Pre-anesthesia Checklist: Patient identified, Emergency Drugs available, Suction available, Patient being monitored and Timeout performed Patient Re-evaluated:Patient Re-evaluated prior to induction Oxygen Delivery Method: Circle system utilized Preoxygenation: Pre-oxygenation with 100% oxygen Induction Type: IV induction Ventilation: Mask ventilation without difficulty Laryngoscope Size: Mac and 4 Grade View: Grade I Tube type: Oral Tube size: 7.0 mm Number of attempts: 1 Airway Equipment and Method: Stylet Placement Confirmation: ETT inserted through vocal cords under direct vision, positive ETCO2, CO2 detector and breath sounds checked- equal and bilateral Secured at: 20 cm Tube secured with: Tape Dental Injury: Teeth and Oropharynx as per pre-operative assessment

## 2021-03-16 NOTE — Anesthesia Postprocedure Evaluation (Signed)
Anesthesia Post Note  Patient: Elizabeth [name]  Procedure(s) Performed: LAPAROSCOPY DIAGNOSTIC HERNIA REPAIR     Patient location during evaluation: PACU Anesthesia Type: General Level of consciousness: awake and alert Pain management: pain level controlled Vital Signs Assessment: post-procedure vital signs reviewed and stable Respiratory status: spontaneous breathing, nonlabored ventilation, respiratory function stable and patient connected to nasal cannula oxygen Cardiovascular status: blood pressure returned to baseline and stable Postop Assessment: no apparent nausea or vomiting Anesthetic complications: no   No notable events documented.  Last Vitals:  Vitals:   03/16/21 1200 03/16/21 1215  BP: (!) 141/81 136/84  Pulse: 78 76  Resp: (!) 22 17  Temp:  37 C  SpO2: 100% 96%    Last Pain:  Vitals:   03/16/21 1200  TempSrc:   PainSc: [place]  Brett Soza

## 2021-03-16 NOTE — Transfer of Care (Signed)
Immediate Anesthesia Transfer of Care Note  Patient: Elizabeth [name]  Procedure(s) Performed: LAPAROSCOPY DIAGNOSTIC HERNIA REPAIR  Patient Location: PACU  Anesthesia Type:General  Level of Consciousness: awake, alert , oriented and patient cooperative  Airway & Oxygen Therapy: Patient Spontanous Breathing and Patient connected to nasal cannula oxygen  Post-op Assessment: Report given to RN and Post -op Vital signs reviewed and stable  Post vital signs: Reviewed and stable  Last Vitals:  Vitals Value Taken Time  BP 141/118 03/16/21 1149  Temp 36.9 C 03/16/21 1149  Pulse 84 03/16/21 1151  Resp 19 03/16/21 1151  SpO2 99 % 03/16/21 1151  Vitals shown include unvalidated device data.  Last Pain:  Vitals:   03/16/21 0806  TempSrc: Oral  PainSc: 4       Patients Stated Pain Goal: 4 (96/04/54 0981)  Complications: No notable events documented.

## 2021-03-16 NOTE — Interval H&P Note (Signed)
History and Physical Interval Note:  03/16/2021 9:23 AM  Elizabeth [name]  has presented today for surgery, with the diagnosis of periumbilical pain possible incisional hernia.  The various methods of treatment have been discussed with the patient and family. After consideration of risks, benefits and other options for treatment, the patient has consented to  Procedure(s): LAPAROSCOPY DIAGNOSTIC (N/A) POSSIBLE HERNIA REPAIR (N/A) as a surgical intervention.  The patient's history has been reviewed, patient examined, no change in status, stable for surgery.  I have reviewed the patient's chart and labs.  Questions were answered to the patient's satisfaction.    I have re-reviewed the the patient's records, history, medications, and allergies.  I have re-examined the patient.  I again discussed intraoperative plans and goals of post-operative recovery.  The patient agrees to proceed.  Elizabeth [name]  1960-09-03 [id number]  Patient Care Team: Hali Marry, MD as PCP - General (Family Medicine) Mickel Fuchs, MD as Referring Physician (Neurology) Gustavo Lah, NP as Referring Physician (Obstetrics and Gynecology) Michael Boston, MD as Consulting Physician (General Surgery)  Patient Active Problem List   Diagnosis Date Noted   Acute on chronic cholecystitis s/p lap cholecystectomy 08/10/2020 08/09/2020   RLS (restless legs syndrome) 05/15/2011   UNSPECIFIED VITAMIN D DEFICIENCY 04/04/2010   PARESTHESIA 12/13/2009   MDD (major depressive disorder), severe ([place]) 08/17/2008   INSOMNIA 08/17/2008   ALLERGIC RHINITIS 09/16/2007   IMPAIRED FASTING GLUCOSE 09/16/2007   Anxiety state 07/15/2007    Past Medical History:  Diagnosis Date   Abdominal pain    s/p laparoscopy cholecystectomy 03/ 2022   Anxiety    Arthritis    Depression    Fibroid, uterine    History of COVID-19 04/2019   per pt moderate symptoms that resolved   RLS (restless legs syndrome)    RLS   Wears  glasses     Past Surgical History:  Procedure Laterality Date   COLONOSCOPY  02/23/2014   by dr b. Olevia Perches   HYSTEROSCOPY WITH D & C     08/ 1999 and 06-17-2002  both @[facility]    INGUINAL HERNIA REPAIR Bilateral    6 months old   [place] CHOLANGIOGRAM N/A 08/10/2020   Procedure: LAPAROSCOPIC CHOLECYSTECTOMY SINGLE SITE;  Surgeon: Michael Boston, MD;  Location: [facility];  Service: General;  Laterality: N/A;   LASER ABLATION OF THE CERVIX  1988   TEAR DUCT PROBING  1967   age 60    TONSILLECTOMY AND ADENOIDECTOMY  60   age 51     Social History   Socioeconomic History   Marital status: Divorced    Spouse name: Not on file   Number of children: 0   Years of education: Not on file   Highest education level: Not on file  Occupational History   Occupation: VETERINARIAN    Employer: [employer]  Tobacco Use   Smoking status: Never   Smokeless tobacco: Never  Vaping Use   Vaping Use: Never used  Substance and Sexual Activity   Alcohol use: Yes    Comment: Rarely.    Drug use: Never   Sexual activity: Not on file  Other Topics Concern   Not on file  Social History Narrative   Lives alone with three cats.   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    Social Determinants of Health   Financial Resource Strain: Not on file  Food Insecurity: Not on file  Transportation Needs: Not on file  Physical Activity: Not on file  Stress: Not on file  Social Connections: Not on file  Intimate Partner Violence: Not on file    Family History  Problem Relation Age of Onset   Hypertension Father    Heart disease Father    Arthritis Father    Diabetes Father    Anxiety disorder Father    Heart disease Mother        Septal defect    Anxiety disorder Mother    Cancer Brother        leiiomyoscarcoma   Anxiety disorder Brother    Hypertension Brother    Cancer Brother        giant cell tumor.     Kidney Stones Brother    Anxiety disorder Sister    Colon cancer Neg Hx     Medications Prior to Admission  Medication Sig Dispense Refill Last Dose   ALPRAZolam (XANAX) 0.25 MG tablet Take 1 tablet (0.25 mg total) by mouth at bedtime as needed for anxiety. 30 tablet 0 03/15/2021   Ascorbic Acid (VITAMIN C) 1000 MG tablet Take 1 tablet by mouth daily.   03/15/2021   busPIRone (BUSPAR) 15 MG tablet Take 15 mg by mouth 2 (two) times daily.   03/16/2021 at 0600   CAFFEINE PO Take 50 mg by mouth daily.   03/15/2021   Cholecalciferol (VITAMIN D-3 PO) Take 2,000 Units by mouth daily.   03/15/2021   Coenzyme Q10 (CO Q 10) 100 MG CAPS Take 100 mg by mouth daily.   03/15/2021   ferrous sulfate 325 (65 FE) MG tablet Take 325 mg by mouth See admin instructions. Tuesday, Thursday, Saturday and sunday   03/15/2021   gabapentin (NEURONTIN) 800 MG tablet TAKE ONE TABLET BY MOUTH AT BEDTIME 90 tablet 1 03/15/2021   Glucosamine 750 MG TABS Take 1 tablet by mouth 2 (two) times daily.   03/15/2021   loratadine (CLARITIN) 10 MG tablet Take 10 mg by mouth daily.   03/16/2021 at 0600   Multiple Vitamin (MULTIVITAMIN) tablet Take 1 tablet by mouth daily.   03/15/2021   norethindrone-ethinyl estradiol (FEMHRT 1/5) 1-5 MG-MCG TABS tablet Take 1 tablet by mouth at bedtime.   03/15/2021   Omega-3 Fatty Acids (FISH OIL) 645 MG CAPS Take by mouth.   03/12/2021   sertraline (ZOLOFT) 100 MG tablet Take 1 tablet (100 mg total) by mouth daily. (Patient taking differently: Take 100 mg by mouth at bedtime.) 30 tablet 3 03/15/2021   vitamin E 180 MG (400 UNITS) capsule Take 400 Units by mouth daily.   03/12/2021   Methylcellulose, Laxative, (CITRUCEL) 500 MG TABS Take by mouth in the morning and at bedtime. (Patient not taking: No sig reported)   Unknown    Current Facility-Administered Medications  Medication Dose Route Frequency Provider Last Rate Last Admin   bupivacaine liposome (EXPAREL) 1.3 % injection 266 mg  20 mL  Infiltration Once Michael Boston, MD       ceFAZolin (ANCEF) IVPB 2g/100 mL premix  2 g Intravenous On Call to OR Michael Boston, MD       Chlorhexidine Gluconate Cloth 2 % PADS 6 each  6 each Topical Once Michael Boston, MD       And   Chlorhexidine Gluconate Cloth 2 % PADS 6 each  6 each Topical Once Michael Boston, MD       [START ON 03/17/2021] feeding supplement (ENSURE PRE-SURGERY) liquid 296 mL  296 mL Oral  Once Michael Boston, MD       feeding supplement (ENSURE PRE-SURGERY) liquid 592 mL  592 mL Oral Once Michael Boston, MD       lactated ringers infusion   Intravenous Continuous Effie Berkshire, MD 50 mL/hr at 03/16/21 0815 New Bag at 03/16/21 0815   scopolamine (TRANSDERM-SCOP) 1 MG/3DAYS 1.5 mg  1 patch Transdermal Q72H Effie Berkshire, MD         Allergies  Allergen Reactions   Dextromethorphan-Guaifenesin Hives   Fluoxetine Hcl Rash   Wellbutrin [Bupropion] Rash    From head to toe    BP (!) 144/90   Pulse (!) 58   Temp 97.7 F (36.5 C) (Oral)   Resp 17   Ht 5\' 4"  (1.626 m)   Wt 79.5 kg   SpO2 99%   BMI 30.09 kg/m   Labs: No results found for this or any previous visit (from the past 48 hour(s)).  Imaging / Studies: No results found.   Adin Hector, M.D., F.A.C.S. Gastrointestinal and Minimally Invasive Surgery [facility] [address] [place], [place] [address] [phone] Main / Paging  03/16/2021 9:23 AM    Adin Hector

## 2021-03-16 NOTE — Op Note (Signed)
03/16/2021  PATIENT:  Elizabeth [name]  60 y.o. female  Patient Care Team: Hali Marry, MD as PCP - General (Family Medicine) Mickel Fuchs, MD as Referring Physician (Neurology) Gustavo Lah, NP as Referring Physician (Obstetrics and Gynecology) Michael Boston, MD as Consulting Physician (General Surgery)  PRE-OPERATIVE DIAGNOSIS:  periumbilical pain possible incisional hernia  POST-OPERATIVE DIAGNOSIS:   INCISIONAL HERNIA - INCARCERATED   PROCEDURE:   LAPAROSCOPIC REPAIR OF  PERIUMBILICAL INCARCERATED ABDOMINAL WALL HERNIA WITH MESH  TRANSVERSUS ABDOMINIS PLANE (TAP) BLOCK - BILATERAL   SURGEON:  Adin Hector, MD  ASSISTANT: Nurse   ANESTHESIA:     General  Regional TRANSVERSUS ABDOMINIS PLANE (TAP) nerve block for perioperative & postoperative pain control provided with liposomal bupivacaine (Experel) mixed with 0.25% bupivacaine as a Bilateral TAP block x 9mL each side at the level of the transverse abdominis & preperitoneal spaces along the flank at the anterior axillary line, from subcostal ridge to iliac crest under laparoscopic guidance   EBL:  Total I/O In: 800 [I.V.:800] Out: 50 [Blood:50]  Per anesthesia record  Delay start of Pharmacological VTE agent (>24hrs) due to surgical blood loss or risk of bleeding:  no  DRAINS: none   SPECIMEN:  No Specimen  DISPOSITION OF SPECIMEN:  N/A  COUNTS:  YES  PLAN OF CARE: Discharge to home after PACU  PATIENT DISPOSITION:  PACU - hemodynamically stable.  INDICATION: Pleasant patient has developed status post cholecystectomy with persistent periumbilical pain not resolved with nerve blocks and anti-inflammatory regimens.  I offered diagnostic laparoscopy with possible her hernia repair possible lyse adhesions  The anatomy & physiology of the abdominal wall was discussed. The pathophysiology of hernias was discussed. Natural history risks without surgery including progeressive enlargement, pain,  incarceration & strangulation was discussed. Contributors to complications such as smoking, obesity, diabetes, prior surgery, etc were discussed.  I feel the risks of no intervention will lead to serious problems that outweigh the operative risks; therefore, I recommended surgery to reduce and repair the hernia. I explained laparoscopic techniques with possible need for an open approach. I noted the probable use of mesh to patch and/or buttress the hernia repair.  Risks such as bleeding, infection, abscess, need for further treatment, heart attack, death, and other risks were discussed. I noted a good likelihood this will help address the problem. Goals of post-operative recovery were discussed as well. Possibility that this will not correct all symptoms was explained. I stressed the importance of low-impact activity, aggressive pain control, avoiding constipation, & not pushing through pain to minimize risk of post-operative chronic pain or injury.  Possibility of no major abnormalities was discussed possibility of reherniation especially with smoking, obesity, diabetes, immunosuppression, and other health conditions was discussed. We will work to minimize complications.   An educational handout further explaining the pathology & treatment options was given as well. Questions were answered. The patient expresses understanding & wishes to proceed with surgery.   OR FINDINGS: 5 x 2 cm Swiss cheese hernias including umbilical hernia.  Supraumbilical 2 spots incarcerated with some falciform ligament adipose tissue.  No intra-abdominal adhesions.  Type of repair: Laparoscopic underlay repair.  Primary repair of largest hernia at umbilicus   Placement of mesh: Centrally intraperitoneal with edges tucked into RECTRORECTUS & preperitoneal space  Name of mesh: Bard Ventralight dual sided (polypropylene / Seprafilm)  Size of mesh: 20x15cm  Orientation: Transverse  Mesh overlap:  5-7cm   DESCRIPTION:    Informed consent was confirmed. The patient underwent  general anaesthesia without difficulty. The patient was positioned appropriately. VTE prevention in place. The patient's abdomen was clipped, prepped, & draped in a sterile fashion. Surgical timeout confirmed our plan.  The patient was positioned in reverse Trendelenburg. Abdominal entry was gained using optical entry technique in the left upper abdomen. Entry was clean. I induced carbon dioxide insufflation. Camera inspection revealed no injury. Extra ports were carefully placed under direct laparoscopic visualization.   I could see the parietal peritoneum under the abdominal wall.  There were no adhesions to the anterior abdominal wall.  However the falciform ligament did seem somewhat dividend and dimpled suspicious for small herniations periumbilically.  I freed off the falciform ligament and central peritoneum to expose the retrorectus fascia  With this I reduced 3 areas of falciform ligament fat incarcerated Swiss cheeselike hernias within the diastases recti supraumbilically and at the umbilicus.  I made sure hemostasis was good.  I detected no masses or tumors or other areas concern.  Small bowel, omentum, colon, and other intraperitoneal organs appeared normal.  Because it was a region of hernias I did not feel doing suture repair only would work and therefore decided to patch the region with mesh.  I mapped out the region using a needle passer.   To ensure that I would have at least 5 cm radial coverage outside of the hernia defect, I chose a 20x15cm dual sided mesh that was immediately available.  I placed #1 Prolene stitches around its edge about every 5 cm = 12 total.  I rolled the mesh & placed into the peritoneal cavity through the umbilical hernia defect.  I unrolled the mesh and positioned it appropriately.  I secured the mesh to cover up the hernia defect using a laparoscopic suture passer to pass the tails of the Prolene through the  abdominal wall & tagged them with clamps for good transfascial suturing.  I started out in four corners to make sure I had the mesh centered under the hernia defect appropriately, and then proceeded to work in quadrants.    We evacuated CO2 & desufflated the abdomen.  I tied the fascial stitches down. I closed the local hernia fascial defect that I placed the mesh through using 0 Vicryl interrupted transverse stitches primarily.  I reinsufflated the abdomen. The mesh provided at least circumferential coverage around the entire region of hernia defects.  I secured the mesh centrally with an additional trans fascial stitch in & out the mesh using #1 PDS under laparoscopic visualization.   I tacked the edges & central part of the mesh to the peritoneum/posterior rectus fascia with SecureStrap absorbable tacks.   I did reinspection. Hemostasis was good. Mesh laid well. I completed a broad field block of local anesthesia at fascial stitch sites & fascial closure areas.    Capnoperitoneum was evacuated. Ports were removed. The skin was closed with Monocryl at the port sites and Steri-Strips on the fascial stitch puncture sites.  Patient is being extubated to go to the recovery room.  I discussed operative findings, updated the patient's status, discussed probable steps to recovery, and gave postoperative recommendations to the patient's friend.  Recommendations were made.  Questions were answered.  She expressed understanding & appreciation.  Adin Hector, M.D., F.A.C.S. Gastrointestinal and Minimally Invasive Surgery [facility] [address] [place], [place] [address] [phone] Main / Paging  03/16/2021 11:37 AM

## 2021-03-16 NOTE — Anesthesia Preprocedure Evaluation (Addendum)
Anesthesia Evaluation  Patient identified by MRN, date of birth, ID band Patient awake    Reviewed: Allergy & Precautions, NPO status , Patient's Chart, lab work & pertinent test results  Airway Mallampati: I  TM Distance: >3 FB Neck ROM: Full    Dental  (+) Teeth Intact, Dental Advisory Given   Pulmonary neg pulmonary ROS,    breath sounds clear to auscultation       Cardiovascular negative cardio ROS   Rhythm:Regular Rate:Normal     Neuro/Psych PSYCHIATRIC DISORDERS Anxiety Depression negative neurological ROS     GI/Hepatic negative GI ROS, Neg liver ROS,   Endo/Other  negative endocrine ROS  Renal/GU negative Renal ROS     Musculoskeletal  (+) Arthritis ,   Abdominal Normal abdominal exam  (+)   Peds  Hematology negative hematology ROS (+)   Anesthesia Other Findings   Reproductive/Obstetrics                            Anesthesia Physical Anesthesia Plan  ASA: 2  Anesthesia Plan: General   Post-op Pain Management:    Induction: Intravenous  PONV Risk Score and Plan: 4 or greater and Ondansetron, Dexamethasone, Midazolam and Scopolamine patch - Pre-op  Airway Management Planned: Oral ETT  Additional Equipment: None  Intra-op Plan:   Post-operative Plan: Extubation in OR  Informed Consent: I have reviewed the patients History and Physical, chart, labs and discussed the procedure including the risks, benefits and alternatives for the proposed anesthesia with the patient or authorized representative who has indicated his/her understanding and acceptance.     Dental advisory given  Plan Discussed with: CRNA  Anesthesia Plan Comments:        Anesthesia Quick Evaluation

## 2021-03-19 ENCOUNTER — Encounter (HOSPITAL_BASED_OUTPATIENT_CLINIC_OR_DEPARTMENT_OTHER): Payer: Self-pay | Admitting: Surgery

## 2021-03-28 ENCOUNTER — Ambulatory Visit (INDEPENDENT_AMBULATORY_CARE_PROVIDER_SITE_OTHER): Payer: 59 | Admitting: Professional

## 2021-03-28 DIAGNOSIS — F331 Major depressive disorder, recurrent, moderate: Secondary | ICD-10-CM

## 2021-03-28 DIAGNOSIS — F411 Generalized anxiety disorder: Secondary | ICD-10-CM

## 2021-04-11 ENCOUNTER — Other Ambulatory Visit: Payer: Self-pay | Admitting: Family Medicine

## 2021-04-11 ENCOUNTER — Ambulatory Visit (INDEPENDENT_AMBULATORY_CARE_PROVIDER_SITE_OTHER): Payer: 59 | Admitting: Professional

## 2021-04-11 DIAGNOSIS — G2581 Restless legs syndrome: Secondary | ICD-10-CM

## 2021-04-11 DIAGNOSIS — F411 Generalized anxiety disorder: Secondary | ICD-10-CM | POA: Diagnosis not present

## 2021-04-11 DIAGNOSIS — F331 Major depressive disorder, recurrent, moderate: Secondary | ICD-10-CM

## 2021-04-25 ENCOUNTER — Ambulatory Visit (INDEPENDENT_AMBULATORY_CARE_PROVIDER_SITE_OTHER): Payer: 59 | Admitting: Behavioral Health

## 2021-04-25 ENCOUNTER — Ambulatory Visit: Payer: 59 | Admitting: Professional

## 2021-04-25 ENCOUNTER — Other Ambulatory Visit: Payer: Self-pay

## 2021-04-25 ENCOUNTER — Encounter: Payer: Self-pay | Admitting: Behavioral Health

## 2021-04-25 DIAGNOSIS — F331 Major depressive disorder, recurrent, moderate: Secondary | ICD-10-CM

## 2021-04-25 DIAGNOSIS — F411 Generalized anxiety disorder: Secondary | ICD-10-CM

## 2021-04-25 MED ORDER — SERTRALINE HCL 100 MG PO TABS: 100.0000 mg | ORAL_TABLET | Freq: Every day | ORAL | 3 refills | Status: DC

## 2021-04-25 MED ORDER — BUSPIRONE HCL 15 MG PO TABS: 15.0000 mg | ORAL_TABLET | Freq: Two times a day (BID) | ORAL | 3 refills | Status: DC

## 2021-04-25 NOTE — Progress Notes (Signed)
[facility]  Patient ID: Elizabeth [name],  MRN: [id number]  PCP: Hali Marry, MD  Date of Evaluation: 04/25/2021 Time spent:30 minutes  Chief Complaint:  Chief Complaint   Anxiety; Depression; Follow-up; Medication Refill; Panic Attack     HISTORY/CURRENT STATUS: HPI 60 year old female presents to this office for follow up and medication management. She says that she is feeling very well right now. She feels stable with decreased anxiety and depression. She just had surgery 6 weeks ago to repair three hernias. She reports decreased anxiety at 2/10 and depression at 2/10. She is sleeping 6- 7  hours per night. 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 No mania, no psychosis, no SI/HI. Past Psychiatric medication failures: Prozac Paxil Cymbalta Effexor Wellbutrin    Individual Medical History/ Review of Systems: Changes? :No   Allergies: Dextromethorphan-guaifenesin, Fluoxetine hcl, and Wellbutrin [bupropion]  Current Medications:  Current Outpatient Medications:    ALPRAZolam (XANAX) 0.25 MG tablet, Take 1 tablet (0.25 mg total) by mouth at bedtime as needed for anxiety., Disp: 30 tablet, Rfl: 0   Ascorbic Acid (VITAMIN C) 1000 MG tablet, Take 1 tablet by mouth daily., Disp: , Rfl:    busPIRone (BUSPAR) 15 MG tablet, Take 1 tablet (15 mg total) by mouth 2 (two) times daily., Disp: 60 tablet, Rfl: 3   CAFFEINE PO, Take 50 mg by mouth daily., Disp: , Rfl:    Cholecalciferol (VITAMIN D-3 PO), Take 2,000 Units by mouth daily., Disp: , Rfl:    Coenzyme Q10 (CO Q 10) 100 MG CAPS, Take 100 mg by mouth daily., Disp: , Rfl:    ferrous sulfate 325 (65 FE) MG tablet, Take 325 mg by mouth See admin instructions. Tuesday, Thursday, Saturday and sunday, Disp: , Rfl:    gabapentin (NEURONTIN) 800 MG tablet, TAKE ONE TABLET BY MOUTH EVERY NIGHT AT BEDTIME,  Disp: 90 tablet, Rfl: 1   Glucosamine 750 MG TABS, Take 1 tablet by mouth 2 (two) times daily., Disp: , Rfl:    loratadine (CLARITIN) 10 MG tablet, Take 10 mg by mouth daily., Disp: , Rfl:    methocarbamol (ROBAXIN) 750 MG tablet, Take 1 tablet (750 mg total) by mouth 4 (four) times daily as needed (use for muscle cramps/pain)., Disp: 30 tablet, Rfl: 2   Methylcellulose, Laxative, (CITRUCEL) 500 MG TABS, Take by mouth in the morning and at bedtime. (Patient not taking: No sig reported), Disp: , Rfl:    Multiple Vitamin (MULTIVITAMIN) tablet, Take 1 tablet by mouth daily., Disp: , Rfl:    norethindrone-ethinyl estradiol (FEMHRT 1/5) 1-5 MG-MCG TABS tablet, Take 1 tablet by mouth at bedtime., Disp: , Rfl:    Omega-3 Fatty Acids (FISH OIL) 645 MG CAPS, Take by mouth., Disp: , Rfl:    oxyCODONE (OXY IR/ROXICODONE) 5 MG immediate release tablet, Take 1-2 tablets (5-10 mg total) by mouth every 6 (six) hours as needed for moderate pain, severe pain or breakthrough pain., Disp: 20 tablet, Rfl: 0   sertraline (ZOLOFT) 100 MG tablet, Take 1 tablet (100 mg total) by mouth daily., Disp: 30 tablet, Rfl: 3   vitamin E 180 MG (400 UNITS) capsule, Take 400 Units by mouth daily., Disp: , Rfl:  Medication Side Effects: none  Family Medical/ Social History: Changes? No  MENTAL HEALTH EXAM:  There were no vitals taken for this visit.There is no height  or weight on file to calculate BMI.  General Appearance: Casual and Neat  Eye Contact:  Good  Speech:  Clear and Coherent  Volume:  Normal  Mood:  NA  Affect:  Appropriate  Thought Process:  Coherent  Orientation:  Full (Time, Place, and Person)  Thought Content: Logical   Suicidal Thoughts:  No  Homicidal Thoughts:  No  Memory:  WNL  Judgement:  Good  Insight:  Good  Psychomotor Activity:  Normal  Concentration:  Concentration: Good  Recall:  Good  Fund of Knowledge: Good  Language: Good  Assets:  Desire for Improvement  ADL's:  Intact  Cognition:  WNL  Prognosis:  Good    DIAGNOSES:    ICD-10-CM   1. Generalized anxiety disorder  F41.1 sertraline (ZOLOFT) 100 MG tablet    2. Major depressive disorder, recurrent episode, moderate (HCC)  F33.1 sertraline (ZOLOFT) 100 MG tablet      Receiving Psychotherapy: Yes [name]   RECOMMENDATIONS:  Continue  Zoloft 100 mg daily Continue Buspar 15 mg twice daily Will report side affects or worsening symptoms promptly Will follow up in 4 months to reassess Greater than 50% of 30 min face to face time with patient was spent on counseling and coordination of care.  We discussed her current stability  with  anxiety and depression. Patient agreed to DC zoloft if rash develops. She will continue psychotherapy. Emergency contact and after hours information provided. Patient verbally contracted for safety. Refills escribed to patient pharmacy.        Elizabeth Brooklyn, NP

## 2021-05-02 LAB — HM MAMMOGRAPHY

## 2021-05-03 ENCOUNTER — Encounter: Payer: Self-pay | Admitting: Family Medicine

## 2021-05-09 ENCOUNTER — Ambulatory Visit (INDEPENDENT_AMBULATORY_CARE_PROVIDER_SITE_OTHER): Payer: 59 | Admitting: Professional

## 2021-05-09 ENCOUNTER — Encounter: Payer: Self-pay | Admitting: Professional

## 2021-05-09 DIAGNOSIS — F411 Generalized anxiety disorder: Secondary | ICD-10-CM | POA: Diagnosis not present

## 2021-05-09 DIAGNOSIS — F331 Major depressive disorder, recurrent, moderate: Secondary | ICD-10-CM | POA: Diagnosis not present

## 2021-05-09 NOTE — Progress Notes (Signed)
[facility] Counselor/Therapist Progress Note  Patient ID: Elizabeth [name], MRN: [id number],    Date: 05/09/2021  Time Spent: 66minutes 302-352 pm  Treatment Type: Individual Therapy  Reported Symptoms: content,   Mental Status Exam: Appearance:  Neat     Behavior: Appropriate and Sharing  Motor: Normal  Speech/Language:  Clear and Coherent and Normal Rate  Affect: Full Range  Mood: normal  Thought process: normal  Thought content:   WNL  Sensory/Perceptual disturbances:   WNL  Orientation: oriented to person, place, time/date, and situation  Attention: Good  Concentration: Good  Memory: WNL  Fund of knowledge:  Good  Insight:   Good  Judgment:  Good  Impulse Control: Good   Risk Assessment: Danger to Self:  No Self-injurious Behavior: No Danger to Others: No Duty to Warn:no Physical Aggression / Violence:No  Access to Firearms a concern: No  Gang Involvement:No   Subjective: This session was held via video teletherapy due to the coronavirus risk at this time. The patient consented to video teletherapy and was located at her home during this session. She is aware it is the responsibility of the patient to secure confidentiality on her end of the session. The provider was in a private home office for the duration of this session.   The patient arrived on time for her webex appointment appearing nicely groomed and easily engaged.  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 -not working Christmas Eve and plans to go to church -has received a card from an aunt, a niece in [place], and a nephew   -niece a "germophobe" and during Covid she wanted no contact with anyone     -niece did call to check on pt when sick with Covid 5-aligned living -thinks most about Ronalee Belts and their marriage   -not as hard on herself   -it is not incurring as much pain as it once did   -she does not think he intentionally engaged an affair but that he was bored and jealous of her commitment to school -people don't understand you -how aggravating people are    Interventions: Assertiveness/Communication and Solution-Oriented/Positive Psychology  Diagnosis:Major depressive disorder, recurrent episode, moderate ([place])  Generalized anxiety disorder  Plan:  -meet again on Wednesday, June 06, 2021 at 12 pm  [place], [facility]

## 2021-06-06 ENCOUNTER — Ambulatory Visit (INDEPENDENT_AMBULATORY_CARE_PROVIDER_SITE_OTHER): Payer: 59 | Admitting: Professional

## 2021-06-06 ENCOUNTER — Encounter: Payer: Self-pay | Admitting: Professional

## 2021-06-06 DIAGNOSIS — F411 Generalized anxiety disorder: Secondary | ICD-10-CM

## 2021-06-06 DIAGNOSIS — R69 Illness, unspecified: Secondary | ICD-10-CM | POA: Diagnosis not present

## 2021-06-06 DIAGNOSIS — F331 Major depressive disorder, recurrent, moderate: Secondary | ICD-10-CM | POA: Diagnosis not present

## 2021-06-06 NOTE — Progress Notes (Signed)
[facility] Counselor/Therapist Progress Note  Patient ID: NYAH SHEPHERD, MRN: [id number],    Date: 06/06/2021  Time Spent: 50 minutes 04-1249 pm  Treatment Type: Individual Therapy  Reported Symptoms: mood-no depressed, mild pain related to physical health  Mental Status Exam: Appearance:  casual  Behavior: Appropriate and Sharing  Motor: Normal  Speech/Language:  Clear and Coherent and Normal Rate  Affect: Full Range  Mood: normal  Thought process: goal directed  Thought content:   WNL  Sensory/Perceptual disturbances:   WNL  Orientation: oriented to person, place, time/date, and situation  Attention: Good  Concentration: Good  Memory: WNL  Fund of knowledge:  Good  Insight:   Good  Judgment:  Good  Impulse Control: Good   Risk Assessment: Danger to Self:  No Self-injurious Behavior: No Danger to Others: No Duty to Warn:no Physical Aggression / Violence:No  Access to Firearms a concern: No  Gang Involvement:No   Subjective: This session was held via video teletherapy due to the coronavirus risk at this time. The patient consented to video teletherapy and was located at her home during this session. She is aware it is the responsibility of the patient to secure confidentiality on her end of the session. The provider was in a private home office for the duration of this session.   Issues addressed: 1- medical -having pain that is not as painful as before surgery -she reports she is hopeful it will get better as she increases her activity level -she will consult her MD once she is 90 days out -still trying to be somewhat careful in regard to lifting and how she does it   -she has minimal lifting in general practice   -rehab she is by herself and intentionally minimizes her lifting 2-mood -pretty good -having issues with concentration   -feels like the Covid fog she had before     -she has noticed as a symptom of depression but her mood is  fine -mood generally is problematic and then concentration 3-journaling -[employer] journal -patient wants to write but needs some structure 4-church -went to her church for the first time in a long time -she got a lot out of it and thinks it was useful 5-reviewed measurable objectives -patient showed improvement in all areas since last review in November 2022 -moving forward targeted areas will be on problem solving, conflict resolution, and self esteem  Problems Addressed  Low Self-Esteem, Unipolar Depression Goals 1. Alleviate depressive symptoms and return to previous level of effective functioning. 2. Appropriately grieve the loss in order to normalize mood and to return to previously adaptive level of functioning. Objective Increasingly verbalize hopeful and positive statements regarding self, others, and the future. Target Date: 2021-09-19 Frequency: monthly  Progress: 80 Modality: individual  Objective Verbalize insight into how past relationships may be influencing current experiences with depression. Target Date: 2021-09-19 Frequency: monthly  Progress: 80 Modality: individual  Related Interventions Explain a connection between previously unexpressed (repressed) feelings of anger (and helplessness) and current state of depression. Encourage the client to share feelings of anger regarding pain inflicted on him/her in childhood that contributed to current depressed state. Explore experiences from the client's childhood that contribute to current depressed state. Conduct Brief Psychodynamic Therapy for depression to help the client increase insight into the role that past relational patterns may be influencing current vulnerabilities to depression; identify core conflictual themes; process with the client toward making changes in current relational patterns (see Supportive-Expressive Dynamic Psychotherapy of Depression  by Delrae Alfred al.). Objective Verbalize an  understanding of healthy and unhealthy emotions with the intent of increasing the use of healthy emotions to guide actions. Target Date: 2021-09-19 Frequency: monthly  Progress: 80 Modality: individual  Related Interventions Use a process-experiential approach consistent with Emotion-Focused Therapy to create a safe, nurturing environment in which the client can process emotions, learning to identify and regulate unhealthy feelings and to generate more adaptive ones that then guide actions (see Emotion-Focused Therapy for Depression by Andrey Spearman). Objective Learn and implement relapse prevention skills. Target Date: 2021-09-19 Frequency: monthly  Progress: 59 Modality: individual  Related Interventions Build the client's relapse prevention skills by helping him/her identify early warning signs of relapse and rehearsing the use of skills learned during therapy to manage them. Identify and rehearse with the client the management of future situations or circumstances in which lapses could occur. Discuss with the client the distinction between a lapse and relapse, associating a lapse with a rather common, temporary setback that may involve, for example, re-experiencing a depressive thought and/or urge to withdraw or avoid (perhaps as related to some loss or conflict) and a relapse as a sustained return to a pattern of depressive thinking and feeling usually accompanied by interpersonal withdrawal and/or avoidance. Objective Learn and implement behavioral strategies to overcome depression. Target Date: 2021-09-19 Frequency: monthly  Progress: 90 Modality: individual  Related Interventions Assist the client in developing skills that increase the likelihood of deriving pleasure from behavioral activation (e.g., assertiveness skills, developing an exercise plan, less internal/more external focus, increased social involvement); reinforce success. Objective Identify and replace thoughts and  beliefs that support depression. Target Date: 2021-09-19 Frequency: monthly  Progress: 90 Modality: individual  Related Interventions Explore and restructure underlying assumptions and beliefs reflected in biased self-talk that may put the client at risk for relapse or recurrence. Facilitate and reinforce the client's shift from biased depressive self-talk and beliefs to reality-based cognitive messages that enhance self-confidence and increase adaptive actions (see "Positive Self-Talk" in the Adult Psychotherapy Homework Planner by Bryn Gulling). Assign "behavioral experiments" in which depressive automatic thoughts are treated as hypotheses/prediction, reality-based alternative hypotheses/prediction are generated, and both are tested against the client's past, present, and/or future experiences. Assign the client to self-monitor thoughts, feelings, and actions in daily journal (e.g., "Negative Thoughts Trigger Negative Feelings" in the Adult Psychotherapy Homework Planner by Bryn Gulling; "Daily Record of Dysfunctional Thoughts" in Cognitive Therapy of Depression by Lupita Shutter and Warren Lacy); process the journal material to challenge depressive thinking patterns and replace them with reality-based thoughts. Conduct Cognitive-Behavioral Therapy (see Cognitive Behavior Therapy by Olevia Bowens; Overcoming Depression by Lynita Lombard al.), beginning with helping the client learn the connection among cognition, depressive feelings, and actions. 3. Develop a consistent, positive self-image. 4. Develop healthy thinking patterns and beliefs about self, others, and the world that lead to the alleviation and help prevent the relapse of depression. 5. Develop the necessary skills for effective, open communication, mutually satisfying sexual intimacy, and enjoyable time for companionship within the relationship. 6. Elevate self-esteem. 7. Increase awareness of own role in the relationship conflicts. Objective Learn and implement  problem-solving and conflict resolution skills. Target Date: 2021-09-19 Frequency: monthly  Progress: 51 Modality: individual  Related Interventions Assign the couple a homework exercise to use and record newly learned problem-solving and conflict resolution skills (or assign "Applying Problem-Solving to Interpersonal Conflict" in the Adult Psychotherapy Homework Planner by [facility]); process results in session. Use behavioral techniques (education, modeling, role-playing, corrective feedback, and positive  reinforcement) to teach the couple problem-solving and conflict resolution skills including defining the problem constructively and specifically, brainstorming options, evaluating options, compromise, choosing options and implementing a plan, evaluating the results. Objective Implement increased socialization activities to cope with loneliness. Target Date: 2021-09-19 Frequency: monthly  Progress: 45 Modality: individual  Related Interventions Inform the client of opportunities within the community that assist him/her in building new social relationships. Objective Gain insight into how past relationship experiences influence current relationship problems. Target Date: 2021-09-19 Frequency: monthly  Progress: 80 Modality: individual  8. Increase job satisfaction and performance due to implementation of assertiveness and stress management strategies. Objective Identify the effect that vocational stress has on feelings toward self and relationships with significant others. Target Date: 2021-09-19 Frequency: monthly  Progress: 90 Modality: individual  Related Interventions Explore the effect of the client's vocational stress on his/her intra- and interpersonal dynamics with friends and family. Objective Identify and replace distorted cognitive messages associated with feelings of job stress. Target Date: 2021-09-19 Frequency: monthly  Progress: 80 Modality: individual  Related  Interventions Probe and clarify the client's emotions surrounding his/her vocational stress. Assess the client's distorted cognitive messages and schema that foster his/her vocational stress; replace these messages with positive cognitions (or assign "Negative Thoughts Trigger Negative Feelings" in the Adult Psychotherapy Homework Planner by Bryn Gulling). Confront the client's pattern of catastrophizing situations leading to immobilizing anxiety; replace these messages with realistic thoughts. Objective Learn and implement calming skills to reduce overall anxiety and manage anxiety symptoms. Target Date: 2021-09-19 Frequency: monthly  Progress: 90 Modality: individual  9. Interact socially without undue distress or disability. Objective Form realistic, appropriate, and attainable goals for self in all areas of life. Target Date: 2021-09-19 Frequency: monthly  Progress: 40 Modality: individual  Related Interventions Assign the client to make a list of goals for various areas of life and a plan for steps toward goal attainment. Help the client analyze his/her goals to make sure they are realistic and attainable. Objective Identify and engage in activities that would improve self-image by being consistent with one's values. Target Date: 2021-09-19 Frequency: monthly  Progress: 90 Modality: individual  Related Interventions Identify and assign activities congruent with the client's values; process them toward improving self-concept and self-esteem. Help the client analyze his/her values and the congruence or incongruence between them and the client's daily activities. Objective Increase insight into the historical and current sources of low self-esteem. Target Date: 2021-09-19 Frequency: monthly  Progress: 64 Modality: individual  Related Interventions Discuss, emphasize, and interpret the client's incidents of abuse (emotional, physical, and sexual) and how they have impacted his/her feelings  about himself/herself. Help the client become aware of his/her fear of rejection and its connection with past rejection or abandonment experiences; begin to contrast past experiences of pain with present experiences of acceptance and competence.  Diagnosis:Major depressive disorder, recurrent episode, moderate (HCC)  Generalized anxiety disorder  Plan:  -meet again on Wednesday, July 04, 2021 at [place], [facility]

## 2021-07-04 ENCOUNTER — Ambulatory Visit (INDEPENDENT_AMBULATORY_CARE_PROVIDER_SITE_OTHER): Payer: 59 | Admitting: Professional

## 2021-07-04 ENCOUNTER — Encounter: Payer: Self-pay | Admitting: Professional

## 2021-07-04 DIAGNOSIS — F331 Major depressive disorder, recurrent, moderate: Secondary | ICD-10-CM

## 2021-07-04 DIAGNOSIS — F411 Generalized anxiety disorder: Secondary | ICD-10-CM | POA: Diagnosis not present

## 2021-07-04 DIAGNOSIS — R69 Illness, unspecified: Secondary | ICD-10-CM | POA: Diagnosis not present
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
## 2021-08-01 ENCOUNTER — Encounter: Payer: Self-pay | Admitting: Professional

## 2021-08-01 ENCOUNTER — Ambulatory Visit (INDEPENDENT_AMBULATORY_CARE_PROVIDER_SITE_OTHER): Payer: 59 | Admitting: Professional

## 2021-08-01 DIAGNOSIS — F331 Major depressive disorder, recurrent, moderate: Secondary | ICD-10-CM

## 2021-08-01 DIAGNOSIS — F411 Generalized anxiety disorder: Secondary | ICD-10-CM

## 2021-08-01 DIAGNOSIS — R69 Illness, unspecified: Secondary | ICD-10-CM | POA: Diagnosis not present

## 2021-08-01 NOTE — Progress Notes (Signed)
[facility] Counselor/Therapist Progress Note  Patient ID: Elizabeth [name], MRN: [id number],    Date: 08/01/2021  Time Spent: 54 minutes [id number] pm  Treatment Type: Individual Therapy  Reported Symptoms: mood-not depressed, mild pain related to physical health  Mental Status Exam: Appearance:  casual  Behavior: Appropriate and Sharing  Motor: Normal  Speech/Language:  Clear and Coherent and Normal Rate  Affect: Full Range  Mood: normal  Thought process: goal directed  Thought content:   WNL  Sensory/Perceptual disturbances:   WNL  Orientation: oriented to person, place, time/date, and situation  Attention: Good  Concentration: Good  Memory: WNL  Fund of knowledge:  Good  Insight:   Good  Judgment:  Good  Impulse Control: Good   Risk Assessment: Danger to Self:  No Self-injurious Behavior: No Danger to Others: No Duty to Warn:no Physical Aggression / Violence:No  Access to Firearms a concern: No  Gang Involvement:No   Subjective: This session was held via video teletherapy due to the coronavirus risk at this time. The patient consented to video teletherapy and was located at her home during this session. She is aware it is the responsibility of the patient to secure confidentiality on her end of the session. The provider was in a private home office for the duration of this session.   The patient arrived on time for her webex session.  Issues addressed: 1- family -brother had kidney stones removed today -pt got text after the fact -pt would have appreciated so she could pray for him   -he is agnostic but she would have preferred to know in advance 2-self-care -she has started her self-love book -has 13 weeks -this week was self-compassion   -she is enjoying so far -she has the [employer] book and has read but is going to read again -her friend has provided her entry into a seminar this weekend -she went walking with her friend Juliann Pulse and is  unsure that she is a safe person   -she tells her what she needs to do  -bordering on intrusive 3-mood -pretty good -most days her mood is very good -she does not want to be unfeeling -she can recognize that she can have ups and downs -she is smiling more and her eyes sparkle more -she identifies as happy and says it is surprising 4-self-esteem -where does low self-esteem come from -how to change  Problems Addressed  Low Self-Esteem, Unipolar Depression Goals 1. Alleviate depressive symptoms and return to previous level of effective functioning. 2. Appropriately grieve the loss in order to normalize mood and to return to previously adaptive level of functioning. Objective Increasingly verbalize hopeful and positive statements regarding self, others, and the future. Target Date: 2021-09-19 Frequency: monthly  Progress: 80 Modality: individual  Objective Verbalize insight into how past relationships may be influencing current experiences with depression. Target Date: 2021-09-19 Frequency: monthly  Progress: 80 Modality: individual  Related Interventions Explain a connection between previously unexpressed (repressed) feelings of anger (and helplessness) and current state of depression. Encourage the client to share feelings of anger regarding pain inflicted on him/her in childhood that contributed to current depressed state. Explore experiences from the client's childhood that contribute to current depressed state. Conduct Brief Psychodynamic Therapy for depression to help the client increase insight into the role that past relational patterns may be influencing current vulnerabilities to depression; identify core conflictual themes; process with the client toward making changes in current relational patterns (see Supportive-Expressive Dynamic Psychotherapy of Depression by  Laurence Spates et al.). Objective Verbalize an understanding of healthy and unhealthy emotions with the intent of  increasing the use of healthy emotions to guide actions. Target Date: 2021-09-19 Frequency: monthly  Progress: 80 Modality: individual  Related Interventions Use a process-experiential approach consistent with Emotion-Focused Therapy to create a safe, nurturing environment in which the client can process emotions, learning to identify and regulate unhealthy feelings and to generate more adaptive ones that then guide actions (see Emotion-Focused Therapy for Depression by Andrey Spearman). Objective Learn and implement relapse prevention skills. Target Date: 2021-09-19 Frequency: monthly  Progress: 45 Modality: individual  Related Interventions Build the client's relapse prevention skills by helping him/her identify early warning signs of relapse and rehearsing the use of skills learned during therapy to manage them. Identify and rehearse with the client the management of future situations or circumstances in which lapses could occur. Discuss with the client the distinction between a lapse and relapse, associating a lapse with a rather common, temporary setback that may involve, for example, re-experiencing a depressive thought and/or urge to withdraw or avoid (perhaps as related to some loss or conflict) and a relapse as a sustained return to a pattern of depressive thinking and feeling usually accompanied by interpersonal withdrawal and/or avoidance. Objective Learn and implement behavioral strategies to overcome depression. Target Date: 2021-09-19 Frequency: monthly  Progress: 90 Modality: individual  Related Interventions Assist the client in developing skills that increase the likelihood of deriving pleasure from behavioral activation (e.g., assertiveness skills, developing an exercise plan, less internal/more external focus, increased social involvement); reinforce success. Objective Identify and replace thoughts and beliefs that support depression. Target Date: 2021-09-19 Frequency:  monthly  Progress: 90 Modality: individual  Related Interventions Explore and restructure underlying assumptions and beliefs reflected in biased self-talk that may put the client at risk for relapse or recurrence. Facilitate and reinforce the client's shift from biased depressive self-talk and beliefs to reality-based cognitive messages that enhance self-confidence and increase adaptive actions (see "Positive Self-Talk" in the Adult Psychotherapy Homework Planner by Bryn Gulling). Assign "behavioral experiments" in which depressive automatic thoughts are treated as hypotheses/prediction, reality-based alternative hypotheses/prediction are generated, and both are tested against the client's past, present, and/or future experiences. Assign the client to self-monitor thoughts, feelings, and actions in daily journal (e.g., "Negative Thoughts Trigger Negative Feelings" in the Adult Psychotherapy Homework Planner by Bryn Gulling; "Daily Record of Dysfunctional Thoughts" in Cognitive Therapy of Depression by Lupita Shutter and Warren Lacy); process the journal material to challenge depressive thinking patterns and replace them with reality-based thoughts. Conduct Cognitive-Behavioral Therapy (see Cognitive Behavior Therapy by Olevia Bowens; Overcoming Depression by Lynita Lombard al.), beginning with helping the client learn the connection among cognition, depressive feelings, and actions. 3. Develop a consistent, positive self-image. 4. Develop healthy thinking patterns and beliefs about self, others, and the world that lead to the alleviation and help prevent the relapse of depression. 5. Develop the necessary skills for effective, open communication, mutually satisfying sexual intimacy, and enjoyable time for companionship within the relationship. 6. Elevate self-esteem. 7. Increase awareness of own role in the relationship conflicts. Objective Learn and implement problem-solving and conflict resolution skills. Target Date:  2021-09-19 Frequency: monthly  Progress: 42 Modality: individual  Related Interventions Assign the couple a homework exercise to use and record newly learned problem-solving and conflict resolution skills (or assign "Applying Problem-Solving to Interpersonal Conflict" in the Adult Psychotherapy Homework Planner by [facility]); process results in session. Use behavioral techniques (education, modeling, role-playing, corrective feedback, and positive reinforcement)  to teach the couple problem-solving and conflict resolution skills including defining the problem constructively and specifically, brainstorming options, evaluating options, compromise, choosing options and implementing a plan, evaluating the results. Objective Implement increased socialization activities to cope with loneliness. Target Date: 2021-09-19 Frequency: monthly  Progress: 26 Modality: individual  Related Interventions Inform the client of opportunities within the community that assist him/her in building new social relationships. Objective Gain insight into how past relationship experiences influence current relationship problems. Target Date: 2021-09-19 Frequency: monthly  Progress: 80 Modality: individual  8. Increase job satisfaction and performance due to implementation of assertiveness and stress management strategies. Objective Identify the effect that vocational stress has on feelings toward self and relationships with significant others. Target Date: 2021-09-19 Frequency: monthly  Progress: 90 Modality: individual  Related Interventions Explore the effect of the client's vocational stress on his/her intra- and interpersonal dynamics with friends and family. Objective Identify and replace distorted cognitive messages associated with feelings of job stress. Target Date: 2021-09-19 Frequency: monthly  Progress: 80 Modality: individual  Related Interventions Probe and clarify the client's emotions surrounding his/her  vocational stress. Assess the client's distorted cognitive messages and schema that foster his/her vocational stress; replace these messages with positive cognitions (or assign "Negative Thoughts Trigger Negative Feelings" in the Adult Psychotherapy Homework Planner by Bryn Gulling). Confront the client's pattern of catastrophizing situations leading to immobilizing anxiety; replace these messages with realistic thoughts. Objective Learn and implement calming skills to reduce overall anxiety and manage anxiety symptoms. Target Date: 2021-09-19 Frequency: monthly  Progress: 90 Modality: individual  9. Interact socially without undue distress or disability. Objective Form realistic, appropriate, and attainable goals for self in all areas of life. Target Date: 2021-09-19 Frequency: monthly  Progress: 81 Modality: individual  Related Interventions Assign the client to make a list of goals for various areas of life and a plan for steps toward goal attainment. Help the client analyze his/her goals to make sure they are realistic and attainable. Objective Identify and engage in activities that would improve self-image by being consistent with one's values. Target Date: 2021-09-19 Frequency: monthly  Progress: 90 Modality: individual  Related Interventions Identify and assign activities congruent with the client's values; process them toward improving self-concept and self-esteem. Help the client analyze his/her values and the congruence or incongruence between them and the client's daily activities. Objective Increase insight into the historical and current sources of low self-esteem. Target Date: 2021-09-19 Frequency: monthly  Progress: 37 Modality: individual  Related Interventions Discuss, emphasize, and interpret the client's incidents of abuse (emotional, physical, and sexual) and how they have impacted his/her feelings about himself/herself. Help the client become aware of his/her fear of  rejection and its connection with past rejection or abandonment experiences; begin to contrast past experiences of pain with present experiences of acceptance and competence.  Diagnosis:Major depressive disorder, recurrent episode, moderate (HCC)  Generalized anxiety disorder  Plan:  -read forwarded materials on self-esteem -meet again on Wednesday, August 29, 2021 at [place], [facility]

## 2021-08-22 ENCOUNTER — Ambulatory Visit (INDEPENDENT_AMBULATORY_CARE_PROVIDER_SITE_OTHER): Payer: 59 | Admitting: Behavioral Health

## 2021-08-22 ENCOUNTER — Other Ambulatory Visit: Payer: Self-pay

## 2021-08-22 ENCOUNTER — Encounter: Payer: Self-pay | Admitting: Behavioral Health

## 2021-08-22 DIAGNOSIS — F411 Generalized anxiety disorder: Secondary | ICD-10-CM | POA: Diagnosis not present

## 2021-08-22 DIAGNOSIS — F410 Panic disorder [episodic paroxysmal anxiety] without agoraphobia: Secondary | ICD-10-CM | POA: Diagnosis not present

## 2021-08-22 DIAGNOSIS — F331 Major depressive disorder, recurrent, moderate: Secondary | ICD-10-CM

## 2021-08-22 DIAGNOSIS — R69 Illness, unspecified: Secondary | ICD-10-CM | POA: Diagnosis not present

## 2021-08-22 MED ORDER — BUSPIRONE HCL 15 MG PO TABS: 15.0000 mg | ORAL_TABLET | Freq: Two times a day (BID) | ORAL | 3 refills | Status: DC

## 2021-08-22 MED ORDER — SERTRALINE HCL 100 MG PO TABS: 100.0000 mg | ORAL_TABLET | Freq: Every day | ORAL | 3 refills | Status: DC

## 2021-08-22 NOTE — Progress Notes (Signed)
[facility] ? ?Patient ID: Elizabeth [name],  ?MRN: [id number] ? ?PCP: Hali Marry, MD ? ?Date of Evaluation: 08/22/2021 ?Time spent:30 minutes ? ?Chief Complaint:  ?Chief Complaint   ?Anxiety; Depression; Follow-up; Medication Refill ?  ? ? ?HISTORY/CURRENT STATUS: ?HPI ?61 year old female presents to this office for follow up and medication management. She says that she is feeling very well right now. She feels stable with decreased anxiety and depression. She reports that she continues with psychotherapy and agrees that she has much improved over the last 6 months. No medication changes are indicated at this time.  She reports decreased anxiety at 2/10 and depression at 1/10. She is sleeping 6- 7  hours per night. 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 No mania, no psychosis, no SI/HI. ? ?Past Psychiatric medication failures: ?Prozac ?Paxil ?Cymbalta ?Effexor ?Wellbutrin ?  ? ?Individual Medical History/ Review of Systems: Changes? :No  ? ?Allergies: Dextromethorphan-guaifenesin, Fluoxetine hcl, and Wellbutrin [bupropion] ? ?Current Medications:  ?Current Outpatient Medications:  ?  ALPRAZolam (XANAX) 0.25 MG tablet, Take 1 tablet (0.25 mg total) by mouth at bedtime as needed for anxiety., Disp: 30 tablet, Rfl: 0 ?  Ascorbic Acid (VITAMIN C) 1000 MG tablet, Take 1 tablet by mouth daily., Disp: , Rfl:  ?  busPIRone (BUSPAR) 15 MG tablet, Take 1 tablet (15 mg total) by mouth 2 (two) times daily., Disp: 60 tablet, Rfl: 3 ?  CAFFEINE PO, Take 50 mg by mouth daily., Disp: , Rfl:  ?  Cholecalciferol (VITAMIN D-3 PO), Take 2,000 Units by mouth daily., Disp: , Rfl:  ?  Coenzyme Q10 (CO Q 10) 100 MG CAPS, Take 100 mg by mouth daily., Disp: , Rfl:  ?  ferrous sulfate 325 (65 FE) MG tablet, Take 325 mg by mouth See admin instructions. Tuesday, Thursday, Saturday and sunday, Disp: , Rfl:   ?  gabapentin (NEURONTIN) 800 MG tablet, TAKE ONE TABLET BY MOUTH EVERY NIGHT AT BEDTIME, Disp: 90 tablet, Rfl: 1 ?  Glucosamine 750 MG TABS, Take 1 tablet by mouth 2 (two) times daily., Disp: , Rfl:  ?  loratadine (CLARITIN) 10 MG tablet, Take 10 mg by mouth daily., Disp: , Rfl:  ?  methocarbamol (ROBAXIN) 750 MG tablet, Take 1 tablet (750 mg total) by mouth 4 (four) times daily as needed (use for muscle cramps/pain)., Disp: 30 tablet, Rfl: 2 ?  Methylcellulose, Laxative, (CITRUCEL) 500 MG TABS, Take by mouth in the morning and at bedtime. (Patient not taking: No sig reported), Disp: , Rfl:  ?  Multiple Vitamin (MULTIVITAMIN) tablet, Take 1 tablet by mouth daily., Disp: , Rfl:  ?  norethindrone-ethinyl estradiol (FEMHRT 1/5) 1-5 MG-MCG TABS tablet, Take 1 tablet by mouth at bedtime., Disp: , Rfl:  ?  Omega-3 Fatty Acids (FISH OIL) 645 MG CAPS, Take by mouth., Disp: , Rfl:  ?  oxyCODONE (OXY IR/ROXICODONE) 5 MG immediate release tablet, Take 1-2 tablets (5-10 mg total) by mouth every 6 (six) hours as needed for moderate pain, severe pain or breakthrough pain., Disp: 20 tablet, Rfl: 0 ?  sertraline (ZOLOFT) 100 MG tablet, Take 1 tablet (100 mg total) by mouth daily., Disp: 30 tablet, Rfl: 3 ?  vitamin E 180 MG (400 UNITS) capsule, Take 400 Units by mouth daily., Disp: , Rfl:  ?Medication Side Effects: none ? ?Family Medical/ Social History: Changes? No ? ?  MENTAL HEALTH EXAM: ? ?There were no vitals taken for this visit.There is no height or weight on file to calculate BMI.  ?General Appearance: Casual, Neat, and Well Groomed  ?Eye Contact:  Good  ?Speech:  Clear and Coherent  ?Volume:  Normal  ?Mood:  NA  ?Affect:  Appropriate  ?Thought Process:  Coherent  ?Orientation:  Full (Time, Place, and Person)  ?Thought Content: Logical   ?Suicidal Thoughts:  No  ?Homicidal Thoughts:  No  ?Memory:  WNL  ?Judgement:  Good  ?Insight:  Good  ?Psychomotor Activity:  Normal  ?Concentration:  Concentration: Good  ?Recall:  Good   ?Fund of Knowledge: Good  ?Language: Good  ?Assets:  Desire for Improvement  ?ADL's:  Intact  ?Cognition: WNL  ?Prognosis:  Good  ? ? ?DIAGNOSES:  ?  ICD-10-CM   ?1. Major depressive disorder, recurrent episode, moderate (HCC)  F33.1 sertraline (ZOLOFT) 100 MG tablet  ?  ?2. Generalized anxiety disorder  F41.1 sertraline (ZOLOFT) 100 MG tablet  ?  ?3. Panic  F41.0   ?  ? ? ?Receiving Psychotherapy: yes, [name] ? ? ?RECOMMENDATIONS:  ?No changes this visit ?Continue  Zoloft 100 mg daily ?Continue Buspar 15 mg twice daily ?Will report side affects or worsening symptoms promptly ?Will follow up in 6 months to reassess ?Greater than 50% of 30 min face to face time with patient was spent on counseling and coordination of care.  We discussed her current stability  with  anxiety and depression. Patient agreed to DC zoloft if rash develops. She will continue psychotherapy. Emergency contact and after hours information provided. Patient verbally contracted for safety. Refills escribed to patient pharmacy.  ?  ? ? ? ? ? ? ?[name], NP  ?

## 2021-08-29 ENCOUNTER — Ambulatory Visit (INDEPENDENT_AMBULATORY_CARE_PROVIDER_SITE_OTHER): Payer: 59 | Admitting: Professional

## 2021-08-29 ENCOUNTER — Encounter: Payer: Self-pay | Admitting: Professional

## 2021-08-29 DIAGNOSIS — R69 Illness, unspecified: Secondary | ICD-10-CM | POA: Diagnosis not present

## 2021-08-29 DIAGNOSIS — F411 Generalized anxiety disorder: Secondary | ICD-10-CM | POA: Diagnosis not present

## 2021-08-29 DIAGNOSIS — F331 Major depressive disorder, recurrent, moderate: Secondary | ICD-10-CM | POA: Diagnosis not present

## 2021-08-29 NOTE — Progress Notes (Signed)
[facility] Counselor/Therapist Progress Note ? ?Patient ID: Elizabeth [name], MRN: [id number],   ? ?Date: 08/29/2021 ? ?Time Spent: 52 minutes 1102-1154 am ? ?Treatment Type: Individual Therapy ? ?Risk Assessment: ?Danger to Self:  No ?Self-injurious Behavior: No ?Danger to Others: No ? ?Subjective: This session was held via video teletherapy due to the coronavirus risk at this time. The patient consented to video teletherapy and was located at her home during this session. She is aware it is the responsibility of the patient to secure confidentiality on her end of the session. The provider was in a private home office for the duration of this session.  ? ?The patient arrived on time for her webex session. ? ?Issues addressed: ?1- homework- completed ?-read forwarded materials on self-esteem ?-pt reports she learned a lot about the origin of her low self-esteem ?-the impact her parents and older siblings had on her ?-pt feels she is resolving her childhood issues ?-she admits that she needs to resolve the marital issues "the Ronalee Belts issues" ?  -she thinks through his death and getting the rest of the picture ?  -still working on forgiving herself  ?    -she does not trust herself ?2-personal ?-doing well, frustrated with technology ?-has not been sleeping poorly ?-got a new phone ?  -still trying to get familiar ?  -having issues with credit card reader and phone ?  -having issues with getting tech support ?3-professional ?-has a work social ?  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  -didn't feel as relaxed ?  -the younger staff called her Dr. But on weekends she is just TRUE ?  -felt exhausted from activity ?    -still does not feel rested ?4-mood ?-pretty good ?-saw PA and meds are kept the same ?  -six month visits and call sooner as needed ? ?Problems Addressed  ?Low Self-Esteem, Unipolar Depression ?Goals ?1. Alleviate depressive symptoms and return to previous level of effective functioning. ?2.  Appropriately grieve the loss in order to normalize mood and to return to previously adaptive level of functioning. ?Objective ?Increasingly verbalize hopeful and positive statements regarding self, others, and the future. ?Target Date: 2021-09-19 Frequency: monthly  ?Progress: 80 Modality: individual  ?Objective ?Verbalize insight into how past relationships may be influencing current experiences with depression. ?Target Date: 2021-09-19 Frequency: monthly  ?Progress: 80 Modality: individual  ?Related Interventions ?Explain a connection between previously unexpressed (repressed) feelings of anger (and helplessness) and current state of depression. ?Encourage the client to share feelings of anger regarding pain inflicted on him/her in childhood that contributed to current depressed state. ?Explore experiences from the client's childhood that contribute to current depressed state. ?Conduct Brief Psychodynamic Therapy for depression to help the client increase insight into the role that past relational patterns may be influencing current vulnerabilities to depression; identify core conflictual themes; process with the client toward making changes in current relational patterns (see Supportive-Expressive Dynamic Psychotherapy of Depression by Delrae Alfred al.). ?Objective ?Verbalize an understanding of healthy and unhealthy emotions with the intent of increasing the use of healthy emotions to guide actions. ?Target Date: 2021-09-19 Frequency: monthly  ?Progress: 80 Modality: individual  ?Related Interventions ?Use a process-experiential approach consistent with Emotion-Focused Therapy to create a safe, nurturing environment in which the client can process emotions, learning to identify and regulate unhealthy feelings and to generate more adaptive ones that then guide actions (see Emotion-Focused Therapy for Depression by Andrey Spearman). ?Objective ?Learn and implement relapse  prevention skills. ?Target Date:  2021-09-19 Frequency: monthly  ?Progress: 80 Modality: individual  ?Related Interventions ?Build the client's relapse prevention skills by helping him/her identify early warning signs of relapse and rehearsing the use of skills learned during therapy to manage them. ?Identify and rehearse with the client the management of future situations or circumstances in which lapses could occur. ?Discuss with the client the distinction between a lapse and relapse, associating a lapse with a rather common, temporary setback that may involve, for example, re-experiencing a depressive thought and/or urge to withdraw or avoid (perhaps as related to some loss or conflict) and a relapse as a sustained return to a pattern of depressive thinking and feeling usually accompanied by interpersonal withdrawal and/or avoidance. ?Objective ?Learn and implement behavioral strategies to overcome depression. ?Target Date: 2021-09-19 Frequency: monthly  ?Progress: 90 Modality: individual  ?Related Interventions ?Assist the client in developing skills that increase the likelihood of deriving pleasure from behavioral activation (e.g., assertiveness skills, developing an exercise plan, less internal/more external focus, increased social involvement); reinforce success. ?Objective ?Identify and replace thoughts and beliefs that support depression. ?Target Date: 2021-09-19 Frequency: monthly  ?Progress: 90 Modality: individual  ?Related Interventions ?Explore and restructure underlying assumptions and beliefs reflected in biased self-talk that may put the client at risk for relapse or recurrence. ?Facilitate and reinforce the client's shift from biased depressive self-talk and beliefs to reality-based cognitive messages that enhance self-confidence and increase adaptive actions (see "Positive Self-Talk" in the Adult Psychotherapy Homework Planner by Bryn Gulling). ?Assign "behavioral experiments" in which depressive automatic thoughts are treated as  hypotheses/prediction, reality-based alternative hypotheses/prediction are generated, and both are tested against the client's past, present, and/or future experiences. ?Assign the client to self-monitor thoughts, feelings, and actions in daily journal (e.g., "Negative Thoughts Trigger Negative Feelings" in the Adult Psychotherapy Homework Planner by Bryn Gulling; "Daily Record of Dysfunctional Thoughts" in Cognitive Therapy of Depression by Lupita Shutter and Warren Lacy); process the journal material to challenge depressive thinking patterns and replace them with reality-based thoughts. ?Conduct Cognitive-Behavioral Therapy (see Cognitive Behavior Therapy by Olevia Bowens; Overcoming Depression by Lynita Lombard al.), beginning with helping the client learn the connection among cognition, depressive feelings, and actions. ?3. Develop a consistent, positive self-image. ?4. Develop healthy thinking patterns and beliefs about self, others, and the world that lead to the alleviation and help prevent the relapse of depression. ?5. Develop the necessary skills for effective, open communication, mutually satisfying sexual intimacy, and enjoyable time for companionship within the relationship. ?6. Elevate self-esteem. ?7. Increase awareness of own role in the relationship conflicts. ?Objective ?Learn and implement problem-solving and conflict resolution skills. ?Target Date: 2021-09-19 Frequency: monthly  ?Progress: 70 Modality: individual  ?Related Interventions ?Assign the couple a homework exercise to use and record newly learned problem-solving and conflict resolution skills (or assign "Applying Problem-Solving to Interpersonal Conflict" in the Adult Psychotherapy Homework Planner by [facility]); process results in session. ?Use behavioral techniques (education, modeling, role-playing, corrective feedback, and positive reinforcement) to teach the couple problem-solving and conflict resolution skills including defining the problem  constructively and specifically, brainstorming options, evaluating options, compromise, choosing options and implementing a plan, evaluating the results. ?Objective ?Implement increased socialization activities to cope wit

## 2021-10-03 ENCOUNTER — Ambulatory Visit (INDEPENDENT_AMBULATORY_CARE_PROVIDER_SITE_OTHER): Payer: 59 | Admitting: Professional

## 2021-10-03 ENCOUNTER — Encounter: Payer: Self-pay | Admitting: Family Medicine

## 2021-10-03 ENCOUNTER — Ambulatory Visit: Payer: 59 | Admitting: Family Medicine

## 2021-10-03 ENCOUNTER — Encounter: Payer: Self-pay | Admitting: Professional

## 2021-10-03 VITALS — BP 142/78 | HR 51 | Resp 16 | Ht 64.0 in | Wt 178.0 lb

## 2021-10-03 DIAGNOSIS — R69 Illness, unspecified: Secondary | ICD-10-CM | POA: Diagnosis not present

## 2021-10-03 DIAGNOSIS — R21 Rash and other nonspecific skin eruption: Secondary | ICD-10-CM | POA: Diagnosis not present

## 2021-10-03 DIAGNOSIS — G2581 Restless legs syndrome: Secondary | ICD-10-CM

## 2021-10-03 DIAGNOSIS — F331 Major depressive disorder, recurrent, moderate: Secondary | ICD-10-CM

## 2021-10-03 DIAGNOSIS — R197 Diarrhea, unspecified: Secondary | ICD-10-CM

## 2021-10-03 DIAGNOSIS — F411 Generalized anxiety disorder: Secondary | ICD-10-CM | POA: Diagnosis not present

## 2021-10-03 MED ORDER — GABAPENTIN 800 MG PO TABS: 800.0000 mg | ORAL_TABLET | Freq: Every day | ORAL | 3 refills | Status: DC

## 2021-10-03 MED ORDER — EPINEPHRINE 0.3 MG/0.3ML IJ SOAJ: 0.3000 mg | INTRAMUSCULAR | 1 refills | Status: AC | PRN

## 2021-10-03 NOTE — Progress Notes (Signed)
? ?Established Patient Office Visit ? ?Subjective   ?Patient ID: Elizabeth [name], female    DOB: 1961/04/16  Age: 61 y.o. MRN: [id number] ? ?Chief Complaint  ?Patient presents with  ? Rash  ?  Intermittent skin rash and GI symptoms for several months. Patient stated she has had several tick bites.   ? Discuss Medication  ?  Patient requesting for printed prescription for Epi Pen   ? ? ?HPI ? ?Intermittent skin rash and GI symptoms for several months. Patient stated she has had several tick bites.  ? ?Last spring in March she had her GB removed and then in October she had a hernia repair.  Sometime around after her hernia surgery she started noticing that she would occasionally wake up with a rash sometimes around her wrist or arms on her back.  Initially she was relating it to showering she thought maybe it was a loofah or something that was causing some irritation.  And then started evaluating her diet to see if she could pinpoint any triggers and really could not find anything.  She says that usually a mild rash is somewhat itchy but not intensely so.  Often comes around her wrists.  Last for several hours and then seems to go away on its own.  She then started noticing she was having episodes of diarrhea gas loose stools and bloating.  And they would often coincide with the rash.  She tends to get frequent tick bites that [place] and does a lot outside.  She actually heard in news clipped on [employer] about alpha gal and started to do a little research and started to wonder if that might be the cause of her intermittent rashes and diarrhea.  She says around [place] she went a whole week without eating any type of beef product she mostly just had poultry and then had a fair amount of corn beef hash 1 day and the next day had pretty severe symptoms with her rash and diarrhea.  She does have some photos on her phone of the rash. ? ?Needs a prescription for EpiPen so that she can price compare at  pharmacies. ? ? ? ?ROS ? ?  ?Objective:  ?  ? ?BP (!) 142/78   Pulse (!) 51   Resp 16   Ht '5\' 4"'$  (1.626 m)   Wt 178 lb (80.7 kg)   SpO2 97%   BMI 30.55 kg/m?  ? ? ?Physical Exam ?Vitals and nursing note reviewed.  ?Constitutional:   ?   Appearance: She is well-developed.  ?HENT:  ?   Head: Normocephalic and atraumatic.  ?Cardiovascular:  ?   Rate and Rhythm: Normal rate and regular rhythm.  ?   Heart sounds: Normal heart sounds.  ?Pulmonary:  ?   Effort: Pulmonary effort is normal.  ?   Breath sounds: Normal breath sounds.  ?Abdominal:  ?   General: Bowel sounds are normal. There is no distension.  ?   Palpations: Abdomen is soft. There is no mass.  ?   Tenderness: There is no guarding or rebound.  ?Skin: ?   General: Skin is warm and dry.  ?Neurological:  ?   Mental Status: She is alert and oriented to person, place, and time.  ?Psychiatric:     ?   Behavior: Behavior normal.  ? ? ? ?No results found for any visits on 10/03/21. ? ? ? ?The 10-year ASCVD risk score (Arnett DK, et al., 2019)  is: 3.1% ? ?  ?Assessment & Plan:  ? ?Problem List Items Addressed This Visit   ? ?  ? Other  ? RLS (restless legs syndrome)  ?  Continue gabapentin.  Refills sent to pharmacy. ? ?  ?  ? Relevant Medications  ? gabapentin (NEURONTIN) 800 MG tablet  ? Major depressive disorder, recurrent episode, moderate ([place])  ?  All she is actually doing really well this year.  She feels like she is in a much better place than last year she is happy with her current medication regimen and she still engaged with therapy with Juliann Pulse. ? ?  ?  ? ?Other Visit Diagnoses   ? ? Diarrhea, unspecified type    -  Primary  ? Relevant Orders  ? Alpha-Gal Panel  ? CBC with Differential/Platelet  ? Rash      ? Relevant Orders  ? Alpha-Gal Panel  ? CBC with Differential/Platelet  ? ?  ? ?Rash with intermittent GI symptoms-I definitely think her symptoms are congruent with alpha gal.  We will start by doing some blood work.  Consider referring to an  allergist if labs come back normal we will go ahead and get a CBC with differential as well. ? ?No follow-ups on file.  ? ? ?Beatrice Lecher, MD ? ?

## 2021-10-03 NOTE — Assessment & Plan Note (Signed)
Continue gabapentin.  Refills sent to pharmacy. ?

## 2021-10-03 NOTE — Assessment & Plan Note (Signed)
All she is actually doing really well this year.  She feels like she is in a much better place than last year she is happy with her current medication regimen and she still engaged with therapy with Juliann Pulse. ?
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
## 2021-10-05 ENCOUNTER — Telehealth: Payer: Self-pay | Admitting: Family Medicine

## 2021-10-06 LAB — CBC WITH DIFFERENTIAL/PLATELET
Absolute Monocytes: 437 cells/uL (ref 200–950)
Basophils Absolute: 52 cells/uL (ref 0–200)
Basophils Relative: 1 %
Eosinophils Absolute: 140 cells/uL (ref 15–500)
Eosinophils Relative: 2.7 %
HCT: 44.6 % (ref 35.0–45.0)
Hemoglobin: 14.6 g/dL (ref 11.7–15.5)
Lymphs Abs: 1804 cells/uL (ref 850–3900)
MCH: 30.5 pg (ref 27.0–33.0)
MCHC: 32.7 g/dL (ref 32.0–36.0)
MCV: 93.3 fL (ref 80.0–100.0)
MPV: 11.4 fL (ref 7.5–12.5)
Monocytes Relative: 8.4 %
Neutro Abs: 2766 cells/uL (ref 1500–7800)
Neutrophils Relative %: 53.2 %
Platelets: 209 10*3/uL (ref 140–400)
RBC: 4.78 10*6/uL (ref 3.80–5.10)
RDW: 11.5 % (ref 11.0–15.0)
Total Lymphocyte: 34.7 %
WBC: 5.2 10*3/uL (ref 3.8–10.8)

## 2021-10-06 LAB — ALPHA-GAL PANEL
Allergen, Mutton, f88: <0.1 kU/L
Allergen, Pork, f26: <0.1 kU/L
Beef: <0.1 kU/L
CLASS: 0
CLASS: 0
Class: 0
GALACTOSE-ALPHA-1,3-GALACTOSE IGE*: <0.1 kU/L (ref <0.10)

## 2021-10-06 LAB — INTERPRETATION:

## 2021-10-08 NOTE — Progress Notes (Signed)
Hi Elizabeth [name], the alpha gal IgE was undetectable as well as IgE levels to beef, pork and mutton.  Your complete blood count is normal as well.  One option would be to consider referral to an allergist for further evaluation.  It may be worth looking into some other options.  If you are okay with this please let me know if you have a preference for provider or location.

## 2021-10-09 ENCOUNTER — Other Ambulatory Visit: Payer: Self-pay | Admitting: *Deleted

## 2021-10-09 NOTE — Addendum Note (Signed)
Addended by: Beatrice Lecher D on: 10/09/2021 12:53 PM ? ? Modules accepted: Orders ? ?

## 2021-10-09 NOTE — Progress Notes (Signed)
?      Ambulatory referral to Allergy ?        Referral Priority:Routine ?        Referral Type:Allergy Testing ?        Referral Reason:Specialty Services Required ?        Requested Specialty:Allergy ?        Number of Visits Requested:1 ? ?

## 2021-10-18 ENCOUNTER — Encounter: Payer: Self-pay | Admitting: Family Medicine

## 2021-10-29 ENCOUNTER — Encounter: Payer: Self-pay | Admitting: Professional

## 2021-10-29 ENCOUNTER — Ambulatory Visit (INDEPENDENT_AMBULATORY_CARE_PROVIDER_SITE_OTHER): Payer: 59 | Admitting: Professional

## 2021-10-29 DIAGNOSIS — F411 Generalized anxiety disorder: Secondary | ICD-10-CM

## 2021-10-29 DIAGNOSIS — F331 Major depressive disorder, recurrent, moderate: Secondary | ICD-10-CM

## 2021-10-29 DIAGNOSIS — R69 Illness, unspecified: Secondary | ICD-10-CM | POA: Diagnosis not present
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
## 2021-12-12 ENCOUNTER — Ambulatory Visit: Payer: Self-pay | Admitting: Internal Medicine

## 2021-12-17 ENCOUNTER — Other Ambulatory Visit: Payer: Self-pay | Admitting: Behavioral Health

## 2021-12-17 DIAGNOSIS — F331 Major depressive disorder, recurrent, moderate: Secondary | ICD-10-CM

## 2021-12-17 DIAGNOSIS — F411 Generalized anxiety disorder: Secondary | ICD-10-CM

## 2021-12-19 ENCOUNTER — Encounter: Payer: Self-pay | Admitting: Professional

## 2021-12-19 ENCOUNTER — Ambulatory Visit (INDEPENDENT_AMBULATORY_CARE_PROVIDER_SITE_OTHER): Payer: 59 | Admitting: Professional

## 2021-12-19 DIAGNOSIS — R69 Illness, unspecified: Secondary | ICD-10-CM | POA: Diagnosis not present

## 2021-12-19 DIAGNOSIS — F331 Major depressive disorder, recurrent, moderate: Secondary | ICD-10-CM

## 2021-12-19 DIAGNOSIS — F411 Generalized anxiety disorder: Secondary | ICD-10-CM
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
## 2021-12-25 DIAGNOSIS — J302 Other seasonal allergic rhinitis: Secondary | ICD-10-CM | POA: Diagnosis not present

## 2021-12-25 DIAGNOSIS — L298 Other pruritus: Secondary | ICD-10-CM | POA: Diagnosis not present

## 2021-12-25 DIAGNOSIS — R140 Abdominal distension (gaseous): Secondary | ICD-10-CM | POA: Diagnosis not present

## 2021-12-25 DIAGNOSIS — Z91018 Allergy to other foods: Secondary | ICD-10-CM | POA: Diagnosis not present

## 2022-01-21 ENCOUNTER — Ambulatory Visit (INDEPENDENT_AMBULATORY_CARE_PROVIDER_SITE_OTHER): Payer: 59 | Admitting: Professional

## 2022-01-21 ENCOUNTER — Encounter: Payer: Self-pay | Admitting: Professional

## 2022-01-21 DIAGNOSIS — F411 Generalized anxiety disorder: Secondary | ICD-10-CM | POA: Diagnosis not present

## 2022-01-21 DIAGNOSIS — F331 Major depressive disorder, recurrent, moderate: Secondary | ICD-10-CM | POA: Diagnosis not present

## 2022-01-21 DIAGNOSIS — R69 Illness, unspecified: Secondary | ICD-10-CM | POA: Diagnosis not present
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
## 2022-02-13 ENCOUNTER — Encounter: Payer: Self-pay | Admitting: Family Medicine

## 2022-02-13 ENCOUNTER — Ambulatory Visit (INDEPENDENT_AMBULATORY_CARE_PROVIDER_SITE_OTHER): Payer: 59 | Admitting: Family Medicine

## 2022-02-13 VITALS — BP 132/78 | HR 57 | Ht 64.0 in | Wt 173.0 lb

## 2022-02-13 DIAGNOSIS — Z23 Encounter for immunization: Secondary | ICD-10-CM | POA: Diagnosis not present

## 2022-02-13 DIAGNOSIS — Z0000 Encounter for general adult medical examination without abnormal findings: Secondary | ICD-10-CM

## 2022-02-13 NOTE — Progress Notes (Signed)
Complete physical exam  Patient: Elizabeth [name]   DOB: 23-Jun-1960   61 y.o. Female  MRN: [id number]  Subjective:    Chief Complaint  Patient presents with   Annual Exam    Elizabeth [name] is a 61 y.o. female who presents today for a complete physical exam. She reports consuming a general diet. The patient does not participate in regular exercise at present. She generally feels well. . She does not have additional problems to discuss today.  Be GYN appointment coming up at the end of the year.  Due for Pap smear.   Most recent fall risk assessment:    02/13/2022    9:13 AM  [place] in the past year? 0  Number falls in past yr: 0  Injury with Fall? 0  Risk for fall due to : No Fall Risks  Follow up Falls evaluation completed     Most recent depression screenings:    02/13/2022    9:19 AM 10/03/2021    8:50 AM  PHQ 2/9 Scores  PHQ - 2 Score 0 1  PHQ- 9 Score 2 4        Patient Care Team: Hali Marry, MD as PCP - General (Family Medicine) Mickel Fuchs, MD as Referring Physician (Neurology) Gustavo Lah, NP as Referring Physician (Obstetrics and Gynecology) Michael Boston, MD as Consulting Physician (General Surgery)   Outpatient Medications Prior to Visit  Medication Sig   ALPRAZolam (XANAX) 0.25 MG tablet Take 1 tablet (0.25 mg total) by mouth at bedtime as needed for anxiety.   Ascorbic Acid (VITAMIN C) 1000 MG tablet Take 1 tablet by mouth daily.   busPIRone (BUSPAR) 15 MG tablet TAKE ONE TABLET BY MOUTH TWICE A DAY   CAFFEINE PO Take 100 mg by mouth daily.   Cholecalciferol (VITAMIN D-3 PO) Take 2,000 Units by mouth daily.   Coenzyme Q10 (CO Q 10) 100 MG CAPS Take 100 mg by mouth daily.   diphenhydrAMINE (BENADRYL ALLERGY CHILDRENS) 12.5 MG chewable tablet Chew 12.5 mg by mouth every evening.   EPINEPHrine (EPIPEN 2-PAK) 0.3 mg/0.3 mL IJ SOAJ injection Inject 0.3 mg into the muscle as needed for anaphylaxis.   ferrous sulfate 325 (65  FE) MG tablet Take 325 mg by mouth See admin instructions. Tuesday, Thursday, Saturday and sunday   fexofenadine (ALLEGRA) 180 MG tablet Take 180 mg by mouth every morning.   gabapentin (NEURONTIN) 800 MG tablet Take 1 tablet (800 mg total) by mouth at bedtime.   Glucosamine 750 MG TABS Take 1 tablet by mouth 2 (two) times daily.   Multiple Vitamin (MULTIVITAMIN) tablet Take 1 tablet by mouth daily.   norethindrone-ethinyl estradiol (FEMHRT 1/5) 1-5 MG-MCG TABS tablet Take 1 tablet by mouth at bedtime.   Omega-3 Fatty Acids (FISH OIL) 645 MG CAPS Take 1 Capful by mouth. Tuesday, Thursday, Saturday, Sunday   sertraline (ZOLOFT) 100 MG tablet TAKE ONE TABLET BY MOUTH DAILY   vitamin E 180 MG (400 UNITS) capsule Take 400 Units by mouth daily.   [DISCONTINUED] loratadine (CLARITIN) 10 MG tablet Take 10 mg by mouth daily.   [DISCONTINUED] Methylcellulose, Laxative, (CITRUCEL) 500 MG TABS Take by mouth in the morning and at bedtime.   No facility-administered medications prior to visit.    ROS        Objective:     BP (!) 151/89   Pulse (!) 57   Ht '5\' 4"'$  (1.626 m)   Wt 173 lb (78.5  kg)   SpO2 99%   BMI 29.70 kg/m    Physical Exam Vitals and nursing note reviewed.  Constitutional:      Appearance: She is well-developed.  HENT:     Head: Normocephalic and atraumatic.     Right Ear: Tympanic membrane, ear canal and external ear normal.     Left Ear: Tympanic membrane, ear canal and external ear normal.     Nose: Nose normal.     Mouth/Throat:     Pharynx: Oropharynx is clear.  Eyes:     Conjunctiva/sclera: Conjunctivae normal.     Pupils: Pupils are equal, round, and reactive to light.  Neck:     Thyroid: No thyromegaly.  Cardiovascular:     Rate and Rhythm: Normal rate and regular rhythm.     Heart sounds: Normal heart sounds.  Pulmonary:     Effort: Pulmonary effort is normal.     Breath sounds: Normal breath sounds. No wheezing.  Abdominal:     General: Bowel sounds  are normal.     Palpations: Abdomen is soft.  Musculoskeletal:     Cervical back: Neck supple.  Lymphadenopathy:     Cervical: No cervical adenopathy.  Skin:    General: Skin is warm and dry.     Coloration: Skin is not pale.  Neurological:     General: No focal deficit present.     Mental Status: She is alert and oriented to person, place, and time.  Psychiatric:        Mood and Affect: Mood normal.        Behavior: Behavior normal.      No results found for any visits on 02/13/22.     Assessment & Plan:    Routine Health Maintenance and Physical Exam  Immunization History  Administered Date(s) Administered   Influenza Inj Mdck Quad Pf 03/16/2019   Influenza Split 02/25/2012   Influenza Whole 03/14/2008, 03/22/2009, 04/04/2010   Influenza,inj,Quad PF,6+ Mos 02/18/2013   Influenza-Unspecified 04/11/2016, 03/16/2019   PFIZER(Purple Top)SARS-COV-2 Vaccination 07/30/2019, 08/24/2019, 04/29/2020   Pfizer Covid-19 Vaccine Bivalent Booster 80yr & up 01/25/2021   Td 09/27/2002   Tdap 08/07/2011, 02/13/2022   Zoster, Live 05/04/2015    Health Maintenance  Topic Date Due   PAP SMEAR-Modifier  04/03/2022   Zoster Vaccines- Shingrix (1 of 2) 03/15/2022 (Originally 05/26/2012)   INFLUENZA VACCINE  08/25/2022 (Originally 12/25/2021)   MAMMOGRAM  05/03/2023   COLONOSCOPY (Pts 45-429yrInsurance coverage will need to be confirmed)  02/24/2024   TETANUS/TDAP  02/14/2032   COVID-19 Vaccine  Completed   Hepatitis C Screening  Completed   HIV Screening  Completed   Pneumococcal Vaccine [id number] old  Aged Out   HPV VACCINES  Aged Out    Discussed health benefits of physical activity, and encouraged her to engage in regular exercise appropriate for her age and condition.  Problem List Items Addressed This Visit   None Visit Diagnoses     Wellness examination    -  Primary   Relevant Orders   Lipid Panel w/reflex Direct LDL   Tdap vaccine greater than or equal to 7yo IM  (Completed)   HgB A1c   Need for tetanus, diphtheria, and acellular pertussis (Tdap) vaccine in patient of adolescent age or older       Relevant Orders   Tdap vaccine greater than or equal to 7yo IM (Completed)       Keep up a regular exercise program and make sure you are  eating a healthy diet Try to eat 4 servings of dairy a day, or if you are lactose intolerant take a calcium with vitamin D daily.  Your vaccines are up to date.  Tdap given today.  Declines flu vaccine and shingles vaccine.  Return in about 1 year (around 02/14/2023) for Wellness Exam.     Beatrice Lecher, MD

## 2022-02-14 LAB — LIPID PANEL W/REFLEX DIRECT LDL
Cholesterol: 184 mg/dL (ref <200)
HDL: 54 mg/dL (ref >=50)
LDL Cholesterol (Calc): 111 mg/dL (calc) — ABNORMAL HIGH
Non-HDL Cholesterol (Calc): 130 mg/dL (calc) — ABNORMAL HIGH (ref <130)
Total CHOL/HDL Ratio: 3.4 (calc) (ref <5.0)
Triglycerides: 93 mg/dL (ref <150)

## 2022-02-14 LAB — HEMOGLOBIN A1C
Hgb A1c MFr Bld: 5.3 % of total Hgb (ref <5.7)
Mean Plasma Glucose: 105 mg/dL
eAG (mmol/L): 5.8 mmol/L

## 2022-02-14 NOTE — Progress Notes (Signed)
Hi Elizabeth [name], LDL is just slightly elevated, just encourage you to continue to work on healthy diet and regular exercise.  A1c looks great.  No sign of diabetes

## 2022-02-20 ENCOUNTER — Encounter: Payer: Self-pay | Admitting: Behavioral Health

## 2022-02-20 ENCOUNTER — Ambulatory Visit: Payer: 59 | Admitting: Behavioral Health

## 2022-02-20 DIAGNOSIS — F331 Major depressive disorder, recurrent, moderate: Secondary | ICD-10-CM | POA: Diagnosis not present

## 2022-02-20 DIAGNOSIS — R69 Illness, unspecified: Secondary | ICD-10-CM | POA: Diagnosis not present

## 2022-02-20 DIAGNOSIS — F411 Generalized anxiety disorder: Secondary | ICD-10-CM

## 2022-02-20 NOTE — Progress Notes (Signed)
[facility] Med Check  Patient ID: Elizabeth [name],  MRN: [id number]  PCP: Hali Marry, MD  Date of Evaluation: 02/20/2022 Time spent:30 minutes  Chief Complaint:  Chief Complaint   Depression; Anxiety; Follow-up; Medication Refill; Patient Education     HISTORY/CURRENT STATUS: HPI 61 year old female presents to this office for follow up and medication management.  No changes this visit. She says that she is feeling very well right now. She feels stable with decreased anxiety and depression. She reports that she continues with psychotherapy and agrees that she has much improved over this past year. She does have a new dog at home now. She would like to do something special for her 61th birthday coming up in Dec. No medication changes are indicated at this time.  She reports decreased anxiety at 2/10 and depression at 1/10. She is sleeping 6- 7  hours per night. . No mania, no psychosis, no SI/HI.   Past Psychiatric medication failures: Prozac Paxil Cymbalta Effexor Wellbutrin    Individual Medical History/ Review of Systems: Changes? :No   Allergies: Dextromethorphan-guaifenesin, Fluoxetine hcl, and Wellbutrin [bupropion]  Current Medications:  Current Outpatient Medications:    ALPRAZolam (XANAX) 0.25 MG tablet, Take 1 tablet (0.25 mg total) by mouth at bedtime as needed for anxiety., Disp: 30 tablet, Rfl: 0   Ascorbic Acid (VITAMIN C) 1000 MG tablet, Take 1 tablet by mouth daily., Disp: , Rfl:    busPIRone (BUSPAR) 15 MG tablet, TAKE ONE TABLET BY MOUTH TWICE A DAY, Disp: 60 tablet, Rfl: 3   CAFFEINE PO, Take 100 mg by mouth daily., Disp: , Rfl:    Cholecalciferol (VITAMIN D-3 PO), Take 2,000 Units by mouth daily., Disp: , Rfl:    Coenzyme Q10 (CO Q 10) 100 MG CAPS, Take 100 mg by mouth daily., Disp: , Rfl:    diphenhydrAMINE (BENADRYL ALLERGY CHILDRENS) 12.5 MG chewable tablet, Chew 12.5 mg by mouth every evening., Disp: , Rfl:    EPINEPHrine (EPIPEN 2-PAK)  0.3 mg/0.3 mL IJ SOAJ injection, Inject 0.3 mg into the muscle as needed for anaphylaxis., Disp: 1 each, Rfl: 1   ferrous sulfate 325 (65 FE) MG tablet, Take 325 mg by mouth See admin instructions. Tuesday, Thursday, Saturday and sunday, Disp: , Rfl:    fexofenadine (ALLEGRA) 180 MG tablet, Take 180 mg by mouth every morning., Disp: , Rfl:    gabapentin (NEURONTIN) 800 MG tablet, Take 1 tablet (800 mg total) by mouth at bedtime., Disp: 90 tablet, Rfl: 3   Glucosamine 750 MG TABS, Take 1 tablet by mouth 2 (two) times daily., Disp: , Rfl:    Multiple Vitamin (MULTIVITAMIN) tablet, Take 1 tablet by mouth daily., Disp: , Rfl:    norethindrone-ethinyl estradiol (FEMHRT 1/5) 1-5 MG-MCG TABS tablet, Take 1 tablet by mouth at bedtime., Disp: , Rfl:    Omega-3 Fatty Acids (FISH OIL) 645 MG CAPS, Take 1 Capful by mouth. Tuesday, Thursday, Saturday, Sunday, Disp: , Rfl:    sertraline (ZOLOFT) 100 MG tablet, TAKE ONE TABLET BY MOUTH DAILY, Disp: 30 tablet, Rfl: 3   vitamin E 180 MG (400 UNITS) capsule, Take 400 Units by mouth daily., Disp: , Rfl:  Medication Side Effects: none  Family Medical/ Social History: Changes? No  MENTAL HEALTH EXAM:  There were no vitals taken for this visit.There is no height or weight on file to calculate BMI.  General Appearance: Casual and Neat  Eye Contact:  Good  Speech:  Clear and Coherent  Volume:  Normal  Mood:  NA  Affect:  Appropriate  Thought Process:  Coherent  Orientation:  Full (Time, Place, and Person)  Thought Content: Logical   Suicidal Thoughts:  No  Homicidal Thoughts:  No  Memory:  WNL  Judgement:  Good  Insight:  Good  Psychomotor Activity:  Normal  Concentration:  Concentration: Good  Recall:  Good  Fund of Knowledge: Good  Language: Good  Assets:  Desire for Improvement  ADL's:  Intact  Cognition: WNL  Prognosis:  Good    DIAGNOSES: No diagnosis found.  Receiving Psychotherapy: No    RECOMMENDATIONS:  No changes this  visit Continue  Zoloft 100 mg daily Continue Buspar 15 mg twice daily Will report side affects or worsening symptoms promptly Will follow up in 5 months to reassess Greater than 50% of 30 min face to face time with patient was spent on counseling and coordination of care.  We discussed her current stability  with  anxiety and depression. Patient agreed to DC zoloft if rash develops. She will continue psychotherapy. Emergency contact and after hours information provided. Patient verbally contracted for safety. Refills escribed to patient pharmacy.                [name], NP

## 2022-03-04 ENCOUNTER — Ambulatory Visit (INDEPENDENT_AMBULATORY_CARE_PROVIDER_SITE_OTHER): Payer: 59 | Admitting: Professional

## 2022-03-04 ENCOUNTER — Encounter: Payer: Self-pay | Admitting: Professional

## 2022-03-04 DIAGNOSIS — F411 Generalized anxiety disorder: Secondary | ICD-10-CM | POA: Diagnosis not present

## 2022-03-04 DIAGNOSIS — F331 Major depressive disorder, recurrent, moderate: Secondary | ICD-10-CM

## 2022-03-04 DIAGNOSIS — R69 Illness, unspecified: Secondary | ICD-10-CM | POA: Diagnosis not present
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
## 2022-03-27 DIAGNOSIS — J302 Other seasonal allergic rhinitis: Secondary | ICD-10-CM | POA: Diagnosis not present

## 2022-03-27 DIAGNOSIS — Z91018 Allergy to other foods: Secondary | ICD-10-CM | POA: Diagnosis not present

## 2022-03-27 DIAGNOSIS — R140 Abdominal distension (gaseous): Secondary | ICD-10-CM | POA: Diagnosis not present

## 2022-03-27 DIAGNOSIS — L508 Other urticaria: Secondary | ICD-10-CM | POA: Diagnosis not present

## 2022-03-27 DIAGNOSIS — L298 Other pruritus: Secondary | ICD-10-CM | POA: Diagnosis not present

## 2022-04-14 ENCOUNTER — Other Ambulatory Visit: Payer: Self-pay | Admitting: Behavioral Health

## 2022-04-14 DIAGNOSIS — F331 Major depressive disorder, recurrent, moderate: Secondary | ICD-10-CM

## 2022-04-14 DIAGNOSIS — F411 Generalized anxiety disorder: Secondary | ICD-10-CM

## 2022-04-15 ENCOUNTER — Ambulatory Visit: Payer: 59 | Admitting: Professional

## 2022-04-15 ENCOUNTER — Encounter: Payer: Self-pay | Admitting: Professional

## 2022-04-15 DIAGNOSIS — F331 Major depressive disorder, recurrent, moderate: Secondary | ICD-10-CM

## 2022-04-15 DIAGNOSIS — R69 Illness, unspecified: Secondary | ICD-10-CM | POA: Diagnosis not present

## 2022-04-15 DIAGNOSIS — F411 Generalized anxiety disorder: Secondary | ICD-10-CM
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
## 2022-05-06 DIAGNOSIS — Z01419 Encounter for gynecological examination (general) (routine) without abnormal findings: Secondary | ICD-10-CM | POA: Diagnosis not present

## 2022-05-06 DIAGNOSIS — Z1231 Encounter for screening mammogram for malignant neoplasm of breast: Secondary | ICD-10-CM | POA: Diagnosis not present

## 2022-05-06 DIAGNOSIS — Z6829 Body mass index (BMI) 29.0-29.9, adult: Secondary | ICD-10-CM | POA: Diagnosis not present

## 2022-05-06 LAB — HM MAMMOGRAPHY

## 2022-06-03 ENCOUNTER — Ambulatory Visit: Payer: 59 | Admitting: Professional

## 2022-06-03 ENCOUNTER — Encounter: Payer: Self-pay | Admitting: Professional

## 2022-06-03 DIAGNOSIS — R69 Illness, unspecified: Secondary | ICD-10-CM | POA: Diagnosis not present

## 2022-06-03 DIAGNOSIS — Z780 Asymptomatic menopausal state: Secondary | ICD-10-CM | POA: Diagnosis not present

## 2022-06-03 DIAGNOSIS — F331 Major depressive disorder, recurrent, moderate: Secondary | ICD-10-CM

## 2022-06-03 DIAGNOSIS — F411 Generalized anxiety disorder: Secondary | ICD-10-CM | POA: Diagnosis not present

## 2022-06-03 LAB — HM DEXA SCAN: HM Dexa Scan: NORMAL
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
## 2022-06-10 ENCOUNTER — Telehealth: Payer: Self-pay | Admitting: Family Medicine

## 2022-06-10 NOTE — Telephone Encounter (Signed)
Call patient: Bone density test was normal which is absolutely fantastic.  Recommend repeat screening when she turns 66.

## 2022-06-11 ENCOUNTER — Telehealth: Payer: Self-pay

## 2022-06-11 NOTE — Telephone Encounter (Signed)
Attempted to call patient back  to let her know that she can request her bone density test results form the facility who did these- ( [facility]?)  our results were probably a hard copy that was sent to be scanned and has not yet been done.   Left voice mail message requesting a return call.

## 2022-06-11 NOTE — Telephone Encounter (Signed)
Patient informed of results.  

## 2022-06-11 NOTE — Telephone Encounter (Signed)
Patient informed. 

## 2022-06-17 ENCOUNTER — Encounter: Payer: Self-pay | Admitting: Family Medicine

## 2022-07-08 ENCOUNTER — Ambulatory Visit (INDEPENDENT_AMBULATORY_CARE_PROVIDER_SITE_OTHER): Payer: 59 | Admitting: Professional

## 2022-07-08 ENCOUNTER — Encounter: Payer: Self-pay | Admitting: Professional

## 2022-07-08 DIAGNOSIS — F331 Major depressive disorder, recurrent, moderate: Secondary | ICD-10-CM

## 2022-07-08 DIAGNOSIS — F411 Generalized anxiety disorder: Secondary | ICD-10-CM | POA: Diagnosis not present

## 2022-07-08 DIAGNOSIS — R69 Illness, unspecified: Secondary | ICD-10-CM | POA: Diagnosis not present
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
## 2022-07-15 ENCOUNTER — Ambulatory Visit: Payer: 59 | Admitting: Professional

## 2022-07-22 ENCOUNTER — Encounter: Payer: Self-pay | Admitting: Behavioral Health

## 2022-07-22 ENCOUNTER — Ambulatory Visit: Payer: 59 | Admitting: Behavioral Health

## 2022-07-22 DIAGNOSIS — F331 Major depressive disorder, recurrent, moderate: Secondary | ICD-10-CM

## 2022-07-22 DIAGNOSIS — F411 Generalized anxiety disorder: Secondary | ICD-10-CM

## 2022-07-22 NOTE — Progress Notes (Signed)
[facility]  Patient ID: Elizabeth [name],  MRN: [id number]  PCP: Hali Marry, MD  Date of Evaluation: 07/22/2022 Time spent:20 minutes  Chief Complaint:  Chief Complaint   Anxiety; Depression; Follow-up; Medication Refill; Patient Education     HISTORY/CURRENT STATUS: HPI  62 year old female presents to this office for follow up and medication management.  No changes this visit. She says that she is feeling very well right now. She feels stable with decreased anxiety and depression. She reports that she continues with psychotherapy. She also is participating in Acupuncture.   No medication changes are indicated at this time.  She reports decreased anxiety at 2/10 and depression at 1/10. She is sleeping 6- 7  hours per night. . No mania, no psychosis, no SI/HI.   Past Psychiatric medication failures: Prozac Paxil Cymbalta Effexor Wellbutrin       Individual Medical History/ Review of Systems: Changes? :No   Allergies: Dextromethorphan-guaifenesin, Fluoxetine hcl, and Wellbutrin [bupropion]  Current Medications:  Current Outpatient Medications:    ALPRAZolam (XANAX) 0.25 MG tablet, Take 1 tablet (0.25 mg total) by mouth at bedtime as needed for anxiety., Disp: 30 tablet, Rfl: 0   Ascorbic Acid (VITAMIN C) 1000 MG tablet, Take 1 tablet by mouth daily., Disp: , Rfl:    busPIRone (BUSPAR) 15 MG tablet, TAKE 1 TABLET BY MOUTH TWICE A DAY, Disp: 60 tablet, Rfl: 3   CAFFEINE PO, Take 100 mg by mouth daily., Disp: , Rfl:    Cholecalciferol (VITAMIN D-3 PO), Take 2,000 Units by mouth daily., Disp: , Rfl:    Coenzyme Q10 (CO Q 10) 100 MG CAPS, Take 100 mg by mouth daily., Disp: , Rfl:    diphenhydrAMINE (BENADRYL ALLERGY CHILDRENS) 12.5 MG chewable tablet, Chew 12.5 mg by mouth every evening., Disp: , Rfl:    EPINEPHrine (EPIPEN 2-PAK) 0.3 mg/0.3 mL IJ SOAJ injection, Inject 0.3 mg into the muscle as needed for anaphylaxis., Disp: 1 each, Rfl: 1   ferrous  sulfate 325 (65 FE) MG tablet, Take 325 mg by mouth See admin instructions. Tuesday, Thursday, Saturday and sunday, Disp: , Rfl:    fexofenadine (ALLEGRA) 180 MG tablet, Take 180 mg by mouth every morning., Disp: , Rfl:    gabapentin (NEURONTIN) 800 MG tablet, Take 1 tablet (800 mg total) by mouth at bedtime., Disp: 90 tablet, Rfl: 3   Glucosamine 750 MG TABS, Take 1 tablet by mouth 2 (two) times daily., Disp: , Rfl:    Multiple Vitamin (MULTIVITAMIN) tablet, Take 1 tablet by mouth daily., Disp: , Rfl:    norethindrone-ethinyl estradiol (FEMHRT 1/5) 1-5 MG-MCG TABS tablet, Take 1 tablet by mouth at bedtime., Disp: , Rfl:    Omega-3 Fatty Acids (FISH OIL) 645 MG CAPS, Take 1 Capful by mouth. Tuesday, Thursday, Saturday, Sunday, Disp: , Rfl:    sertraline (ZOLOFT) 100 MG tablet, TAKE 1 TABLET BY MOUTH DAILY, Disp: 30 tablet, Rfl: 3   vitamin E 180 MG (400 UNITS) capsule, Take 400 Units by mouth daily., Disp: , Rfl:  Medication Side Effects: none  Family Medical/ Social History: Changes? No  MENTAL HEALTH EXAM:  There were no vitals taken for this visit.There is no height or weight on file to calculate BMI.  General Appearance: Casual, Neat, and Well Groomed  Eye Contact:  Good  Speech:  Clear and Coherent  Volume:  Normal  Mood:  NA  Affect:  Appropriate  Thought Process:  Coherent  Orientation:  Full (Time, Place, and  Person)  Thought Content: Logical   Suicidal Thoughts:  No  Homicidal Thoughts:  No  Memory:  WNL  Judgement:  Good  Insight:  Good  Psychomotor Activity:  Normal  Concentration:  Concentration: Good  Recall:  Good  Fund of Knowledge: Good  Language: Good  Assets:  Desire for Improvement  ADL's:  Intact  Cognition: WNL  Prognosis:  Good    DIAGNOSES: No diagnosis found.  Receiving Psychotherapy: No    RECOMMENDATIONS:   No changes this visit Continue  Zoloft 100 mg daily Continue Buspar 15 mg twice daily Will report side affects or worsening symptoms  promptly Will follow up in 5 months to reassess Greater than 50% of 30 min face to face time with patient was spent on counseling and coordination of care.  We discussed her current stability  with  anxiety and depression. Patient agreed to DC zoloft if rash develops. She will continue psychotherapy. Emergency contact and after hours information provided. Patient verbally contracted for safety. Refills escribed to patient pharmacy.    [name], NP

## 2022-08-09 ENCOUNTER — Other Ambulatory Visit: Payer: Self-pay | Admitting: Behavioral Health

## 2022-08-09 DIAGNOSIS — F411 Generalized anxiety disorder: Secondary | ICD-10-CM

## 2022-08-09 DIAGNOSIS — F331 Major depressive disorder, recurrent, moderate: Secondary | ICD-10-CM

## 2022-09-02 ENCOUNTER — Ambulatory Visit: Payer: 59 | Admitting: Professional

## 2022-09-03 IMAGING — US US ABDOMEN LIMITED RUQ/ASCITES
1 series · 14 of 25 positions shown · non-contrast
Comparison: Abdominopelvic CT earlier today.

CLINICAL DATA: Right upper quadrant pain.

EXAM:
ULTRASOUND ABDOMEN LIMITED RIGHT UPPER QUADRANT

[Series 1: us abdomen limited ruq/ascites · 14 of 88 slices shown]
[im 1/88]
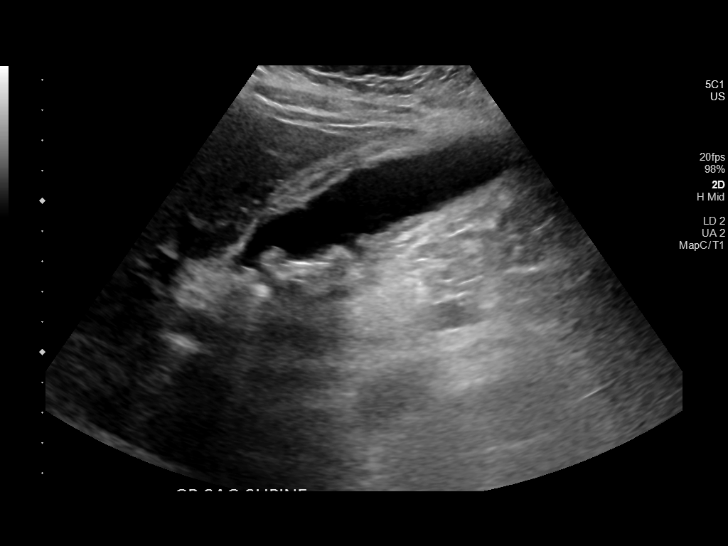
[im 8/88]
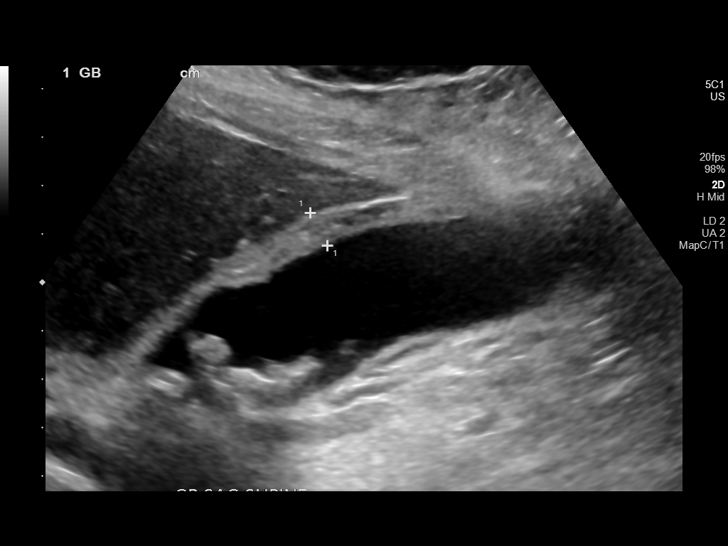
[im 15/88]
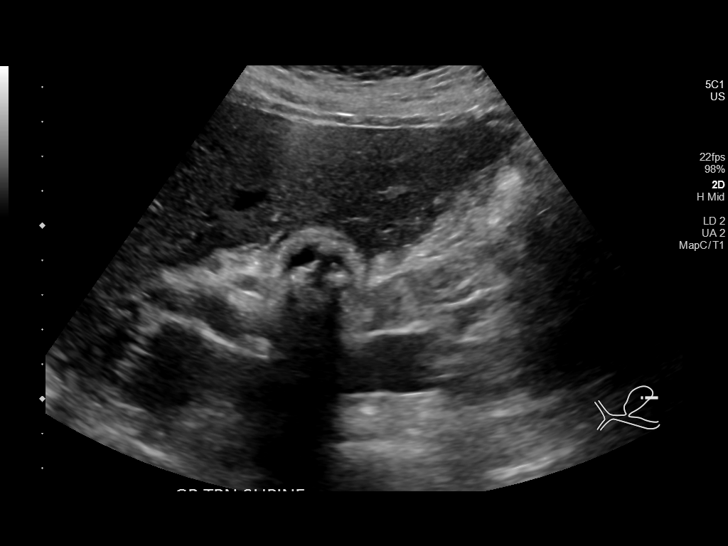
[im 22/88]
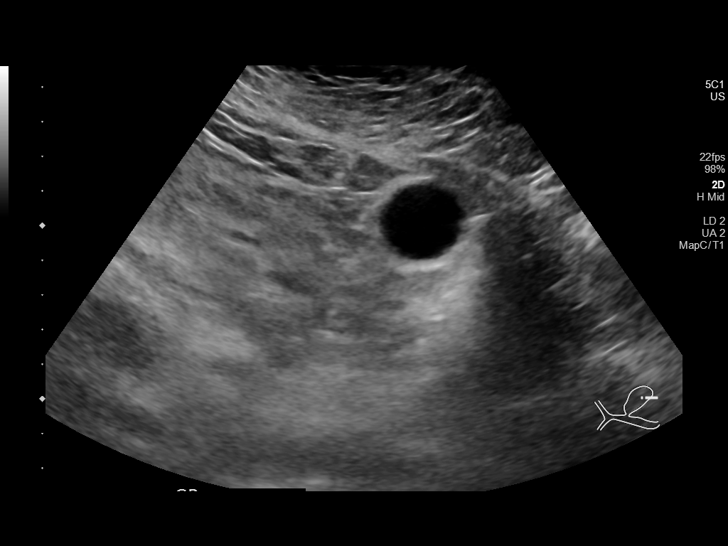
[im 30/88]
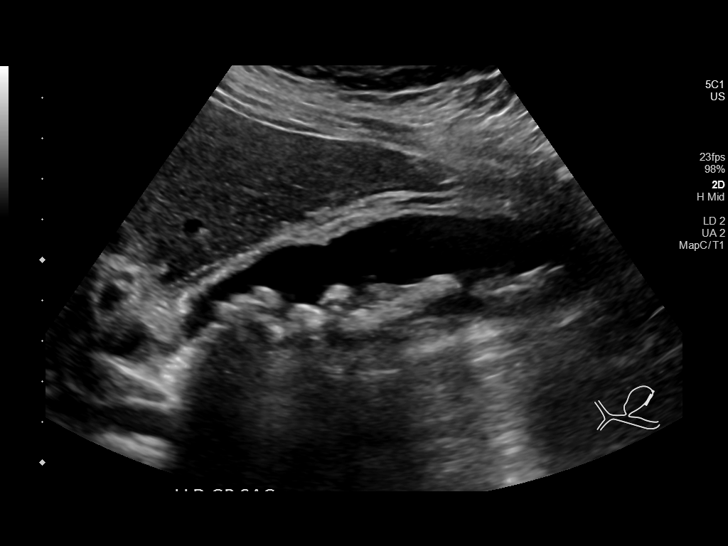
[im 33/88]
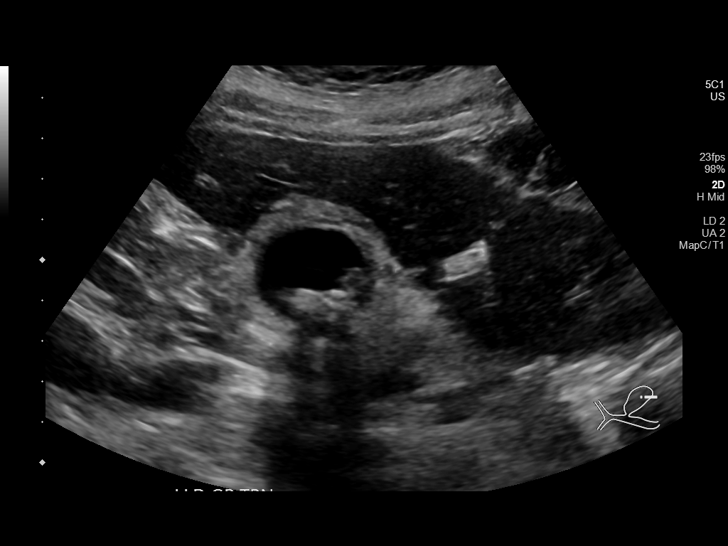
[im 40/88]
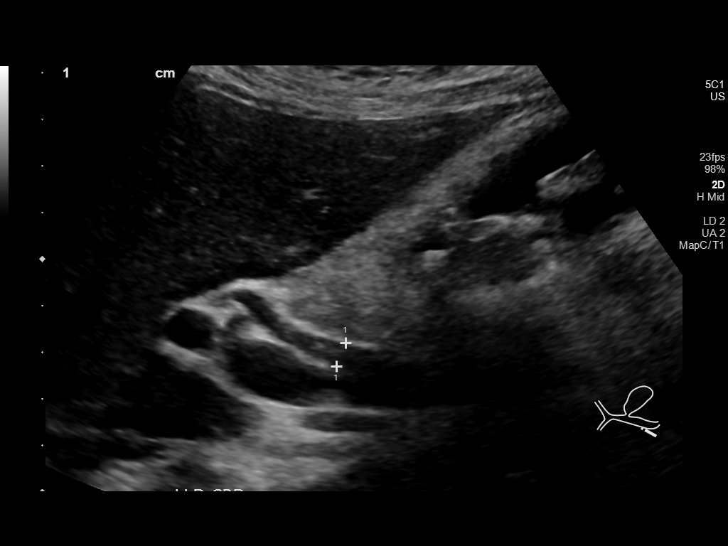
[im 48/88]
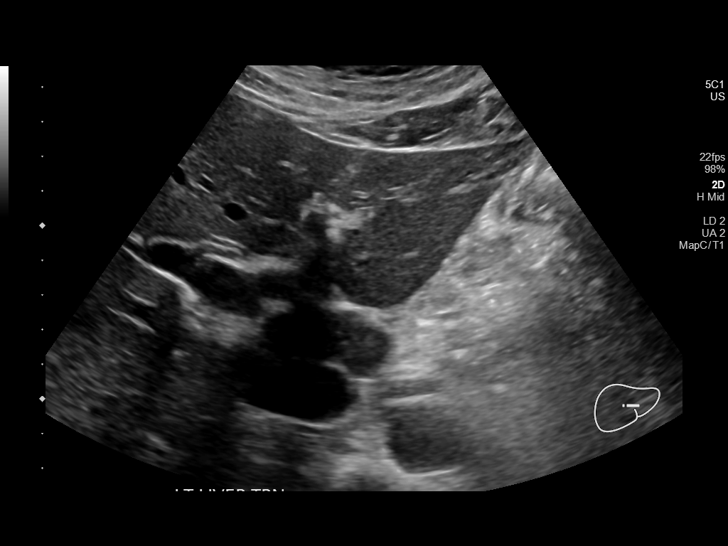
[im 55/88]
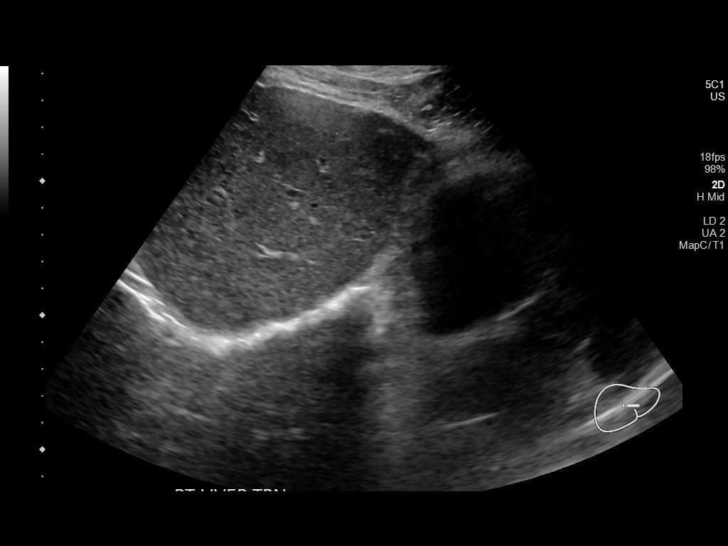
[im 59/88]
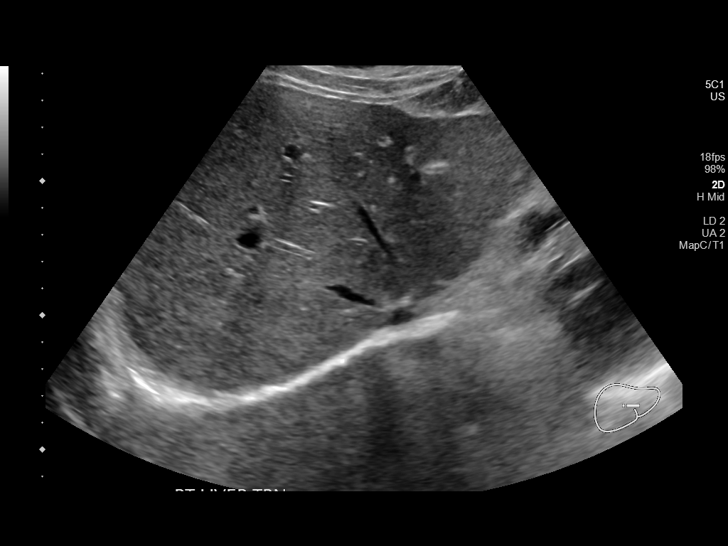
[im 66/88]
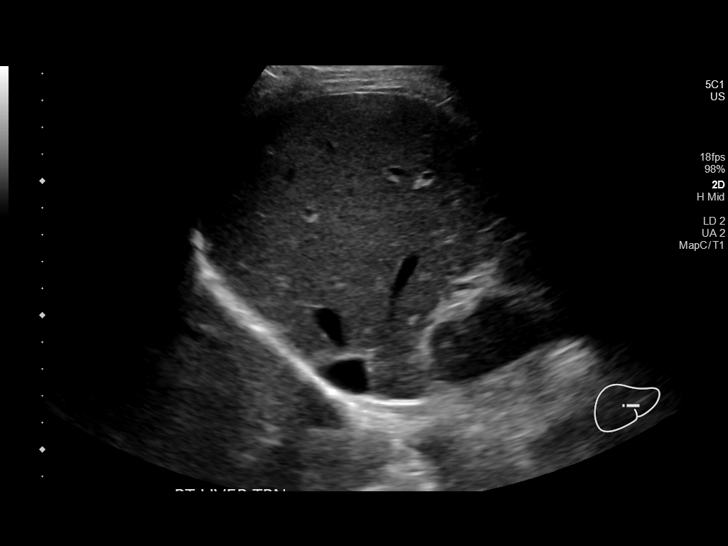
[im 73/88]
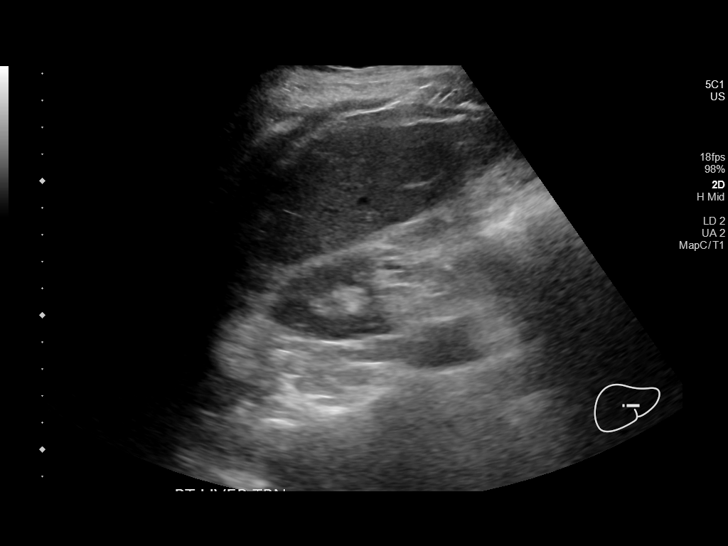
[im 80/88]
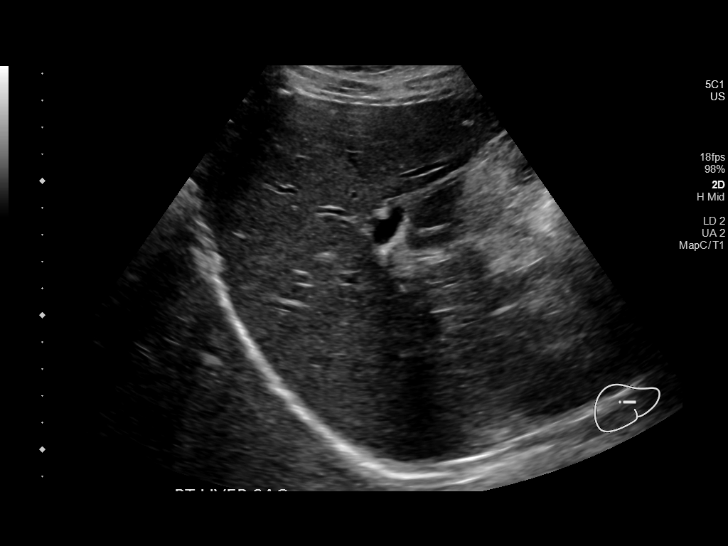
[im 88/88]
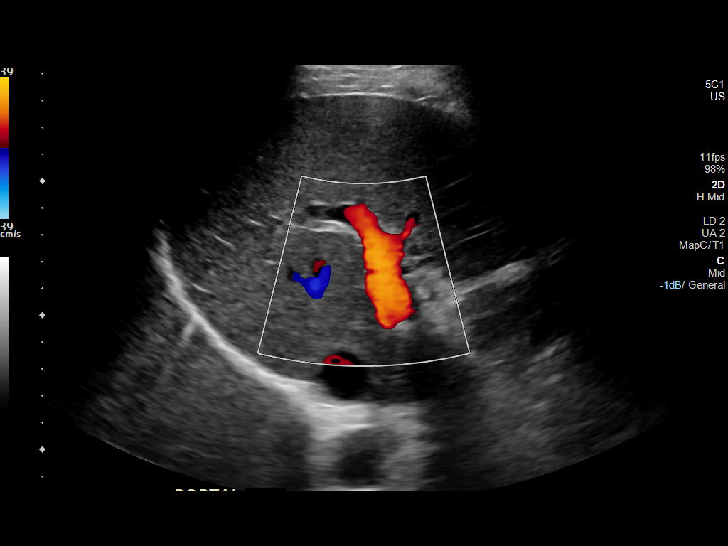

[14 of 25 positions shown; findings below may reference images not displayed]

FINDINGS: Gallbladder:

Physiologically distended. Multiple intraluminal gallstones. There
is diffuse gallbladder wall thickening up to 8 mm. Small amount of
pericholecystic fluid. No sonographic Murphy sign noted by
sonographer.

Common bile duct:

Diameter: 5 mm.  No evidence of choledocholithiasis.

Liver:

Tiny cyst in the left lobe on prior CT not well seen by ultrasound.
Within normal limits in parenchymal echogenicity. Portal vein is
patent on color Doppler imaging with normal direction of blood flow
towards the liver.

Other: None.
IMPRESSION: 1. Sonographic findings suspicious for acute cholecystitis.
Gallstones with diffuse gallbladder wall thickening and trace
pericholecystic fluid.
2. No biliary dilatation

## 2022-09-16 ENCOUNTER — Encounter: Payer: Self-pay | Admitting: Professional

## 2022-09-16 ENCOUNTER — Ambulatory Visit (INDEPENDENT_AMBULATORY_CARE_PROVIDER_SITE_OTHER): Payer: 59 | Admitting: Professional

## 2022-09-16 DIAGNOSIS — F331 Major depressive disorder, recurrent, moderate: Secondary | ICD-10-CM | POA: Diagnosis not present

## 2022-09-16 DIAGNOSIS — F411 Generalized anxiety disorder: Secondary | ICD-10-CM
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
## 2022-10-09 ENCOUNTER — Other Ambulatory Visit: Payer: Self-pay | Admitting: Family Medicine

## 2022-10-09 DIAGNOSIS — G2581 Restless legs syndrome: Secondary | ICD-10-CM

## 2022-10-14 ENCOUNTER — Ambulatory Visit: Payer: 59 | Admitting: Professional

## 2022-10-14 ENCOUNTER — Encounter: Payer: Self-pay | Admitting: Professional

## 2022-10-14 DIAGNOSIS — F331 Major depressive disorder, recurrent, moderate: Secondary | ICD-10-CM

## 2022-10-14 DIAGNOSIS — F411 Generalized anxiety disorder: Secondary | ICD-10-CM | POA: Diagnosis not present
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
## 2022-11-06 IMAGING — CT CT ABD-PELV W/O CM
2 of 4 series · 17 of 46 positions shown, 19 images · non-contrast
Comparison: 08/09/2020

CLINICAL DATA: Generalized abdominal pain. History of laparoscopic
cholecystectomy 2 months ago

EXAM:
CT ABDOMEN AND PELVIS WITHOUT CONTRAST
TECHNIQUE: Multidetector CT imaging of the abdomen and pelvis was performed
following the standard protocol without IV contrast.

[Series 4: coronal · coronal · 0.81mm/px · 3 of 97 slices shown]
[im 33/97  soft-tissue]
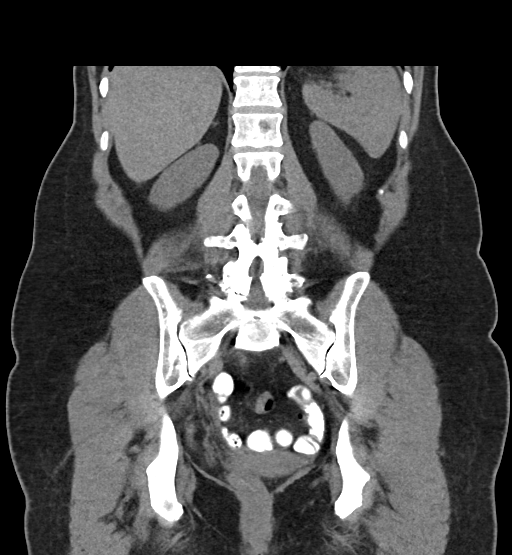
[im 43/97  soft-tissue]
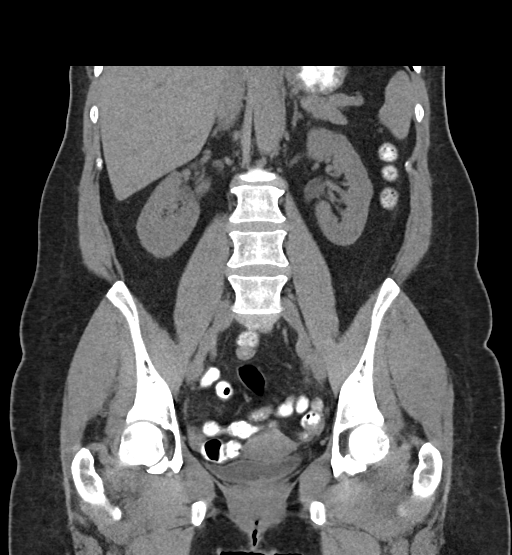
[im 54/97  soft-tissue]
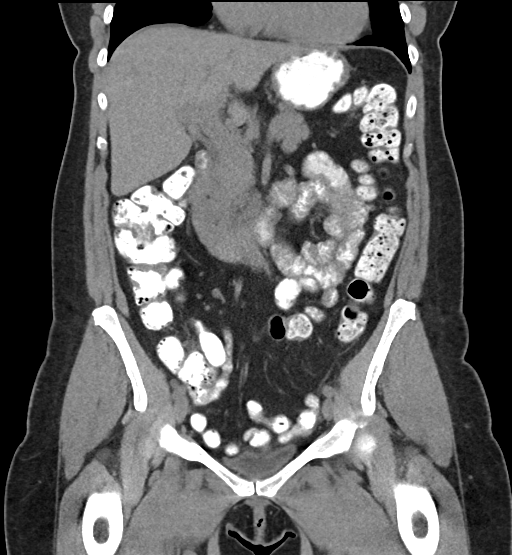

[Series 6: abd pel wo · axial · 0.69mm/px · z∈[+1001,+1411]mm · 14 of 90 slices shown, 16 images]
[im 4/90  soft-tissue]
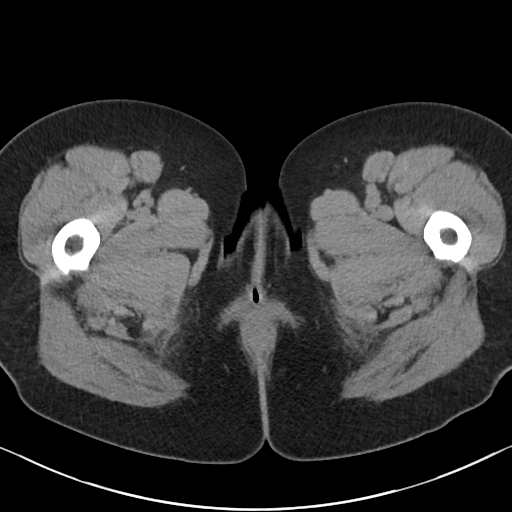
[im 4/90  bone]
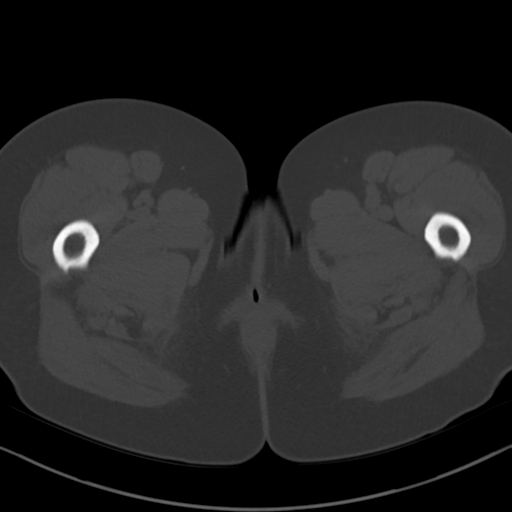
[im 11/90  soft-tissue]
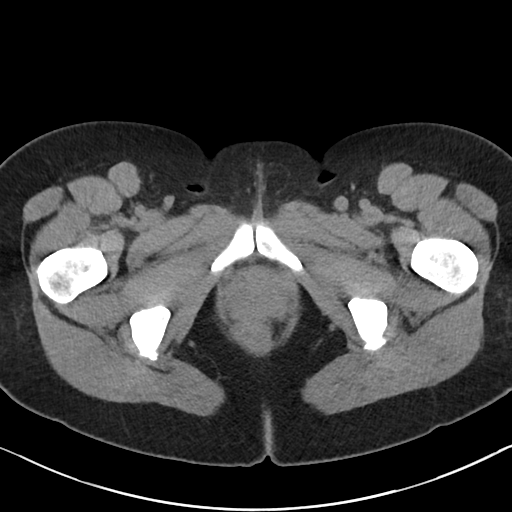
[im 18/90  soft-tissue]
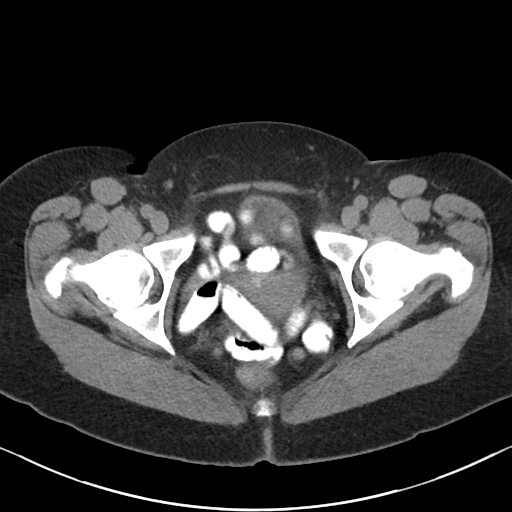
[im 24/90  soft-tissue]
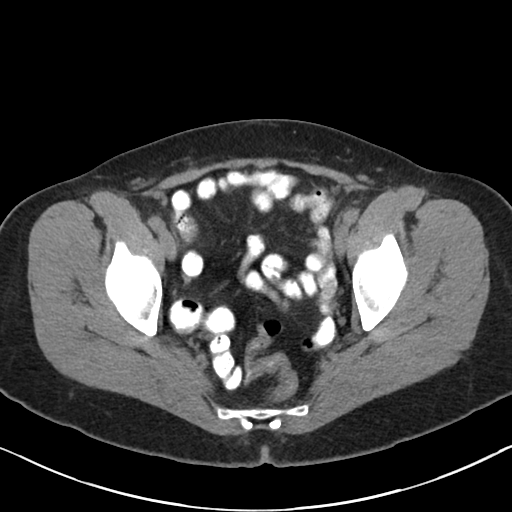
[im 31/90  soft-tissue]
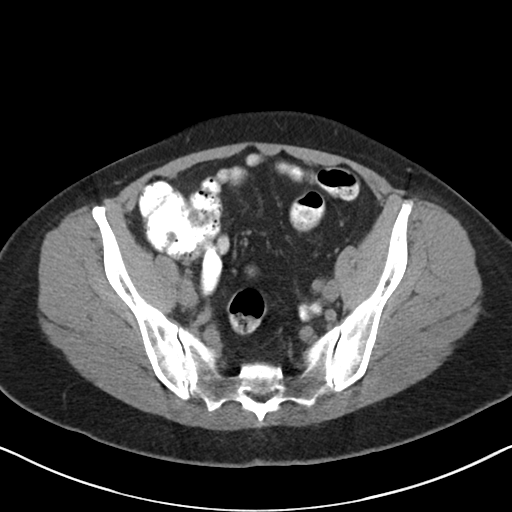
[im 35/90  soft-tissue]
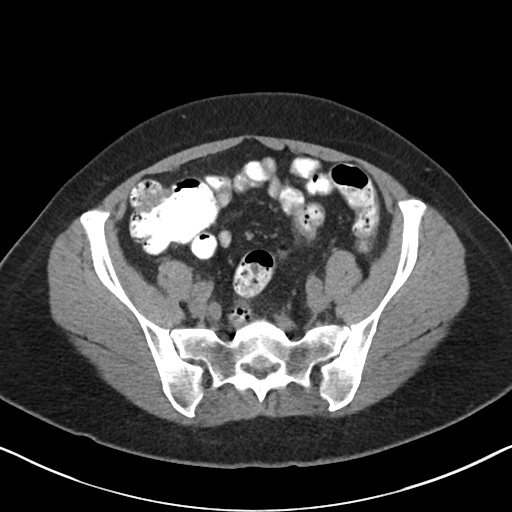
[im 42/90  soft-tissue]
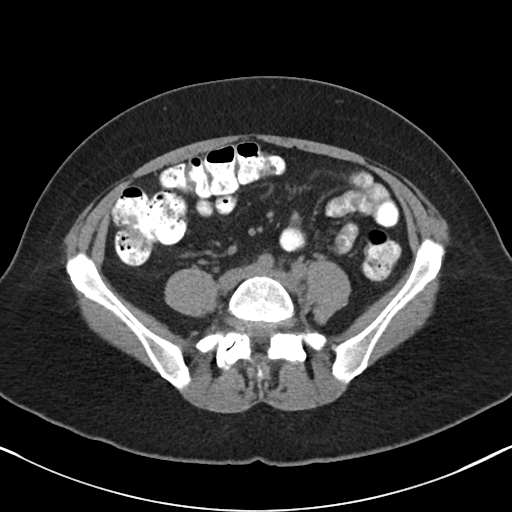
[im 48/90  soft-tissue]
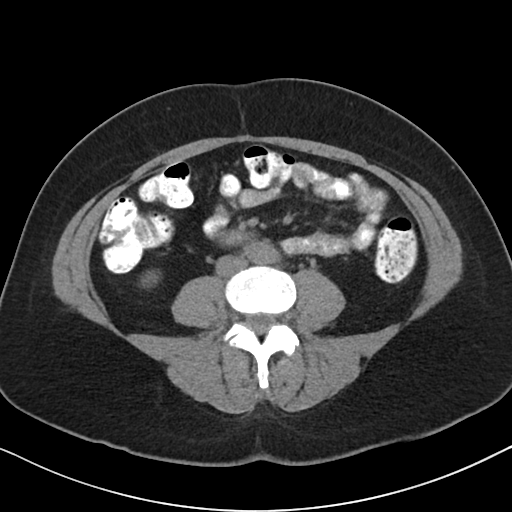
[im 55/90  soft-tissue]
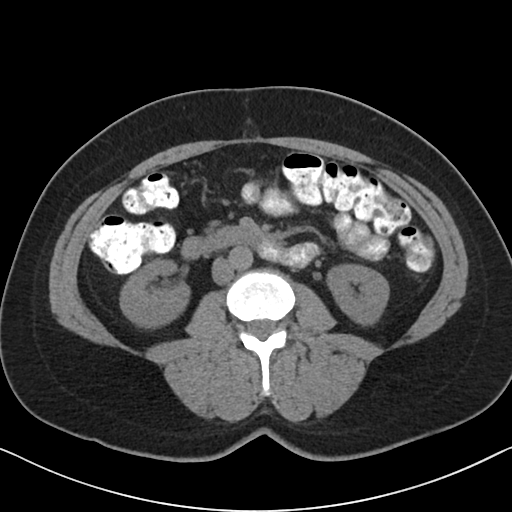
[im 55/90  bone]
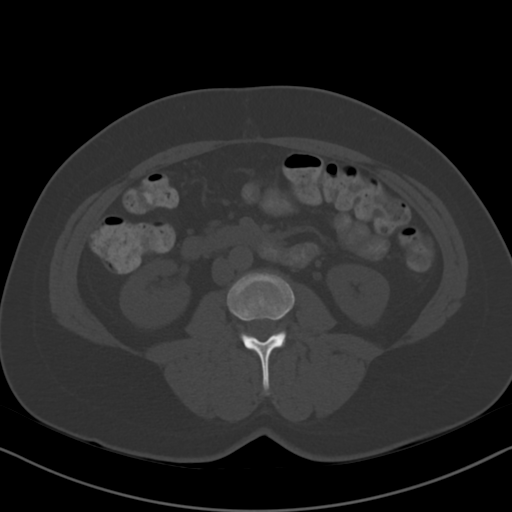
[im 59/90  soft-tissue]
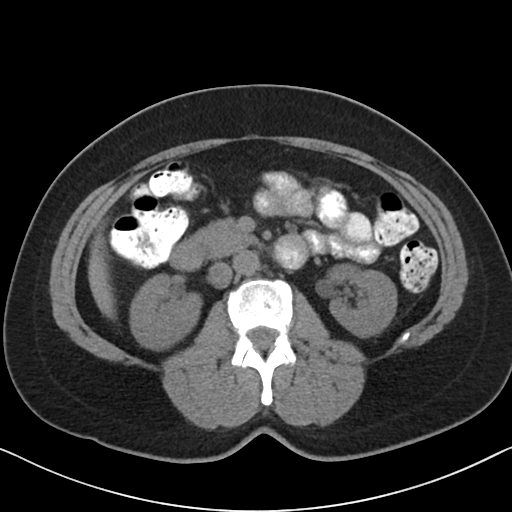
[im 66/90  soft-tissue]
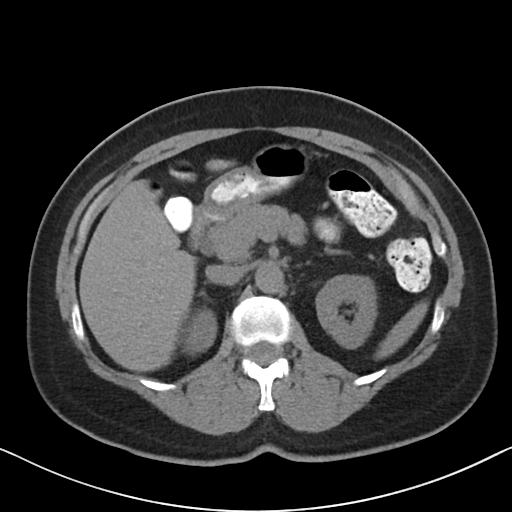
[im 72/90  soft-tissue]
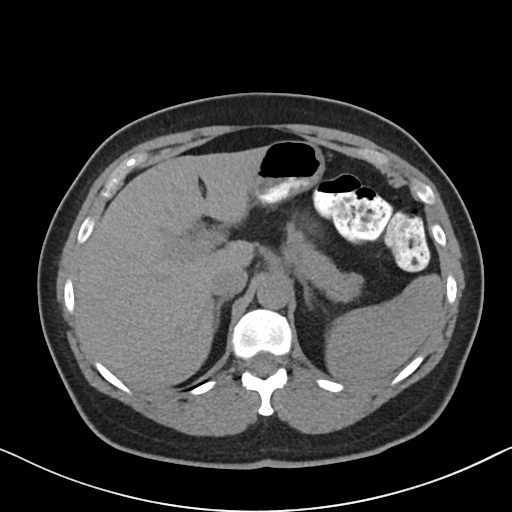
[im 79/90  soft-tissue]
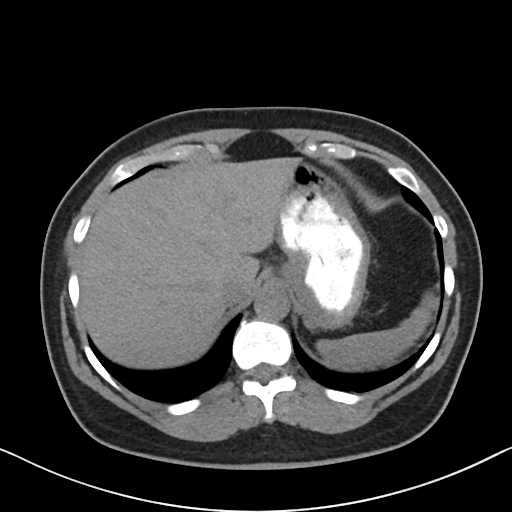
[im 86/90  soft-tissue]
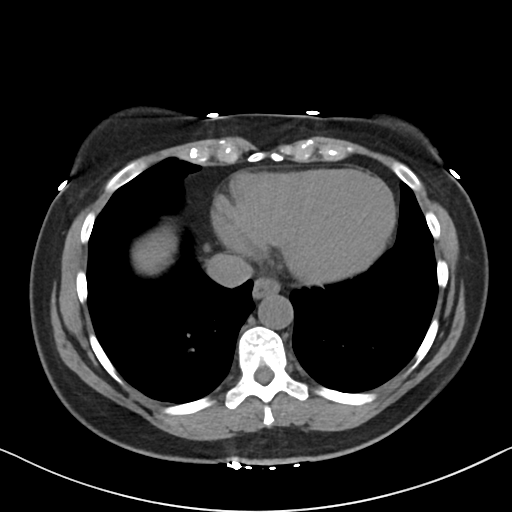

[17 of 46 positions shown; findings below may reference images not displayed]

FINDINGS: Lower chest: Included lung bases are clear.  Heart size is normal.

Hepatobiliary: Subcentimeter probable cyst at the left hepatic dome,
unchanged. Otherwise unremarkable unenhanced appearance of the
liver. Interval cholecystectomy. No fluid collections within the
gallbladder fossa.

Pancreas: Unremarkable. No pancreatic ductal dilatation or
surrounding inflammatory changes.

Spleen: Normal in size without focal abnormality.

Adrenals/Urinary Tract: Adrenal glands are unremarkable. Kidneys are
normal, without renal calculi, focal lesion, or hydronephrosis.
Bladder is unremarkable for the degree of distension.

Stomach/Bowel: Stomach and bowel are well distended with oral
contrast. Stomach is within normal limits. Appendix appears normal.
No evidence of bowel wall thickening, distention, or inflammatory
changes.

Vascular/Lymphatic: No significant vascular findings are evident on
noncontrast study. No enlarged abdominal or pelvic lymph nodes.

Reproductive: Probable small uterine fibroids. Bilateral adnexa are
unremarkable.

Other: No free fluid. No abdominopelvic fluid collection. No
pneumoperitoneum. No abdominal wall hernia.

Musculoskeletal: No acute or significant osseous findings.
Degenerative facet arthropathy of the lower lumbar spine.
IMPRESSION: 1. No acute abdominopelvic findings.
2. Interval cholecystectomy. No fluid collections within the
gallbladder fossa.

## 2022-11-11 ENCOUNTER — Encounter: Payer: Self-pay | Admitting: Professional

## 2022-11-11 ENCOUNTER — Ambulatory Visit: Payer: 59 | Admitting: Professional

## 2022-11-11 DIAGNOSIS — F411 Generalized anxiety disorder: Secondary | ICD-10-CM | POA: Diagnosis not present

## 2022-11-11 DIAGNOSIS — F331 Major depressive disorder, recurrent, moderate: Secondary | ICD-10-CM | POA: Diagnosis not present
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
## 2022-12-04 ENCOUNTER — Other Ambulatory Visit: Payer: Self-pay | Admitting: Behavioral Health

## 2022-12-04 DIAGNOSIS — F411 Generalized anxiety disorder: Secondary | ICD-10-CM

## 2022-12-04 DIAGNOSIS — F331 Major depressive disorder, recurrent, moderate: Secondary | ICD-10-CM

## 2022-12-08 ENCOUNTER — Encounter: Payer: Self-pay | Admitting: Family Medicine

## 2022-12-23 ENCOUNTER — Ambulatory Visit: Payer: 59 | Admitting: Professional

## 2022-12-30 ENCOUNTER — Ambulatory Visit (INDEPENDENT_AMBULATORY_CARE_PROVIDER_SITE_OTHER): Payer: 59 | Admitting: Professional

## 2022-12-30 ENCOUNTER — Encounter: Payer: Self-pay | Admitting: Professional

## 2022-12-30 DIAGNOSIS — F411 Generalized anxiety disorder: Secondary | ICD-10-CM | POA: Diagnosis not present

## 2022-12-30 DIAGNOSIS — F331 Major depressive disorder, recurrent, moderate: Secondary | ICD-10-CM
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
## 2023-01-08 DIAGNOSIS — J302 Other seasonal allergic rhinitis: Secondary | ICD-10-CM | POA: Diagnosis not present

## 2023-01-08 DIAGNOSIS — Z91018 Allergy to other foods: Secondary | ICD-10-CM | POA: Diagnosis not present

## 2023-01-08 DIAGNOSIS — L298 Other pruritus: Secondary | ICD-10-CM | POA: Diagnosis not present

## 2023-01-08 DIAGNOSIS — L508 Other urticaria: Secondary | ICD-10-CM | POA: Diagnosis not present

## 2023-01-20 ENCOUNTER — Encounter: Payer: Self-pay | Admitting: Behavioral Health

## 2023-01-20 ENCOUNTER — Ambulatory Visit: Payer: 59 | Admitting: Behavioral Health

## 2023-01-20 DIAGNOSIS — F411 Generalized anxiety disorder: Secondary | ICD-10-CM | POA: Diagnosis not present

## 2023-01-20 DIAGNOSIS — F331 Major depressive disorder, recurrent, moderate: Secondary | ICD-10-CM

## 2023-01-20 MED ORDER — BUSPIRONE HCL 15 MG PO TABS: 15.0000 mg | ORAL_TABLET | Freq: Two times a day (BID) | ORAL | 3 refills | Status: DC

## 2023-01-20 MED ORDER — SERTRALINE HCL 100 MG PO TABS
100.0000 mg | ORAL_TABLET | Freq: Every day | ORAL | 1 refills | Status: DC
Start: 2023-01-20 — End: 2023-07-14

## 2023-01-20 NOTE — Progress Notes (Signed)
[facility]  Patient ID: MAUDA MARKIN,  MRN: [id number]  PCP: Agapito Games, MD  Date of Evaluation: 01/20/2023 Time spent:30 minutes  Chief Complaint:  Chief Complaint   Depression; Anxiety; Follow-up; Medication Refill; Patient Education     HISTORY/CURRENT STATUS: HPI 62 year old female presents to this office for follow up and medication management.  No changes this visit. She says that she is feeling very well right now. She feels stable with decreased anxiety and depression. She reports that she continues with psychotherapy. She also is participating in Acupuncture.   No medication changes are indicated at this time.  She reports decreased anxiety at 2/10 and depression at 1/10. She is sleeping 6- 7  hours per night. . No mania, no psychosis, no SI/HI.   Past Psychiatric medication failures: Prozac Paxil Cymbalta Effexor Wellbutrin        Individual Medical History/ Review of Systems: Changes? :No   Allergies: Dextromethorphan-guaifenesin, Fluoxetine hcl, and Wellbutrin [bupropion]  Current Medications:  Current Outpatient Medications:    ALPRAZolam (XANAX) 0.25 MG tablet, Take 1 tablet (0.25 mg total) by mouth at bedtime as needed for anxiety., Disp: 30 tablet, Rfl: 0   Ascorbic Acid (VITAMIN C) 1000 MG tablet, Take 1 tablet by mouth daily., Disp: , Rfl:    busPIRone (BUSPAR) 15 MG tablet, Take 1 tablet (15 mg total) by mouth 2 (two) times daily., Disp: 60 tablet, Rfl: 3   CAFFEINE PO, Take 100 mg by mouth daily., Disp: , Rfl:    Cholecalciferol (VITAMIN D-3 PO), Take 2,000 Units by mouth daily., Disp: , Rfl:    Coenzyme Q10 (CO Q 10) 100 MG CAPS, Take 100 mg by mouth daily., Disp: , Rfl:    diphenhydrAMINE (BENADRYL ALLERGY CHILDRENS) 12.5 MG chewable tablet, Chew 12.5 mg by mouth every evening., Disp: , Rfl:    EPINEPHrine (EPIPEN 2-PAK) 0.3 mg/0.3 mL IJ SOAJ injection, Inject 0.3 mg into the muscle as needed for anaphylaxis., Disp: 1  each, Rfl: 1   ferrous sulfate 325 (65 FE) MG tablet, Take 325 mg by mouth See admin instructions. Tuesday, Thursday, Saturday and sunday, Disp: , Rfl:    fexofenadine (ALLEGRA) 180 MG tablet, Take 180 mg by mouth every morning., Disp: , Rfl:    gabapentin (NEURONTIN) 800 MG tablet, TAKE ONE TABLET BY MOUTH AT BEDTIME, Disp: 90 tablet, Rfl: 3   Glucosamine 750 MG TABS, Take 1 tablet by mouth 2 (two) times daily., Disp: , Rfl:    Multiple Vitamin (MULTIVITAMIN) tablet, Take 1 tablet by mouth daily., Disp: , Rfl:    norethindrone-ethinyl estradiol (FEMHRT 1/5) 1-5 MG-MCG TABS tablet, Take 1 tablet by mouth at bedtime., Disp: , Rfl:    Omega-3 Fatty Acids (FISH OIL) 645 MG CAPS, Take 1 Capful by mouth. Tuesday, Thursday, Saturday, Sunday, Disp: , Rfl:    sertraline (ZOLOFT) 100 MG tablet, Take 1 tablet (100 mg total) by mouth daily., Disp: 90 tablet, Rfl: 1   vitamin E 180 MG (400 UNITS) capsule, Take 400 Units by mouth daily., Disp: , Rfl:  Medication Side Effects: none  Family Medical/ Social History: Changes? No  MENTAL HEALTH EXAM:  There were no vitals taken for this visit.There is no height or weight on file to calculate BMI.  General Appearance: Casual, Neat, and Well Groomed  Eye Contact:  Good  Speech:  Clear and Coherent  Volume:  Normal  Mood:  NA  Affect:  Appropriate  Thought Process:  Coherent  Orientation:  Full (Time, Place, and Person)  Thought Content: Logical   Suicidal Thoughts:  No  Homicidal Thoughts:  No  Memory:  WNL  Judgement:  Good  Insight:  Good  Psychomotor Activity:  Normal  Concentration:  Concentration: Good  Recall:  Good  Fund of Knowledge: Good  Language: Good  Assets:  Desire for Improvement  ADL's:  Intact  Cognition: WNL  Prognosis:  Good    DIAGNOSES:    ICD-10-CM   1. Major depressive disorder, recurrent episode, moderate (HCC)  F33.1 sertraline (ZOLOFT) 100 MG tablet    2. Generalized anxiety disorder  F41.1 busPIRone (BUSPAR) 15 MG  tablet    sertraline (ZOLOFT) 100 MG tablet      Receiving Psychotherapy: yes   RECOMMENDATIONS:   No changes this visit Continue  Zoloft 100 mg daily Continue Buspar 15 mg twice daily Will report side affects or worsening symptoms promptly Will follow up in 6 months to reassess Greater than 50% of 30 min face to face time with patient was spent on counseling and coordination of care.  We discussed her current stability  with  anxiety and depression. Patient agreed to DC zoloft if rash develops. She will continue psychotherapy. Emergency contact and after hours information provided. Patient verbally contracted for safety. Refills escribed to patient pharmacy.            Joan Flores, NP

## 2023-02-19 ENCOUNTER — Encounter: Payer: Self-pay | Admitting: Family Medicine

## 2023-02-19 ENCOUNTER — Ambulatory Visit (INDEPENDENT_AMBULATORY_CARE_PROVIDER_SITE_OTHER): Payer: 59 | Admitting: Family Medicine

## 2023-02-19 VITALS — BP 130/68 | HR 48 | Ht 64.0 in | Wt 163.0 lb

## 2023-02-19 DIAGNOSIS — G2581 Restless legs syndrome: Secondary | ICD-10-CM

## 2023-02-19 DIAGNOSIS — L508 Other urticaria: Secondary | ICD-10-CM | POA: Insufficient documentation

## 2023-02-19 DIAGNOSIS — Z0000 Encounter for general adult medical examination without abnormal findings: Secondary | ICD-10-CM | POA: Diagnosis not present

## 2023-02-19 DIAGNOSIS — R197 Diarrhea, unspecified: Secondary | ICD-10-CM | POA: Diagnosis not present

## 2023-02-19 MED ORDER — GABAPENTIN 600 MG PO TABS
600.0000 mg | ORAL_TABLET | Freq: Every day | ORAL | 0 refills | Status: DC
Start: 2023-02-19 — End: 2023-10-13

## 2023-02-19 NOTE — Progress Notes (Signed)
Complete physical exam  Patient: Elizabeth [name]   DOB: September 08, 1960   62 y.o. Female  MRN: [id number]  Subjective:    Chief Complaint  Patient presents with   Annual Exam    Elizabeth [name] is a 62 y.o. female who presents today for a complete physical exam. She reports consuming a general diet.  Stay active.   She generally feels well. She reports sleeping fairly well. She does not have additional problems to discuss today.   Has noticed her rash seem to align with her bowel issues.     Most recent fall risk assessment:    02/19/2023    9:02 AM  Fall Risk   Falls in the past year? 0  Number falls in past yr: 0  Injury with Fall? 0  Risk for fall due to : No Fall Risks  Follow up Falls evaluation completed     Most recent depression screenings:    02/19/2023    9:02 AM 02/13/2022    9:19 AM  PHQ 2/9 Scores  PHQ - 2 Score 0 0  PHQ- 9 Score 1 2        Patient Care Team: Agapito Games, MD as PCP - General (Family Medicine) Aggie Cosier, MD as Referring Physician (Neurology) Arlana Lindau, NP as Referring Physician (Obstetrics and Gynecology) Karie Soda, MD as Consulting Physician (General Surgery)   Outpatient Medications Prior to Visit  Medication Sig   Ascorbic Acid (VITAMIN C) 1000 MG tablet Take 1 tablet by mouth daily.   busPIRone (BUSPAR) 15 MG tablet Take 1 tablet (15 mg total) by mouth 2 (two) times daily.   CAFFEINE PO Take 100 mg by mouth daily.   Cholecalciferol (VITAMIN D-3 PO) Take 2,000 Units by mouth daily.   Coenzyme Q10 (CO Q 10) 100 MG CAPS Take 100 mg by mouth daily.   ELDERBERRY PO Take 150 mg by mouth daily.   EPINEPHrine (EPIPEN 2-PAK) 0.3 mg/0.3 mL IJ SOAJ injection Inject 0.3 mg into the muscle as needed for anaphylaxis.   famotidine (PEPCID) 10 MG tablet Take 10 mg by mouth 2 (two) times daily.   ferrous sulfate 325 (65 FE) MG tablet Take 325 mg by mouth See admin instructions. Tuesday, Thursday, Saturday and sunday    fexofenadine (ALLEGRA) 180 MG tablet Take 180 mg by mouth every morning.   Glucosamine 750 MG TABS Take 1 tablet by mouth 2 (two) times daily.   loratadine (CLARITIN) 10 MG tablet Take 10 mg by mouth daily.   Multiple Vitamin (MULTIVITAMIN) tablet Take 1 tablet by mouth daily.   norethindrone-ethinyl estradiol (FEMHRT LOW DOSE) 0.5-2.5 MG-MCG tablet Take 1 tablet by mouth daily.   sertraline (ZOLOFT) 100 MG tablet Take 1 tablet (100 mg total) by mouth daily.   vitamin E 180 MG (400 UNITS) capsule Take 400 Units by mouth daily.   [DISCONTINUED] gabapentin (NEURONTIN) 800 MG tablet TAKE ONE TABLET BY MOUTH AT BEDTIME   [DISCONTINUED] norethindrone-ethinyl estradiol (FEMHRT 1/5) 1-5 MG-MCG TABS tablet Take 1 tablet by mouth at bedtime.   [DISCONTINUED] ALPRAZolam (XANAX) 0.25 MG tablet Take 1 tablet (0.25 mg total) by mouth at bedtime as needed for anxiety.   [DISCONTINUED] diphenhydrAMINE (BENADRYL ALLERGY CHILDRENS) 12.5 MG chewable tablet Chew 12.5 mg by mouth every evening.   [DISCONTINUED] Omega-3 Fatty Acids (FISH OIL) 645 MG CAPS Take 1 Capful by mouth. Tuesday, Thursday, Saturday, Sunday   No facility-administered medications prior to visit.    ROS  Objective:     BP 130/68   Pulse (!) 48   Ht 5\' 4"  (1.626 m)   Wt 163 lb (73.9 kg)   SpO2 100%   BMI 27.98 kg/m     Physical Exam Vitals and nursing note reviewed.  Constitutional:      Appearance: Normal appearance.  HENT:     Head: Normocephalic and atraumatic.  Eyes:     Conjunctiva/sclera: Conjunctivae normal.  Cardiovascular:     Rate and Rhythm: Normal rate and regular rhythm.  Pulmonary:     Effort: Pulmonary effort is normal.     Breath sounds: Normal breath sounds.  Skin:    General: Skin is warm and dry.  Neurological:     Mental Status: She is alert.  Psychiatric:        Mood and Affect: Mood normal.      No results found for any visits on 02/19/23.      Assessment & Plan:    Routine  Health Maintenance and Physical Exam  Immunization History  Administered Date(s) Administered   Influenza Inj Mdck Quad Pf 03/16/2019   Influenza Split 02/25/2012   Influenza Whole 03/14/2008, 03/22/2009, 04/04/2010   Influenza,inj,Quad PF,6+ Mos 02/18/2013   Influenza-Unspecified 04/11/2016, 03/16/2019   PFIZER(Purple Top)SARS-COV-2 Vaccination 07/30/2019, 08/24/2019, 04/29/2020   Pfizer Covid-19 Vaccine Bivalent Booster 63yrs & up 01/25/2021   Td 09/27/2002   Tdap 08/07/2011, 02/13/2022   Zoster Recombinant(Shingrix) 12/07/2022   Zoster, Live 05/04/2015    Health Maintenance  Topic Date Due   Cervical Cancer Screening (HPV/Pap Cotest)  04/02/2020   Zoster Vaccines- Shingrix (2 of 2) 05/21/2023 (Originally 02/01/2023)   INFLUENZA VACCINE  08/25/2023 (Originally 12/26/2022)   COVID-19 Vaccine (5 - 2023-24 season) 03/06/2024 (Originally 01/26/2023)   MAMMOGRAM  05/03/2023   Colonoscopy  02/24/2024   DTaP/Tdap/Td (4 - Td or Tdap) 02/14/2032   Hepatitis C Screening  Completed   HIV Screening  Completed   Pneumococcal Vaccine 78-74 Years old  Aged Out   HPV VACCINES  Aged Out    Discussed health benefits of physical activity, and encouraged her to engage in regular exercise appropriate for her age and condition.  Problem List Items Addressed This Visit       Musculoskeletal and Integument   Chronic urticaria     Other   RLS (restless legs syndrome)   Relevant Medications   gabapentin (NEURONTIN) 600 MG tablet   Other Visit Diagnoses     Wellness examination    -  Primary   Relevant Orders   Lipid Panel With LDL/HDL Ratio   HgB A1c   [id number]   Celiac Disease Panel   CBC with Differential/Platelet   Diarrhea, unspecified type       Relevant Orders   Lipid Panel With LDL/HDL Ratio   HgB A1c   [id number]   Celiac Disease Panel   CBC with Differential/Platelet       Keep up a regular exercise program and make sure you are eating a healthy diet Try to eat 4  servings of dairy a day, or if you are lactose intolerant take a calcium with vitamin D daily.  Your vaccines are up to date.   Return in about 1 year (around 02/19/2024) for Wellness Exam.     Nani Gasser, MD

## 2023-02-20 LAB — CBC WITH DIFFERENTIAL/PLATELET
Basophils Absolute: 0.1 10*3/uL (ref 0.0–0.2)
Basos: 1 %
EOS (ABSOLUTE): 0.2 10*3/uL (ref 0.0–0.4)
Eos: 3 %
Hematocrit: 43.3 % (ref 34.0–46.6)
Hemoglobin: 13.9 g/dL (ref 11.1–15.9)
Immature Grans (Abs): 0 10*3/uL (ref 0.0–0.1)
Immature Granulocytes: 0 %
Lymphocytes Absolute: 1.9 10*3/uL (ref 0.7–3.1)
Lymphs: 34 %
MCH: 30.8 pg (ref 26.6–33.0)
MCHC: 32.1 g/dL (ref 31.5–35.7)
MCV: 96 fL (ref 79–97)
Monocytes Absolute: 0.5 10*3/uL (ref 0.1–0.9)
Monocytes: 9 %
Neutrophils Absolute: 3 10*3/uL (ref 1.4–7.0)
Neutrophils: 53 %
Platelets: 202 10*3/uL (ref 150–450)
RBC: 4.51 x10E6/uL (ref 3.77–5.28)
RDW: 11.9 % (ref 11.7–15.4)
WBC: 5.5 10*3/uL (ref 3.4–10.8)

## 2023-02-20 LAB — CMP14+EGFR
ALT: 20 IU/L (ref 0–32)
AST: 20 IU/L (ref 0–40)
Albumin: 4.8 g/dL (ref 3.8–4.9)
Alkaline Phosphatase: 66 IU/L (ref 44–121)
BUN/Creatinine Ratio: 15 (ref 12–28)
BUN: 16 mg/dL (ref 8–27)
Bilirubin Total: 0.8 mg/dL (ref 0.0–1.2)
CO2: 21 mmol/L (ref 20–29)
Calcium: 9.5 mg/dL (ref 8.7–10.3)
Chloride: 106 mmol/L (ref 96–106)
Creatinine, Ser: 1.06 mg/dL — ABNORMAL HIGH (ref 0.57–1.00)
Globulin, Total: 2 g/dL (ref 1.5–4.5)
Glucose: 91 mg/dL (ref 70–99)
Potassium: 4.1 mmol/L (ref 3.5–5.2)
Sodium: 143 mmol/L (ref 134–144)
Total Protein: 6.8 g/dL (ref 6.0–8.5)
eGFR: 60 mL/min/{1.73_m2} (ref >59)

## 2023-02-20 LAB — LIPID PANEL WITH LDL/HDL RATIO
Cholesterol, Total: 185 mg/dL (ref 100–199)
HDL: 59 mg/dL (ref >39)
LDL Chol Calc (NIH): 113 mg/dL — ABNORMAL HIGH (ref 0–99)
LDL/HDL Ratio: 1.9 ratio (ref 0.0–3.2)
Triglycerides: 68 mg/dL (ref 0–149)
VLDL Cholesterol Cal: 13 mg/dL (ref 5–40)

## 2023-02-20 LAB — CELIAC DISEASE PANEL
Endomysial IgA: NEGATIVE
IgA/Immunoglobulin A, Serum: 114 mg/dL (ref 87–352)

## 2023-02-20 LAB — HEMOGLOBIN A1C
Est. average glucose Bld gHb Est-mCnc: 111 mg/dL
Hgb A1c MFr Bld: 5.5 % (ref 4.8–5.6)

## 2023-02-21 NOTE — Progress Notes (Signed)
Hi Elizabeth [name], LDL is just a little elevated at 113.  Goal less than 100.  Just continue to work on healthy diet and regular exercise.  Kidney function is stable around 1.0.  Looks like you can a balance between 0.8 and 1.0 so I think were pretty stable there.  Liver function looks great.  A1c is normal no sign of diabetes.  Still awaiting the celiac test.  Blood count looks great.

## 2023-02-24 ENCOUNTER — Encounter: Payer: Self-pay | Admitting: Professional

## 2023-02-24 ENCOUNTER — Encounter: Payer: Self-pay | Admitting: Family Medicine

## 2023-02-24 ENCOUNTER — Ambulatory Visit (INDEPENDENT_AMBULATORY_CARE_PROVIDER_SITE_OTHER): Payer: 59 | Admitting: Professional

## 2023-02-24 DIAGNOSIS — F411 Generalized anxiety disorder: Secondary | ICD-10-CM | POA: Diagnosis not present

## 2023-02-24 DIAGNOSIS — F331 Major depressive disorder, recurrent, moderate: Secondary | ICD-10-CM | POA: Diagnosis not present
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
## 2023-02-24 NOTE — Progress Notes (Signed)
Celiac test is negative.  This does not still rule out a sensitivity to gluten but you do not have celiac which is reassuring.

## 2023-03-10 ENCOUNTER — Encounter: Payer: Self-pay | Admitting: Family Medicine

## 2023-04-14 ENCOUNTER — Encounter: Payer: Self-pay | Admitting: Professional

## 2023-04-14 ENCOUNTER — Ambulatory Visit: Payer: 59 | Admitting: Professional

## 2023-04-14 DIAGNOSIS — F331 Major depressive disorder, recurrent, moderate: Secondary | ICD-10-CM

## 2023-04-14 DIAGNOSIS — F411 Generalized anxiety disorder: Secondary | ICD-10-CM
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
## 2023-05-12 ENCOUNTER — Encounter: Payer: Self-pay | Admitting: Family Medicine

## 2023-05-12 DIAGNOSIS — Z1231 Encounter for screening mammogram for malignant neoplasm of breast: Secondary | ICD-10-CM | POA: Diagnosis not present

## 2023-05-12 DIAGNOSIS — Z1331 Encounter for screening for depression: Secondary | ICD-10-CM | POA: Diagnosis not present

## 2023-05-12 DIAGNOSIS — Z01419 Encounter for gynecological examination (general) (routine) without abnormal findings: Secondary | ICD-10-CM | POA: Diagnosis not present

## 2023-05-12 LAB — HM MAMMOGRAPHY

## 2023-05-30 ENCOUNTER — Other Ambulatory Visit: Payer: Self-pay | Admitting: Behavioral Health

## 2023-05-30 DIAGNOSIS — F411 Generalized anxiety disorder: Secondary | ICD-10-CM

## 2023-06-02 ENCOUNTER — Encounter: Payer: Self-pay | Admitting: Professional

## 2023-06-02 ENCOUNTER — Ambulatory Visit (INDEPENDENT_AMBULATORY_CARE_PROVIDER_SITE_OTHER): Payer: Commercial Managed Care - HMO | Admitting: Professional

## 2023-06-02 DIAGNOSIS — F331 Major depressive disorder, recurrent, moderate: Secondary | ICD-10-CM

## 2023-06-02 DIAGNOSIS — F411 Generalized anxiety disorder: Secondary | ICD-10-CM
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
## 2023-07-14 ENCOUNTER — Encounter: Payer: Self-pay | Admitting: Behavioral Health

## 2023-07-14 ENCOUNTER — Ambulatory Visit (INDEPENDENT_AMBULATORY_CARE_PROVIDER_SITE_OTHER): Payer: Commercial Managed Care - HMO | Admitting: Behavioral Health

## 2023-07-14 DIAGNOSIS — F331 Major depressive disorder, recurrent, moderate: Secondary | ICD-10-CM | POA: Diagnosis not present

## 2023-07-14 DIAGNOSIS — F411 Generalized anxiety disorder: Secondary | ICD-10-CM

## 2023-07-14 MED ORDER — BUSPIRONE HCL 15 MG PO TABS
15.0000 mg | ORAL_TABLET | Freq: Two times a day (BID) | ORAL | 1 refills | Status: DC
Start: 2023-07-14 — End: 2023-08-16

## 2023-07-14 MED ORDER — SERTRALINE HCL 100 MG PO TABS
100.0000 mg | ORAL_TABLET | Freq: Every day | ORAL | 1 refills | Status: DC
Start: 2023-07-14 — End: 2023-08-16

## 2023-07-14 NOTE — Progress Notes (Signed)
[facility]  Patient ID: Elizabeth [name],  MRN: [id number]  PCP: Agapito Games, MD  Date of Evaluation: 07/14/2023 Time spent:30 minutes  Chief Complaint:  Chief Complaint   Anxiety; Depression; Follow-up; Medication Refill; Patient Education     HISTORY/CURRENT STATUS: HPI 63 year old female presents to this office for follow up and medication management.  No changes this visit. She says that she is feeling very well right now. She feels stable with decreased anxiety and depression. She reports that she continues with psychotherapy.   No medication changes are indicated at this time.  She reports decreased anxiety at 2/10 and depression at 1/10. She is sleeping 6- 7  hours per night. . No mania, no psychosis, no SI/HI.   Past Psychiatric medication failures: Prozac Paxil Cymbalta Effexor Wellbutrin   Individual Medical History/ Review of Systems: Changes? :No   Allergies: Dextromethorphan-guaifenesin, Fluoxetine hcl, and Wellbutrin [bupropion]  Current Medications:  Current Outpatient Medications:    Ascorbic Acid (VITAMIN C) 1000 MG tablet, Take 1 tablet by mouth daily., Disp: , Rfl:    busPIRone (BUSPAR) 15 MG tablet, Take 1 tablet (15 mg total) by mouth 2 (two) times daily., Disp: 180 tablet, Rfl: 1   CAFFEINE PO, Take 100 mg by mouth daily., Disp: , Rfl:    Cholecalciferol (VITAMIN D-3 PO), Take 2,000 Units by mouth daily., Disp: , Rfl:    Coenzyme Q10 (CO Q 10) 100 MG CAPS, Take 100 mg by mouth daily., Disp: , Rfl:    ELDERBERRY PO, Take 150 mg by mouth daily., Disp: , Rfl:    EPINEPHrine (EPIPEN 2-PAK) 0.3 mg/0.3 mL IJ SOAJ injection, Inject 0.3 mg into the muscle as needed for anaphylaxis., Disp: 1 each, Rfl: 1   famotidine (PEPCID) 10 MG tablet, Take 10 mg by mouth 2 (two) times daily., Disp: , Rfl:    ferrous sulfate 325 (65 FE) MG tablet, Take 325 mg by mouth See admin instructions. Tuesday, Thursday, Saturday and sunday, Disp: , Rfl:     fexofenadine (ALLEGRA) 180 MG tablet, Take 180 mg by mouth every morning., Disp: , Rfl:    gabapentin (NEURONTIN) 600 MG tablet, Take 1 tablet (600 mg total) by mouth at bedtime., Disp: 90 tablet, Rfl: 0   Glucosamine 750 MG TABS, Take 1 tablet by mouth 2 (two) times daily., Disp: , Rfl:    loratadine (CLARITIN) 10 MG tablet, Take 10 mg by mouth daily., Disp: , Rfl:    Multiple Vitamin (MULTIVITAMIN) tablet, Take 1 tablet by mouth daily., Disp: , Rfl:    norethindrone-ethinyl estradiol (FEMHRT LOW DOSE) 0.5-2.5 MG-MCG tablet, Take 1 tablet by mouth daily., Disp: , Rfl:    sertraline (ZOLOFT) 100 MG tablet, Take 1 tablet (100 mg total) by mouth daily., Disp: 90 tablet, Rfl: 1   vitamin E 180 MG (400 UNITS) capsule, Take 400 Units by mouth daily., Disp: , Rfl:  Medication Side Effects: none  Family Medical/ Social History: Changes? No  MENTAL HEALTH EXAM:  There were no vitals taken for this visit.There is no height or weight on file to calculate BMI.  General Appearance: Casual  Eye Contact:  Good  Speech:  Clear and Coherent  Volume:  Normal  Mood:  NA  Affect:  Appropriate  Thought Process:  Coherent  Orientation:  Full (Time, Place, and Person)  Thought Content: Logical   Suicidal Thoughts:  No  Homicidal Thoughts:  No  Memory:  WNL  Judgement:  Good  Insight:  Good  Psychomotor Activity:  Normal  Concentration:  Concentration: Good  Recall:  Good  Fund of Knowledge: Good  Language: Good  Assets:  Desire for Improvement  ADL's:  Intact  Cognition: WNL  Prognosis:  Good    DIAGNOSES:    ICD-10-CM   1. Major depressive disorder, recurrent episode, moderate (HCC)  F33.1 sertraline (ZOLOFT) 100 MG tablet    2. Generalized anxiety disorder  F41.1 sertraline (ZOLOFT) 100 MG tablet    busPIRone (BUSPAR) 15 MG tablet      Receiving Psychotherapy: No    RECOMMENDATIONS:   No changes this visit Continue  Zoloft 100 mg daily Continue Buspar 15 mg twice daily Will  report side affects or worsening symptoms promptly Will follow up in 6 months to reassess Greater than 50% of 30 min face to face time with patient was spent on counseling and coordination of care.  We discussed her current stability  with  anxiety and depression. Patient agreed to DC zoloft if rash develops. She will continue psychotherapy. Emergency contact and after hours information provided. Patient verbally contracted for safety. Refills escribed to patient pharmacy.              Joan Flores, NP

## 2023-07-28 ENCOUNTER — Encounter: Payer: Self-pay | Admitting: Professional

## 2023-07-28 ENCOUNTER — Ambulatory Visit: Payer: Commercial Managed Care - HMO | Admitting: Professional

## 2023-07-28 DIAGNOSIS — F331 Major depressive disorder, recurrent, moderate: Secondary | ICD-10-CM | POA: Diagnosis not present

## 2023-07-28 DIAGNOSIS — F411 Generalized anxiety disorder: Secondary | ICD-10-CM | POA: Diagnosis not present
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
## 2023-08-14 ENCOUNTER — Other Ambulatory Visit: Payer: Self-pay | Admitting: Behavioral Health

## 2023-08-14 DIAGNOSIS — F331 Major depressive disorder, recurrent, moderate: Secondary | ICD-10-CM

## 2023-08-14 DIAGNOSIS — F411 Generalized anxiety disorder: Secondary | ICD-10-CM

## 2023-08-18 ENCOUNTER — Ambulatory Visit (INDEPENDENT_AMBULATORY_CARE_PROVIDER_SITE_OTHER): Admitting: Professional

## 2023-08-18 ENCOUNTER — Encounter: Payer: Self-pay | Admitting: Professional

## 2023-08-18 DIAGNOSIS — F331 Major depressive disorder, recurrent, moderate: Secondary | ICD-10-CM | POA: Diagnosis not present

## 2023-08-18 DIAGNOSIS — F411 Generalized anxiety disorder: Secondary | ICD-10-CM
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
## 2023-08-25 ENCOUNTER — Encounter: Payer: Self-pay | Admitting: Professional

## 2023-08-25 ENCOUNTER — Ambulatory Visit (INDEPENDENT_AMBULATORY_CARE_PROVIDER_SITE_OTHER): Admitting: Professional

## 2023-08-25 DIAGNOSIS — F411 Generalized anxiety disorder: Secondary | ICD-10-CM | POA: Diagnosis not present

## 2023-08-25 DIAGNOSIS — F331 Major depressive disorder, recurrent, moderate: Secondary | ICD-10-CM | POA: Diagnosis not present
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
## 2023-09-08 ENCOUNTER — Encounter: Payer: Self-pay | Admitting: Professional

## 2023-09-08 ENCOUNTER — Ambulatory Visit (INDEPENDENT_AMBULATORY_CARE_PROVIDER_SITE_OTHER): Admitting: Professional

## 2023-09-08 DIAGNOSIS — F331 Major depressive disorder, recurrent, moderate: Secondary | ICD-10-CM | POA: Diagnosis not present

## 2023-09-08 DIAGNOSIS — F411 Generalized anxiety disorder: Secondary | ICD-10-CM
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
## 2023-09-15 ENCOUNTER — Encounter: Payer: Self-pay | Admitting: Professional

## 2023-09-15 ENCOUNTER — Ambulatory Visit (INDEPENDENT_AMBULATORY_CARE_PROVIDER_SITE_OTHER): Admitting: Professional

## 2023-09-15 DIAGNOSIS — F331 Major depressive disorder, recurrent, moderate: Secondary | ICD-10-CM | POA: Diagnosis not present

## 2023-09-15 DIAGNOSIS — F411 Generalized anxiety disorder: Secondary | ICD-10-CM | POA: Diagnosis not present
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
## 2023-09-22 ENCOUNTER — Ambulatory Visit (INDEPENDENT_AMBULATORY_CARE_PROVIDER_SITE_OTHER): Admitting: Professional

## 2023-09-22 ENCOUNTER — Encounter: Payer: Self-pay | Admitting: Professional

## 2023-09-22 DIAGNOSIS — F331 Major depressive disorder, recurrent, moderate: Secondary | ICD-10-CM | POA: Diagnosis not present

## 2023-09-22 DIAGNOSIS — F411 Generalized anxiety disorder: Secondary | ICD-10-CM | POA: Diagnosis not present
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
## 2023-09-29 ENCOUNTER — Encounter: Payer: Self-pay | Admitting: Professional

## 2023-09-29 ENCOUNTER — Ambulatory Visit (INDEPENDENT_AMBULATORY_CARE_PROVIDER_SITE_OTHER): Admitting: Professional

## 2023-09-29 DIAGNOSIS — F331 Major depressive disorder, recurrent, moderate: Secondary | ICD-10-CM

## 2023-09-29 DIAGNOSIS — F411 Generalized anxiety disorder: Secondary | ICD-10-CM
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
## 2023-10-06 ENCOUNTER — Ambulatory Visit (INDEPENDENT_AMBULATORY_CARE_PROVIDER_SITE_OTHER): Admitting: Professional

## 2023-10-06 ENCOUNTER — Encounter: Payer: Self-pay | Admitting: Professional

## 2023-10-06 DIAGNOSIS — F411 Generalized anxiety disorder: Secondary | ICD-10-CM

## 2023-10-06 DIAGNOSIS — F331 Major depressive disorder, recurrent, moderate: Secondary | ICD-10-CM
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
## 2023-10-13 ENCOUNTER — Encounter: Payer: Self-pay | Admitting: Professional

## 2023-10-13 ENCOUNTER — Ambulatory Visit: Admitting: Professional

## 2023-10-13 ENCOUNTER — Encounter: Payer: Self-pay | Admitting: Family Medicine

## 2023-10-13 DIAGNOSIS — G2581 Restless legs syndrome: Secondary | ICD-10-CM

## 2023-10-13 DIAGNOSIS — F331 Major depressive disorder, recurrent, moderate: Secondary | ICD-10-CM

## 2023-10-13 DIAGNOSIS — F411 Generalized anxiety disorder: Secondary | ICD-10-CM | POA: Diagnosis not present

## 2023-10-13 MED ORDER — GABAPENTIN 100 MG PO TABS
200.0000 mg | ORAL_TABLET | Freq: Every day | ORAL | 0 refills | Status: DC
Start: 2023-10-13 — End: 2024-01-12

## 2023-10-13 NOTE — Progress Notes (Signed)
 [name]  Patient ID: Elizabeth [name], MRN: [id number],    Date: 10/13/2023  Time Spent: 51 minutes [id number]  Treatment Type: Individual Therapy  Risk Assessment: Danger to Self:  No Self-injurious Behavior: No Danger to Others: No  Subjective: This session was held via video teletherapy The patient consented to video teletherapy and was located at her home during this session. She is aware it is the responsibility of the patient to secure confidentiality on her end of the session. The provider was in a private home office for the duration of this session.   The patient arrived on time for her Caregility session.  Issues addressed: 1-mother a-learned pt was right related to mother's Lasix prescription b-mother was lamenting the Mother's Day gift received last year more from Elizabeth [name] -pt confronted her mother on needing to tel her brother Elizabeth [name] what she wanted c-has never had a positive example of a mother 2-EMDR a- "I'm invisible" -positive cognition pt compares in with "I can find ways to thrive and get my needs met regardless 3-relationships with siblings -pt and two brothers (older Elizabeth [name] and younger Elizabeth [name]) moved away upon HS graduation and never returned -younger brother and sister stayed home and have never left the area -pt went 700 miles away from childhood home and was not able to return home -her two brothers Elizabeth [name] and Elizabeth [name]) went in the service and both retired -her brother was in [place] and [place] flying [employer] gun ship -his unit was in helicopter crash with Elizabeth [name] but he was not on the mission -he had trained with some people on the mission -relationship changed when Elizabeth [name] left home to Elizabeth [name] and he moved a lot -older brother did not see combat -his biggest trauma was being sent to [place] for a year when his wife was pregnant -Elizabeth [name] was not able to come home to see his son until he was four months old -he  got out and went to med school and went back into the service after completion -he worked at the [employer] at the end of his career -when he died she went to [employer] and he was very well likes -sister Elizabeth [name] has always been all about her -Elizabeth [name] is closer to her husband's two SIL's -she thinks it will be 2/2 Elizabeth [name] and pt politically similar) and her sister and she are both bible-believing Christians  2025 Treatment Plan Problems: Social Anxiety, Unipolar Depression Symptoms: History of chronic or recurrent depression for which the client has taken antidepressant medication, been hospitalized, had outpatient treatment, or had a course of electroconvulsive therapy. Poor concentration and indecisiveness. Overall pattern of social anxiety, shyness, or timidity that presents itself in most social situations. Debilitating performance anxiety and/or avoidance of required social performance demands. Avoidance of situations that require a degree of interpersonal contact. Goals: Reach a personal balance between solitary time and interpersonal interaction with others. Interact socially without undue fear or anxiety. Recognize, accept, and cope with feelings of depression. Develop healthy thinking patterns and beliefs about self, others, and the world that lead to the alleviation and help prevent the relapse of depression. Objectives target date for all objectives is 10/05/2024: Explore past experiences that may be the source of low self-esteem and social anxiety currently. Work through developmental conflicts that may be influencing current struggles with fear and avoidance and take appropriate actions. Return for a follow-up session to track progress, reinforce gains, and problem-solve barriers. Learn and implement conflict resolution skills to  resolve interpersonal problems. Increasingly verbalize hopeful and positive statements regarding self, others, and the future. Interventions: Assign the  client to read the books Healing the Shame That Binds You by Vicie Grain and Facing Shame by Derrick Fling and Elwin Hammond, and process key ideas. Probe childhood experiences of criticism, abandonment, or abuse that would foster low self-esteem and shame; process these. Use an insight-oriented approach to explore how psychodynamic conflicts (e.g., separation/autonomy; anger recognition, management, and coping) may be manifesting as social fear and avoidance; address transference; work through separation and anger themes during therapy and upon termination toward developing a new ability to manage separations and autonomy. Schedule a follow-up or "booster session" for the client for 1 to 3 months after therapy ends to track progress. Teach conflict resolution skills (e.g., empathy, active listening, "I messages," respectful communication, assertiveness without aggression, compromise); use psychoeducation, modeling, role-playing, and rehearsal to work through several current conflicts; assign homework exercises; review and repeat so as to integrate their use into the client's life. Assign the client to write at least one positive affirmation statement daily regarding himself/herself and the future (or assign "Positive Self-Talk" in the Adult Psychotherapy Homework Planner by Beacher Bottoms).  Diagnosis:Generalized anxiety disorder  Major depressive disorder, recurrent episode, moderate (HCC)  Plan:  -next session will be Monday, October 27, 2023 at 4pm.

## 2023-10-27 ENCOUNTER — Encounter: Payer: Self-pay | Admitting: Professional

## 2023-10-27 ENCOUNTER — Ambulatory Visit (INDEPENDENT_AMBULATORY_CARE_PROVIDER_SITE_OTHER): Admitting: Professional

## 2023-10-27 DIAGNOSIS — F411 Generalized anxiety disorder: Secondary | ICD-10-CM | POA: Diagnosis not present

## 2023-10-27 DIAGNOSIS — F331 Major depressive disorder, recurrent, moderate: Secondary | ICD-10-CM

## 2023-10-27 NOTE — Progress Notes (Signed)
  Behavioral Health Counselor/Therapist Progress Note  Patient ID: Elizabeth [name], MRN: [id number],    Date: 10/27/2023  Time Spent: 43 minutes [id number]  Treatment Type: Individual Therapy  Risk Assessment: Danger to Self:  No Self-injurious Behavior: No Danger to Others: No  Subjective: This session was held via video teletherapy The patient consented to video teletherapy and was located at her home during this session. She is aware it is the responsibility of the patient to secure confidentiality on her end of the session. The provider was in a private home office for the duration of this session.   The patient arrived on time for her Caregility session.  Issues addressed: 1-mother a-planning a trip to [place] -SIL let her know the family was traveling up in August and she will visit at the same time 2-mood -"I'm doing pretty good" -pt references back to [phone] when she was first married and doesn't think she has ever been that happy again "but all that wasn't real anyways" -pt sees herself as in the best place she has been greater than 5 years -she doesn't recall many times she was healthy in childhood or college -she enjoyed early adulthood but was "involved in a not so perfect relationship again" -pt reports she is probably in the best mental health she has never been in -she admits noticing her self-talk will be key to avoid relapse -not being able to reframe things or feel paralyzed by the depression -pt admits that the trend toward depression will be a problem -not getting outside and enjoying nature -pt admits that there is danger in the winter months -get outside during winter months when there are sunshiny days -get a light box  2025 Treatment Plan Problems: Social Anxiety, Unipolar Depression Symptoms: History of chronic or recurrent depression for which the client has taken antidepressant medication, been hospitalized, had outpatient treatment, or  had a course of electroconvulsive therapy. Poor concentration and indecisiveness. Overall pattern of social anxiety, shyness, or timidity that presents itself in most social situations. Debilitating performance anxiety and/or avoidance of required social performance demands. Avoidance of situations that require a degree of interpersonal contact. Goals: Reach a personal balance between solitary time and interpersonal interaction with others. Interact socially without undue fear or anxiety. Recognize, accept, and cope with feelings of depression. Develop healthy thinking patterns and beliefs about self, others, and the world that lead to the alleviation and help prevent the relapse of depression. Objectives target date for all objectives is 10/05/2024: Explore past experiences that may be the source of low self-esteem and social anxiety currently. Work through developmental conflicts that may be influencing current struggles with fear and avoidance and take appropriate actions. Return for a follow-up session to track progress, reinforce gains, and problem-solve barriers. Learn and implement conflict resolution skills to resolve interpersonal problems. Increasingly verbalize hopeful and positive statements regarding self, others, and the future. Interventions: Assign the client to read the books Healing the Shame That Binds You by Vicie Grain and Facing Shame by Derrick Fling and Elwin Hammond, and process key ideas. Probe childhood experiences of criticism, abandonment, or abuse that would foster low self-esteem and shame; process these. Use an insight-oriented approach to explore how psychodynamic conflicts (e.g., separation/autonomy; anger recognition, management, and coping) may be manifesting as social fear and avoidance; address transference; work through separation and anger themes during therapy and upon termination toward developing a new ability to manage separations and autonomy. Schedule a follow-up or  "booster session" for the client for 1 to  3 months after therapy ends to track progress. Teach conflict resolution skills (e.g., empathy, active listening, "I messages," respectful communication, assertiveness without aggression, compromise); use psychoeducation, modeling, role-playing, and rehearsal to work through several current conflicts; assign homework exercises; review and repeat so as to integrate their use into the client's life. Assign the client to write at least one positive affirmation statement daily regarding himself/herself and the future (or assign "Positive Self-Talk" in the Adult Psychotherapy Homework Planner by Beacher Bottoms).  Diagnosis:Generalized anxiety disorder  Major depressive disorder, recurrent episode, moderate (HCC)  Plan:  -next session will be Monday, December 08, 2023 at 12pm.

## 2023-12-08 ENCOUNTER — Encounter: Payer: Self-pay | Admitting: Professional

## 2023-12-08 ENCOUNTER — Ambulatory Visit (INDEPENDENT_AMBULATORY_CARE_PROVIDER_SITE_OTHER): Admitting: Professional

## 2023-12-08 DIAGNOSIS — F331 Major depressive disorder, recurrent, moderate: Secondary | ICD-10-CM | POA: Diagnosis not present

## 2023-12-08 DIAGNOSIS — F411 Generalized anxiety disorder: Secondary | ICD-10-CM

## 2023-12-08 NOTE — Progress Notes (Signed)
 [facility] Counselor/Therapist Progress Note  Patient ID: Elizabeth [name], MRN: [id number],    Date: 12/08/2023  Time Spent: 46 minutes 1206-1252pm  Treatment Type: Individual Therapy  Risk Assessment: Danger to Self:  No Self-injurious Behavior: No Danger to Others: No  Subjective: This session was held via video teletherapy The patient consented to video teletherapy and was located at her home during this session. She is aware it is the responsibility of the patient to secure confidentiality on her end of the session. The provider was in a private home office for the duration of this session.   The patient arrived on time for her Caregility session.  Issues addressed: 1-mood -pleasant and cooperative 2-vacation a-visit to [facility] -Aug 10-13th -texted mom who did not respond and said she thought she had responded -her [place] SIL (widow of brother) might be there with only reluctance being politics b-[place] the [place]  -she is not drama-free -she has a brain tumor and people have backed off -they removed a third of her brain on the left side and that is why the personality changes -she has other health issues and that could get her before cancer does -barriers: may not be as relaxing, conversations may not be as free-flowing, sometimes I feels like her therapist, it depends on where she is at -pt to consider prepping Joen with her expectations -Joen does have issues with processing and word finding 3-mom -home by herself this week -brother and sister are away camping together 4-relationship a-friend Donnette left husband and now with female significant other Manuelita -guesses she has been dissed -pt uncertain how to respond since Donnette is the person that had initially reached out -pt admits that if it were a female she was involved she might be willing to pursue -pt has heard that Donnette's partner has  a lot of drama b-Donnette characteristics -pros: is friendly, intelligent, drama-free,j emotionally available -not stressful interacting with just her but she has changes though Donnette would not agree -pt does not feel as comfortable since she has been dating Manuelita  2025 Treatment Plan Problems: Social Anxiety, Unipolar Depression Symptoms: History of chronic or recurrent depression for which the client has taken antidepressant medication, been hospitalized, had outpatient treatment, or had a course of electroconvulsive therapy. Poor concentration and indecisiveness. Overall pattern of social anxiety, shyness, or timidity that presents itself in most social situations. Debilitating performance anxiety and/or avoidance of required social performance demands. Avoidance of situations that require a degree of interpersonal contact. Goals: Reach a personal balance between solitary time and interpersonal interaction with others. Interact socially without undue fear or anxiety. Recognize, accept, and cope with feelings of depression. Develop healthy thinking patterns and beliefs about self, others, and the world that lead to the alleviation and help prevent the relapse of depression. Objectives target date for all objectives is 10/05/2024: Explore past experiences that may be the source of low self-esteem and social anxiety currently. Work through developmental conflicts that may be influencing current struggles with fear and avoidance and take appropriate actions. Return for a follow-up session to track progress, reinforce gains, and problem-solve barriers. Learn and implement conflict resolution skills to resolve interpersonal problems. Increasingly verbalize hopeful and positive statements regarding self, others, and the future. Interventions: Assign the client to read the books Healing the Shame That Binds You by Harl and Facing Shame by Ren and Jonette, and process key ideas. Probe  childhood experiences of criticism, abandonment, or  abuse that would foster low self-esteem and shame; process these. Use an insight-oriented approach to explore how psychodynamic conflicts (e.g., separation/autonomy; anger recognition, management, and coping) may be manifesting as social fear and avoidance; address transference; work through separation and anger themes during therapy and upon termination toward developing a new ability to manage separations and autonomy. Schedule a follow-up or booster session for the client for 1 to 3 months after therapy ends to track progress. Teach conflict resolution skills (e.g., empathy, active listening, I messages, respectful communication, assertiveness without aggression, compromise); use psychoeducation, modeling, role-playing, and rehearsal to work through several current conflicts; assign homework exercises; review and repeat so as to integrate their use into the client's life. Assign the client to write at least one positive affirmation statement daily regarding himself/herself and the future (or assign Positive Self-Talk in the Adult Psychotherapy Homework Planner by Jenniffer).  Diagnosis:Generalized anxiety disorder  Major depressive disorder, recurrent episode, moderate (HCC)  Plan:  -next session will be Monday, January 19, 2024 at 12pm.

## 2024-01-11 ENCOUNTER — Other Ambulatory Visit: Payer: Self-pay | Admitting: Family Medicine

## 2024-01-11 DIAGNOSIS — G2581 Restless legs syndrome: Secondary | ICD-10-CM

## 2024-01-12 ENCOUNTER — Ambulatory Visit (INDEPENDENT_AMBULATORY_CARE_PROVIDER_SITE_OTHER): Payer: Self-pay | Admitting: Behavioral Health

## 2024-01-12 ENCOUNTER — Encounter: Payer: Self-pay | Admitting: Behavioral Health

## 2024-01-12 DIAGNOSIS — G2581 Restless legs syndrome: Secondary | ICD-10-CM | POA: Diagnosis not present

## 2024-01-12 DIAGNOSIS — F411 Generalized anxiety disorder: Secondary | ICD-10-CM | POA: Diagnosis not present

## 2024-01-12 DIAGNOSIS — F331 Major depressive disorder, recurrent, moderate: Secondary | ICD-10-CM

## 2024-01-12 MED ORDER — BUSPIRONE HCL 15 MG PO TABS: 15.0000 mg | ORAL_TABLET | Freq: Two times a day (BID) | ORAL | 1 refills | Status: AC

## 2024-01-12 MED ORDER — SERTRALINE HCL 100 MG PO TABS: 100.0000 mg | ORAL_TABLET | Freq: Every day | ORAL | 1 refills | Status: AC

## 2024-01-12 NOTE — Progress Notes (Signed)
 [facility]  Patient ID: Elizabeth [name],  MRN: [id number]  PCP: Alvan Dorothyann BIRCH, MD  Date of Evaluation: 01/12/2024 Time spent:30 minutes  Chief Complaint:   HISTORY/CURRENT STATUS: HPI 63 year old female presents to this office for follow up and medication management.  No changes this visit. Continue with great overall stability.  Says she is in a good place right now. Some continued family stressors. Anxiety and depression levels are manageable. She reports that she continues with psychotherapy.   No medication changes are indicated at this time.  She reports decreased anxiety at 2/10 and depression at 1/10. She is sleeping 6- 7  hours per night. . No mania, no psychosis, no SI/HI.   Past Psychiatric medication failures: Prozac Paxil Cymbalta Effexor  Wellbutrin     Individual Medical History/ Review of Systems: Changes? :No   Allergies: Dextromethorphan-guaifenesin , Fluoxetine hcl, and Wellbutrin  [bupropion ]  Current Medications:  Current Outpatient Medications:    Ascorbic Acid  (VITAMIN C) 1000 MG tablet, Take 1 tablet by mouth daily., Disp: , Rfl:    busPIRone  (BUSPAR ) 15 MG tablet, Take 1 tablet (15 mg total) by mouth 2 (two) times daily., Disp: 180 tablet, Rfl: 1   CAFFEINE PO, Take 100 mg by mouth daily., Disp: , Rfl:    Cholecalciferol (VITAMIN D-3 PO), Take 2,000 Units by mouth daily., Disp: , Rfl:    Coenzyme Q10 (CO Q 10) 100 MG CAPS, Take 100 mg by mouth daily., Disp: , Rfl:    ELDERBERRY PO, Take 150 mg by mouth daily., Disp: , Rfl:    EPINEPHrine  (EPIPEN  2-PAK) 0.3 mg/0.3 mL IJ SOAJ injection, Inject 0.3 mg into the muscle as needed for anaphylaxis., Disp: 1 each, Rfl: 1   famotidine (PEPCID) 10 MG tablet, Take 10 mg by mouth 2 (two) times daily., Disp: , Rfl:    ferrous sulfate  325 (65 FE) MG tablet, Take 325 mg by mouth See admin instructions. Tuesday, Thursday, Saturday and sunday, Disp: , Rfl:    fexofenadine (ALLEGRA) 180 MG tablet, Take  180 mg by mouth every morning., Disp: , Rfl:    gabapentin  (NEURONTIN ) 100 MG capsule, TAKE 2 CAPSULE BY MOUTH AT BEDTIME, Disp: 180 capsule, Rfl: 0   Glucosamine 750 MG TABS, Take 1 tablet by mouth 2 (two) times daily., Disp: , Rfl:    loratadine  (CLARITIN ) 10 MG tablet, Take 10 mg by mouth daily., Disp: , Rfl:    Multiple Vitamin (MULTIVITAMIN) tablet, Take 1 tablet by mouth daily., Disp: , Rfl:    norethindrone -ethinyl estradiol  (FEMHRT LOW DOSE) 0.5-2.5 MG-MCG tablet, Take 1 tablet by mouth daily., Disp: , Rfl:    sertraline  (ZOLOFT ) 100 MG tablet, Take 1 tablet (100 mg total) by mouth daily., Disp: 90 tablet, Rfl: 1   vitamin E 180 MG (400 UNITS) capsule, Take 400 Units by mouth daily., Disp: , Rfl:  Medication Side Effects: none  Family Medical/ Social History: Changes? No  MENTAL HEALTH EXAM:  There were no vitals taken for this visit.There is no height or weight on file to calculate BMI.  General Appearance: Casual, Neat, and Well Groomed  Eye Contact:  Good  Speech:  Clear and Coherent  Volume:  Normal  Mood:  NA  Affect:  Appropriate  Thought Process:  Coherent  Orientation:  Full (Time, Place, and Person)  Thought Content: Logical   Suicidal Thoughts:  No  Homicidal Thoughts:  No  Memory:  WNL  Judgement:  Good  Insight:  Good  Psychomotor Activity:  Normal  Concentration:  Concentration: Good  Recall:  Good  Fund of Knowledge: Good  Language: Good  Assets:  Desire for Improvement  ADL's:  Intact  Cognition: WNL  Prognosis:  Good    DIAGNOSES:    ICD-10-CM   1. RLS (restless legs syndrome)  G25.81     2. Generalized anxiety disorder  F41.1 busPIRone  (BUSPAR ) 15 MG tablet    sertraline  (ZOLOFT ) 100 MG tablet    3. Major depressive disorder, recurrent episode, moderate (HCC)  F33.1 sertraline  (ZOLOFT ) 100 MG tablet      Receiving Psychotherapy: No    RECOMMENDATIONS:  Greater than 50% of 20  min face to face time with patient was spent on counseling and  coordination of care.  We discussed her current stability  with  anxiety and depression.  Reported some stressors due to family dynamics but effective in setting boundaries. She will continue psychotherapy. Emergency contact and after hours information provided. Patient verbally contracted for safety. Refills escribed to patient pharmacy.  No significant psychosocial changes this visit Continue  Zoloft  100 mg daily Continue Buspar  15 mg twice daily Will report side affects or worsening symptoms promptly Will follow up in 6 months to reassess   Redell DELENA Pizza, NP

## 2024-01-19 ENCOUNTER — Ambulatory Visit (INDEPENDENT_AMBULATORY_CARE_PROVIDER_SITE_OTHER): Admitting: Professional

## 2024-01-19 ENCOUNTER — Encounter: Payer: Self-pay | Admitting: Professional

## 2024-01-19 DIAGNOSIS — F331 Major depressive disorder, recurrent, moderate: Secondary | ICD-10-CM | POA: Diagnosis not present

## 2024-01-19 DIAGNOSIS — G2581 Restless legs syndrome: Secondary | ICD-10-CM | POA: Diagnosis not present

## 2024-01-19 DIAGNOSIS — F411 Generalized anxiety disorder: Secondary | ICD-10-CM | POA: Diagnosis not present

## 2024-01-19 NOTE — Progress Notes (Signed)
 [facility] Counselor/Therapist Progress Note  Patient ID: Elizabeth [name], MRN: [id number],    Date: 01/19/2024  Time Spent: 52 minutes 1206-1258pm  Treatment Type: Individual Therapy  Risk Assessment: Danger to Self:  No Self-injurious Behavior: No Danger to Others: No  Subjective: This session was held via video teletherapy The patient consented to video teletherapy and was located at her home during this session. She is aware it is the responsibility of the patient to secure confidentiality on her end of the session. The provider was in a private home office for the duration of this session.   The patient arrived on time for her Caregility session.  Issues addressed: 1-self-care a-recovering from vacation -she is lighthearted but overwhelmed with everything she had to do to prepare to leave and to completed when she gets back -it's taken her a full week to recover -pt did have GI issues for about a week upon her return b-planning a week off for October -she is going for a one day training in [place] -has time to plan for the vacation and not feel as overwhelmed -she is no longer living day by day and getting to the point where she can plan 2-vacation a-visit to [facility] was okay -she recognizes their brokenness -it was good to see her SIL from [place] -SIL only talked about the problems with pt's brother and nephew b-her sister didn't disappoint and behaved as usual; she is at least consistent c-her brother from [place] picked her up at the airport and they went to get BBQ -he filled her in on sister feeling disappointed that she was not getting to go o her camping trip though plans for their visit was scheduled in May d-celebrated brother's birthdays though [place] brother turned 50 and did not want to -pt suggested that [name], younger one, born the next week, and Jim's -sister wanted to wait until the following week when the whole family was not  together -pt has decided that though she is 63 years old she is emotionally immature e-her mother did a zinger -shortly after she got there she said will I get in trouble if I tell you you look good -pt remarked that since she has always told her how fat she was so she could tell her she looked good -she noticed her mother struggled to get her meds organized -pt attempted to show her how to complete her medications -I think I'm glad you're not around all the time f-pt responded that she is not best version of herself when she is with her family -17 year old paternal aunt was there too and responded that you could come and take care of me any day g-she did not sleep well, did not enjoy herself, at times with 14 people there and pt could not deal with a lot of the chatter -a lot was negative and draining -brother in [place] not as negative though he is dealing with his own health issues -pt and her brother are different g-nobody really asked me how I was, they only asked me how the business was doing -she notices she goes back into role of little sister though she has accomplished far more than any of her family h-Jim, SIL and aunt are a little more intelligent and above the norm -they were more able to deal with what wife throws at them and her sister has never continued her desire to learn and the other brother is the alcoholic -the visit reinforced that she doesn't  have any desire to return home and for her desire for distance  2025 Treatment Plan Problems: Social Anxiety, Unipolar Depression Symptoms: History of chronic or recurrent depression for which the client has taken antidepressant medication, been hospitalized, had outpatient treatment, or had a course of electroconvulsive therapy. Poor concentration and indecisiveness. Overall pattern of social anxiety, shyness, or timidity that presents itself in most social situations. Debilitating performance anxiety and/or avoidance of  required social performance demands. Avoidance of situations that require a degree of interpersonal contact. Goals: Reach a personal balance between solitary time and interpersonal interaction with others. Interact socially without undue fear or anxiety. Recognize, accept, and cope with feelings of depression. Develop healthy thinking patterns and beliefs about self, others, and the world that lead to the alleviation and help prevent the relapse of depression. Objectives target date for all objectives is 10/05/2024: Explore past experiences that may be the source of low self-esteem and social anxiety currently. Work through developmental conflicts that may be influencing current struggles with fear and avoidance and take appropriate actions. Return for a follow-up session to track progress, reinforce gains, and problem-solve barriers. Learn and implement conflict resolution skills to resolve interpersonal problems. Increasingly verbalize hopeful and positive statements regarding self, others, and the future. Interventions: Assign the client to read the books Healing the Shame That Binds You by Harl and Facing Shame by Ren and Jonette, and process key ideas. Probe childhood experiences of criticism, abandonment, or abuse that would foster low self-esteem and shame; process these. Use an insight-oriented approach to explore how psychodynamic conflicts (e.g., separation/autonomy; anger recognition, management, and coping) may be manifesting as social fear and avoidance; address transference; work through separation and anger themes during therapy and upon termination toward developing a new ability to manage separations and autonomy. Schedule a follow-up or booster session for the client for 1 to 3 months after therapy ends to track progress. Teach conflict resolution skills (e.g., empathy, active listening, I messages, respectful communication, assertiveness without aggression, compromise);  use psychoeducation, modeling, role-playing, and rehearsal to work through several current conflicts; assign homework exercises; review and repeat so as to integrate their use into the client's life. Assign the client to write at least one positive affirmation statement daily regarding himself/herself and the future (or assign Positive Self-Talk in the Adult Psychotherapy Homework Planner by Jenniffer).  Diagnosis:Generalized anxiety disorder  Major depressive disorder, recurrent episode, moderate (HCC)  RLS (restless legs syndrome)  Plan:  -next session will be Monday, January 19, 2024 at 12pm.

## 2024-02-02 ENCOUNTER — Encounter: Payer: Self-pay | Admitting: Family Medicine

## 2024-02-02 ENCOUNTER — Ambulatory Visit: Payer: Self-pay | Admitting: Family Medicine

## 2024-02-02 VITALS — BP 136/82 | HR 61 | Ht 64.0 in | Wt 157.7 lb

## 2024-02-02 DIAGNOSIS — R519 Headache, unspecified: Secondary | ICD-10-CM | POA: Diagnosis not present

## 2024-02-02 DIAGNOSIS — M2550 Pain in unspecified joint: Secondary | ICD-10-CM | POA: Diagnosis not present

## 2024-02-02 DIAGNOSIS — R5383 Other fatigue: Secondary | ICD-10-CM | POA: Diagnosis not present

## 2024-02-02 DIAGNOSIS — M654 Radial styloid tenosynovitis [de Quervain]: Secondary | ICD-10-CM | POA: Diagnosis not present

## 2024-02-02 NOTE — Progress Notes (Signed)
 Pt reports that she has been experiencing fatigue, bilateral knee,shoulder pain, Headaches that have not been like her usual tension headaches, neck pain, bilateral wrist and thumb pain. She stated that her R thumb is worse than the L and that she has been using her wrist splints to help.  Pt has been using heat,ice,ben gay and laser for her pain.

## 2024-02-02 NOTE — Patient Instructions (Signed)
 Recommend a thumb spica splint for Elizabeth [name]

## 2024-02-02 NOTE — Progress Notes (Signed)
 Established Patient Office Visit  Subjective  Patient ID: Elizabeth [name], female    DOB: [birth year]  Age: 64 y.o. MRN: [id number]  No chief complaint on file.   HPI  Pt reports that she has been experiencing fatigue, bilateral knee,shoulder pain.  No known trauma.  Headaches that have not been like her usual tension headaches, neck pain, bilateral wrist and thumb pain.   She stated that her R thumb is worse than the L and that she has been using her wrist splints to help.   Pt has been using heat,ice,bengay and laser for her pain.    Discussed the use of AI scribe software for clinical note transcription with the patient, who gave verbal consent to proceed.  History of Present Illness Elizabeth [name] is a 63 year old female who presents with multiple joint pain, fatigue, and headaches.  Polyarticular arthralgia and myalgia - Multiple joint pain over the past few weeks, described as shifting achiness affecting various joints including thumbs, wrists, and knees - Pain sometimes accompanied by a 'crunchy' sensation in the neck - Neck tenderness, particularly on the upper right side - Lack of strength in the right wrist , especially when lifting a 25-pound dog - Pain with activities involving scissors or similar motions - Wears a cockup splint at night on right, which provides relief  - No increase in night sweats or fever.  No unusual rashes.  Headache - Headaches typically focused on the upper right side of the neck, associated with neck tenderness. Now  having HA more in her temples   Fatigue and cognitive impairment - Significant fatigue, especially in the evenings - Episodes of extreme tiredness requiring early bedtime - Associated 'brain fog' during periods of fatigue  Cutaneous findings - History of tick bite in June behind the lef tknee , with the spot still visible - Little pink dots on the legs, not itchy - Episode of intense itching about ten days ago on  palms, associated with urticaria. Hx of recurreent utricaria   Restless leg syndrome and neuropathic symptoms - History of restless leg syndrome with atypical symptoms including paresthesias and limb heaviness  Medication changes - Gradual reduction of gabapentin  dosage from 800 mg to 100 mg - No recent changes in medications aside from gabapentin  reduction - Suspects that reduction in gabapentin  may be uncovering underlying symptoms previously controlled by the medication  Constitutional symptoms - No unusual night sweats - No low-grade fever - No significant weight loss, with current weight loss being intentional due to dietary changes      ROS    Objective:     BP 136/82   Pulse 61   Ht 5' 4 (1.626 m)   Wt 157 lb 11.2 oz (71.5 kg)   SpO2 100%   BMI 27.07 kg/m    Physical Exam Vitals and nursing note reviewed.  Constitutional:      Appearance: Normal appearance.  HENT:     Head: Normocephalic and atraumatic.  Eyes:     Conjunctiva/sclera: Conjunctivae normal.  Pulmonary:     Effort: Pulmonary effort is normal.  Musculoskeletal:     Comments: + finkelstein's on right wrist.    Skin:    General: Skin is warm and dry.  Neurological:     Mental Status: She is alert.  Psychiatric:        Mood and Affect: Mood normal.      Results for orders placed or performed in visit on 02/02/24  Results  Console HPV  Result Value Ref Range   CHL HPV Negative       The 10-year ASCVD risk score (Arnett DK, et al., 2019) is: 3.7%    Assessment & Plan:   Problem List Items Addressed This Visit   None Visit Diagnoses       Polyarthralgia    -  Primary   Relevant Orders   CMP14+EGFR   Lipid Panel With LDL/HDL Ratio   Sed Rate (ESR)   Rheumatoid factor   C-reactive protein   CYCLIC CITRUL PEPTIDE ANTIBODY, IGG/IGA   Antinuclear Antib (ANA)   Uric acid   CBC with Differential/Platelet   Lyme Disease Serology w/Reflex   Urinalysis   Iron Binding Cap  (TIBC)([facility])     Generalized headaches       Relevant Orders   CMP14+EGFR   Lipid Panel With LDL/HDL Ratio   Sed Rate (ESR)   Rheumatoid factor   C-reactive protein   CYCLIC CITRUL PEPTIDE ANTIBODY, IGG/IGA   Antinuclear Antib (ANA)   Uric acid   CBC with Differential/Platelet   Lyme Disease Serology w/Reflex   Urinalysis   Iron Binding Cap (TIBC)([facility])     Other fatigue       Relevant Orders   CMP14+EGFR   Lipid Panel With LDL/HDL Ratio   Sed Rate (ESR)   Rheumatoid factor   C-reactive protein   CYCLIC CITRUL PEPTIDE ANTIBODY, IGG/IGA   Antinuclear Antib (ANA)   Uric acid   CBC with Differential/Platelet   Lyme Disease Serology w/Reflex   Urinalysis   Iron Binding Cap (TIBC)([facility])     De Quervain's tenosynovitis, right          Assessment and Plan Assessment & Plan Polyarticular joint pain and stiffness with fatigue and headache, under evaluation for inflammatory arthropathy or systemic etiology Reports of shifting joint pain, fatigue, and headaches suggest possible inflammatory arthropathy, systemic etiology, or Lyme disease. Tick bite history in June considered. - Order blood tests: sed rate, CRP, anti-CCP, ANA, uric acid level, CK, thyroid  function tests, CBC with differential. - Order Lyme disease antibody titers.  Bilateral thumb pain, likely de Quervain's tenosynovitis Bilateral thumb pain, right more affected, with positive Finkelstein's test  on right suggests de Quervain's tenosynovitis. - Recommend thumb spica splint for support and pain relief.    No follow-ups on file.    Elizabeth Byars, MD

## 2024-02-03 ENCOUNTER — Ambulatory Visit: Payer: Self-pay | Admitting: Family Medicine

## 2024-02-03 LAB — IRON AND TIBC
Iron Saturation: 41 % (ref 15–55)
Iron: 123 ug/dL (ref 27–139)
Total Iron Binding Capacity: 300 ug/dL (ref 250–450)
UIBC: 177 ug/dL (ref 118–369)

## 2024-02-03 NOTE — Progress Notes (Signed)
 HI Jenita,  Total cholesterol and LDL cholesterol are up from last year. Blood count and metabolic panel is normal. Inflammatory markers are normal.  RF is negative. Uric acid looks good.  Other labs still pending.     The 10-year ASCVD risk score (Arnett DK, et al., 2019) is: 3.5%   Values used to calculate the score:     Age: 63 years     Clincally relevant sex: Female     Is Non-Hispanic African American: No     Diabetic: No     Tobacco smoker: No     Systolic Blood Pressure: 136 mmHg     Is BP treated: No     HDL Cholesterol: 73 mg/dL     Total Cholesterol: 207 mg/dL

## 2024-02-04 LAB — CMP14+EGFR
ALT: 18 IU/L (ref 0–32)
AST: 24 IU/L (ref 0–40)
Albumin: 4.8 g/dL (ref 3.9–4.9)
Alkaline Phosphatase: 64 IU/L (ref 44–121)
BUN/Creatinine Ratio: 19 (ref 12–28)
BUN: 18 mg/dL (ref 8–27)
Bilirubin Total: 0.7 mg/dL (ref 0.0–1.2)
CO2: 21 mmol/L (ref 20–29)
Calcium: 9.3 mg/dL (ref 8.7–10.3)
Chloride: 105 mmol/L (ref 96–106)
Creatinine, Ser: 0.96 mg/dL (ref 0.57–1.00)
Globulin, Total: 1.9 g/dL (ref 1.5–4.5)
Glucose: 88 mg/dL (ref 70–99)
Potassium: 4.1 mmol/L (ref 3.5–5.2)
Sodium: 142 mmol/L (ref 134–144)
Total Protein: 6.7 g/dL (ref 6.0–8.5)
eGFR: 67 mL/min/1.73 (ref >59)

## 2024-02-04 LAB — URINALYSIS
Bilirubin, UA: NEGATIVE
Glucose, UA: NEGATIVE
Ketones, UA: NEGATIVE
Leukocytes,UA: NEGATIVE
Nitrite, UA: NEGATIVE
Protein,UA: NEGATIVE
RBC, UA: NEGATIVE
Specific Gravity, UA: 1.009 (ref 1.005–1.030)
Urobilinogen, Ur: 0.2 mg/dL (ref 0.2–1.0)
pH, UA: 6 (ref 5.0–7.5)

## 2024-02-04 LAB — CBC WITH DIFFERENTIAL/PLATELET
Basophils Absolute: 0.1 x10E3/uL (ref 0.0–0.2)
Basos: 1 %
EOS (ABSOLUTE): 0.1 x10E3/uL (ref 0.0–0.4)
Eos: 2 %
Hematocrit: 45.7 % (ref 34.0–46.6)
Hemoglobin: 14.7 g/dL (ref 11.1–15.9)
Immature Grans (Abs): 0 x10E3/uL (ref 0.0–0.1)
Immature Granulocytes: 0 %
Lymphocytes Absolute: 1.9 x10E3/uL (ref 0.7–3.1)
Lymphs: 31 %
MCH: 31.1 pg (ref 26.6–33.0)
MCHC: 32.2 g/dL (ref 31.5–35.7)
MCV: 97 fL (ref 79–97)
Monocytes Absolute: 0.4 x10E3/uL (ref 0.1–0.9)
Monocytes: 7 %
Neutrophils Absolute: 3.7 x10E3/uL (ref 1.4–7.0)
Neutrophils: 59 %
Platelets: 208 x10E3/uL (ref 150–450)
RBC: 4.73 x10E6/uL (ref 3.77–5.28)
RDW: 11.6 % — ABNORMAL LOW (ref 11.7–15.4)
WBC: 6.2 x10E3/uL (ref 3.4–10.8)

## 2024-02-04 LAB — SEDIMENTATION RATE: Sed Rate: 4 mm/h (ref 0–40)

## 2024-02-04 LAB — ANA: Anti Nuclear Antibody (ANA): NEGATIVE

## 2024-02-04 LAB — URIC ACID: Uric Acid: 3.3 mg/dL (ref 3.0–7.2)

## 2024-02-04 LAB — RHEUMATOID FACTOR: Rheumatoid fact SerPl-aCnc: 11.2 [IU]/mL (ref <14.0)

## 2024-02-04 LAB — LIPID PANEL WITH LDL/HDL RATIO
Cholesterol, Total: 207 mg/dL — ABNORMAL HIGH (ref 100–199)
HDL: 73 mg/dL (ref >39)
LDL Chol Calc (NIH): 126 mg/dL — ABNORMAL HIGH (ref 0–99)
LDL/HDL Ratio: 1.7 ratio (ref 0.0–3.2)
Triglycerides: 42 mg/dL (ref 0–149)
VLDL Cholesterol Cal: 8 mg/dL (ref 5–40)

## 2024-02-04 LAB — LYME DISEASE SEROLOGY W/REFLEX: Lyme Total Antibody EIA: NEGATIVE

## 2024-02-04 LAB — C-REACTIVE PROTEIN: CRP: <1 mg/L (ref 0–10)

## 2024-02-04 LAB — CYCLIC CITRUL PEPTIDE ANTIBODY, IGG/IGA: Cyclic Citrullin Peptide Ab: 5 U (ref 0–19)

## 2024-02-04 NOTE — Progress Notes (Signed)
 HI Elizabeth [name],  The anti-CCP was negative.  That test is little bit more specialized for rheumatoid so it is negative.  Lupus test is also normal.  Negative for antibodies to Lyme's disease.  That is very reassuring.  Urine sample also looks okay.  Had also drawn an iron panel just to make sure there was no deficiency for some reason I have not seen that 1 come back through yet but we will keep an eye out.

## 2024-02-16 ENCOUNTER — Encounter: Payer: Self-pay | Admitting: Family Medicine

## 2024-03-08 ENCOUNTER — Encounter: Payer: Self-pay | Admitting: Professional

## 2024-03-08 ENCOUNTER — Ambulatory Visit (INDEPENDENT_AMBULATORY_CARE_PROVIDER_SITE_OTHER): Admitting: Professional

## 2024-03-08 DIAGNOSIS — F331 Major depressive disorder, recurrent, moderate: Secondary | ICD-10-CM

## 2024-03-08 DIAGNOSIS — F411 Generalized anxiety disorder: Secondary | ICD-10-CM | POA: Diagnosis not present

## 2024-03-08 NOTE — Progress Notes (Signed)
  Behavioral Health Counselor/Therapist Progress Note  Patient ID: Elizabeth [name], MRN: [id number],    Date: 03/08/2024  Time Spent:  49 minutes 1103-1152am  Treatment Type: Individual Therapy  Risk Assessment: Danger to Self:  No Self-injurious Behavior: No Danger to Others: No  Subjective: This session was held via video teletherapy The patient consented to video teletherapy and was located at her home during this session. She is aware it is the responsibility of the patient to secure confidentiality on her end of the session. The provider was in a private home office for the duration of this session.   The patient arrived on time for her Caregility session.  Issues addressed: 1-mother a-hung up on her last night b-her other mentioned that she had nothing to do with wedding invitations c-pt asked a question regarding her [employer] -pt had wanted a casual picnic with family/friends and she was whisked away -mother states she said she doesn't remember -her mother said she suspects it was her sister d-talked about brother's drinking and she said wife said he was drinking less -sister had texted her [employer] and pt commented that was not a good time in her life and her mother told her she was super-sensitive e-her mother told her that when things get tough she retreats -she did ask her mom if she ever defended the pt the way she does the other children -pt admits her mom cannot manipulate her the way she does the other 2-sister -she was not invited to nephew's wedding -there were texts back ad forth about accommodations -her sister called her and left a message stating that she make a big mistake -she spoke to her and said no she did not want an invitation 3-Mike -not surprising she married a man that was like her family -when he was living to deceased her perception of him changed Tora, the woman he had an affair with showed up at a party they were  at -she admits that she didn't expect that he would chat because she thought that he loved her the way she loved him -she wishes she had remorse and that she would tell her that -retrospectively, the lady would never look the pt in her eyes   2025 Treatment Plan Problems: Social Anxiety, Unipolar Depression Symptoms: History of chronic or recurrent depression for which the client has taken antidepressant medication, been hospitalized, had outpatient treatment, or had a course of electroconvulsive therapy. Poor concentration and indecisiveness. Overall pattern of social anxiety, shyness, or timidity that presents itself in most social situations. Debilitating performance anxiety and/or avoidance of required social performance demands. Avoidance of situations that require a degree of interpersonal contact. Goals: Reach a personal balance between solitary time and interpersonal interaction with others. Interact socially without undue fear or anxiety. Recognize, accept, and cope with feelings of depression. Develop healthy thinking patterns and beliefs about self, others, and the world that lead to the alleviation and help prevent the relapse of depression. Objectives target date for all objectives is 10/05/2024: Explore past experiences that may be the source of low self-esteem and social anxiety currently. Work through developmental conflicts that may be influencing current struggles with fear and avoidance and take appropriate actions. Return for a follow-up session to track progress, reinforce gains, and problem-solve barriers. Learn and implement conflict resolution skills to resolve interpersonal problems. Increasingly verbalize hopeful and positive statements regarding self, others, and the future. Interventions: Assign the client to read the books Healing the Shame That Binds  You by Harl and Facing Shame by Ren and Jonette, and process key ideas. Probe childhood experiences of  criticism, abandonment, or abuse that would foster low self-esteem and shame; process these. Use an insight-oriented approach to explore how psychodynamic conflicts (e.g., separation/autonomy; anger recognition, management, and coping) may be manifesting as social fear and avoidance; address transference; work through separation and anger themes during therapy and upon termination toward developing a new ability to manage separations and autonomy. Schedule a follow-up or booster session for the client for 1 to 3 months after therapy ends to track progress. Teach conflict resolution skills (e.g., empathy, active listening, I messages, respectful communication, assertiveness without aggression, compromise); use psychoeducation, modeling, role-playing, and rehearsal to work through several current conflicts; assign homework exercises; review and repeat so as to integrate their use into the client's life. Assign the client to write at least one positive affirmation statement daily regarding himself/herself and the future (or assign Positive Self-Talk in the Adult Psychotherapy Homework Planner by Jenniffer).  Diagnosis:Generalized anxiety disorder  Major depressive disorder, recurrent episode, moderate (HCC)  Plan:  -dealing with her family -triggers related to marital -Thanksgiving at [name]'s house -next session will be Monday, April 26, 2024 at 11am.

## 2024-03-22 ENCOUNTER — Ambulatory Visit (AMBULATORY_SURGERY_CENTER)

## 2024-03-22 VITALS — Ht 64.0 in | Wt 155.0 lb

## 2024-03-22 DIAGNOSIS — Z1211 Encounter for screening for malignant neoplasm of colon: Secondary | ICD-10-CM

## 2024-03-22 MED ORDER — NA SULFATE-K SULFATE-MG SULF 17.5-3.13-1.6 GM/177ML PO SOLN: 1.0000 | Freq: Once | ORAL | 0 refills | Status: AC

## 2024-03-22 NOTE — Progress Notes (Signed)

## 2024-03-29 ENCOUNTER — Encounter: Payer: Self-pay | Admitting: Family Medicine

## 2024-03-29 ENCOUNTER — Ambulatory Visit (INDEPENDENT_AMBULATORY_CARE_PROVIDER_SITE_OTHER): Admitting: Family Medicine

## 2024-03-29 VITALS — BP 136/74 | HR 58 | Ht 64.0 in | Wt 158.8 lb

## 2024-03-29 DIAGNOSIS — Z23 Encounter for immunization: Secondary | ICD-10-CM | POA: Diagnosis not present

## 2024-03-29 DIAGNOSIS — Z1211 Encounter for screening for malignant neoplasm of colon: Secondary | ICD-10-CM

## 2024-03-29 DIAGNOSIS — M542 Cervicalgia: Secondary | ICD-10-CM | POA: Diagnosis not present

## 2024-03-29 NOTE — Patient Instructions (Signed)
 Consider Prevnar 20 at next Office visit

## 2024-03-29 NOTE — Progress Notes (Signed)
 Complete physical exam  Patient: Elizabeth [name]    DOB: 08-15-1960 63 y.o.   MRN: [id number]  Chief Complaint  Patient presents with   Annual Exam    Subjective:    Elizabeth [name] is a 63 y.o. female who presents today for a complete physical exam. She reports consuming a general diet. She generally feels well. She reports sleeping well. She does not have additional problems to discuss today.   Discussed the use of AI scribe software for clinical note transcription with the patient, who gave verbal consent to proceed.  History of Present Illness Elizabeth [name] is a 63 year old female who presents for an annual physical exam.  Trigger thumb and hand pain - Improvement in trigger thumb symptoms - Left thumb remains sore but no longer locks - Soreness in left thumb localized to the thumb itself - Right thumb soreness primarily in the wrist - Uses braces at night and during activities such as driving for stability  Fatigue and decreased energy - Decreased energy levels despite recent week off - Attributes symptoms to aging and possible arthritis  Cervical stiffness and crepitus - Decreased range of motion and crepitus in the neck, especially on the right side - Symptoms associated with a fall in December, landing on elbows with subsequent swelling and bruising of elbows  Musculoskeletal pain and lameness - History of shifting lameness - Suspects possible arthritis  Postoperative status and upcoming procedure - Scheduled for colonoscopy on Monday following recent gallbladder surgery  Back cyst - History of cyst on back previously lanced - No current drainage or issues  Labs are up to date.   Most recent fall risk assessment:    02/02/2024   10:55 AM  Fall Risk   Falls in the past year? 1  Number falls in past yr: 0  Injury with Fall? 0  Risk for fall due to : No Fall Risks  Follow up Falls evaluation completed     Most recent depression  screenings:    02/02/2024   10:55 AM 02/19/2023    9:02 AM  PHQ 2/9 Scores  PHQ - 2 Score 1 0  PHQ- 9 Score 4 1       Patient Care Team: Alvan Dorothyann BIRCH, MD as PCP - General (Family Medicine) Georg Gaskins, MD as Referring Physician (Neurology) Gasper Clarity, NP as Referring Physician (Obstetrics and Gynecology) Sheldon Standing, MD as Consulting Physician (General Surgery)   ROS    Objective:    BP (!) 147/75   Pulse (!) 58   Ht 5' 4 (1.626 m)   Wt 158 lb 12.8 oz (72 kg)   SpO2 99%   BMI 27.26 kg/m    Physical Exam Exam conducted with a chaperone present.  Constitutional:      Appearance: Normal appearance.  HENT:     Head: Normocephalic and atraumatic.     Right Ear: Tympanic membrane, ear canal and external ear normal.     Left Ear: Tympanic membrane, ear canal and external ear normal.     Nose: Nose normal.     Mouth/Throat:     Pharynx: Oropharynx is clear.  Eyes:     Extraocular Movements: Extraocular movements intact.     Conjunctiva/sclera: Conjunctivae normal.     Pupils: Pupils are equal, round, and reactive to light.  Neck:     Thyroid : No thyromegaly.  Cardiovascular:     Rate and Rhythm: Normal rate and regular rhythm.  Pulmonary:  Effort: Pulmonary effort is normal.     Breath sounds: Normal breath sounds.  Chest:     Chest wall: No mass.  Breasts:    Right: Normal. No mass, nipple discharge or skin change.     Left: Normal. No mass, nipple discharge or skin change.  Abdominal:     General: Bowel sounds are normal.     Palpations: Abdomen is soft.     Tenderness: There is no abdominal tenderness.  Musculoskeletal:        General: No swelling.     Cervical back: Neck supple.  Lymphadenopathy:     Upper Body:     Right upper body: No supraclavicular, axillary or pectoral adenopathy.     Left upper body: No supraclavicular, axillary or pectoral adenopathy.  Skin:    General: Skin is warm and dry.     Comments: Varicose veins.     Neurological:     Mental Status: She is oriented to person, place, and time.  Psychiatric:        Mood and Affect: Mood normal.        Behavior: Behavior normal.       No results found for any visits on 03/29/24.       Assessment & Plan:    Routine Health Maintenance and Physical Exam Immunization History  Administered Date(s) Administered   Influenza Inj Mdck Quad Pf 03/16/2019   Influenza Split 02/25/2012   Influenza Whole 03/14/2008, 03/22/2009, 04/04/2010   Influenza, Seasonal, Injecte, Preservative Fre 03/29/2024   Influenza,inj,Quad PF,6+ Mos 02/18/2013   Influenza-Unspecified 04/11/2016, 03/16/2019   PFIZER(Purple Top)SARS-COV-2 Vaccination 07/30/2019, 08/24/2019, 04/29/2020   Pfizer Covid-19 Vaccine Bivalent Booster 71yrs & up 01/25/2021   Td 09/27/2002   Tdap 08/07/2011, 02/13/2022   Zoster Recombinant(Shingrix) 12/07/2022, 03/08/2023   Zoster, Live 05/04/2015    Health Maintenance  Topic Date Due   Pneumococcal Vaccine: 50+ Years (1 of 1 - PCV) Never done   COVID-19 Vaccine (5 - 2025-26 season) 01/26/2024   Colonoscopy  04/05/2024 (Originally 02/24/2024)   Cervical Cancer Screening (HPV/Pap Cotest)  04/26/2025   Mammogram  05/11/2025   DTaP/Tdap/Td (4 - Td or Tdap) 02/14/2032   Influenza Vaccine  Completed   Hepatitis C Screening  Completed   HIV Screening  Completed   Zoster Vaccines- Shingrix  Completed   Hepatitis B Vaccines 19-59 Average Risk  Aged Out   HPV VACCINES  Aged Out   Meningococcal B Vaccine  Aged Out    Discussed health benefits of physical activity, and encouraged her to engage in regular exercise appropriate for her age and condition.  Problem List Items Addressed This Visit   None Visit Diagnoses       Screening for colon cancer    -  Primary     Encounter for immunization       Relevant Orders   Flu vaccine trivalent PF, 6mos and older(Flulaval,Afluria,Fluarix,Fluzone) (Completed)     Cervical pain       Relevant Orders   DG  Cervical Spine Complete       Assessment and Plan Assessment & Plan Osteoarthritis of bilateral thumbs and wrists Chronic osteoarthritis with persistent soreness in left thumb and right wrist. Improvement noted, possibly related to previous viral illness. - Continue wearing braces at night and during activities for stability.  Suspected cervical spine osteoarthritis Suspected cervical spine osteoarthritis with crepitus and decreased range of motion, exacerbated post-fall. Differential includes post-traumatic changes. - Order cervical spine X-ray to evaluate for osteoarthritis  or other structural changes.  Adult Wellness Visit Cardiovascular risk score at 3.5%, below statin therapy threshold. Normal creatinine and liver function. - Continue routine wellness checks. - Monitor cardiovascular risk score annually.  Screening for colon cancer Scheduled colonoscopy for screening. Acknowledged need for current screening post-gallbladder surgery. - Proceed with scheduled colonoscopy.  Immunization management Eligible for Prevnar 20 due to updated guidelines. Previous vaccinations tolerated well. - Administer Prevnar 20 vaccine at her convenience.  Varicose veins of lower extremities Varicose veins present without significant symptoms. Uses compression stockings during flights. - Continue use of compression stockings as needed, especially during flights.    Return in about 1 year (around 03/29/2025).    Dorothyann Byars, MD [facility] at [facility]

## 2024-03-30 ENCOUNTER — Encounter: Payer: Self-pay | Admitting: Pediatrics

## 2024-04-04 NOTE — Progress Notes (Unsigned)
 [facility] History and Physical   Primary Care Physician:  Alvan Dorothyann BIRCH, MD   Reason for Procedure:  Colorectal cancer screening  Plan:    Colonoscopy   The patient was provided an opportunity to ask questions and all were answered. The patient agreed with the plan.   HPI: Elizabeth [name] is a 63 y.o. female undergoing colonoscopy for colorectal cancer screening.  Patient's last colonoscopy was performed in 2015 and was normal.  No family history of colorectal cancer or polyps.  Patient denies current symptoms of rectal bleeding or change in bowel habits.   Past Medical History:  Diagnosis Date   Abdominal pain    s/p laparoscopy cholecystectomy 03/ 2022   Anxiety    Arthritis    Depression    Fibroid, uterine    History of COVID-19 04/2019   per pt moderate symptoms that resolved   RLS (restless legs syndrome)    RLS   Wears glasses     Past Surgical History:  Procedure Laterality Date   COLONOSCOPY  02/23/2014   by dr b. obie   HYSTEROSCOPY WITH D & C     08/ 1999 and 06-17-2002  both @[facility]    INCISIONAL HERNIA REPAIR N/A 03/16/2021   Procedure: HERNIA REPAIR;  Surgeon: Sheldon Standing, MD;  Location: [facility];  Service: General;  Laterality: N/A;   INGUINAL HERNIA REPAIR Bilateral    6 months old   LAPAROSCOPIC CHOLECYSTECTOMY SINGLE SITE WITH INTRAOPERATIVE CHOLANGIOGRAM N/A 08/10/2020   Procedure: LAPAROSCOPIC CHOLECYSTECTOMY SINGLE SITE;  Surgeon: Sheldon Standing, MD;  Location: [facility];  Service: General;  Laterality: N/A;   LAPAROSCOPY N/A 03/16/2021   Procedure: LAPAROSCOPY DIAGNOSTIC;  Surgeon: Sheldon Standing, MD;  Location: [facility];  Service: General;  Laterality: N/A;   LASER ABLATION OF THE CERVIX  1988   TEAR DUCT PROBING  1967   age 24    TONSILLECTOMY AND ADENOIDECTOMY  30   age 8     Prior to Admission medications   Medication Sig Start Date End Date Taking? Authorizing Provider  AMBULATORY  NON FORMULARY MEDICATION Medication Name: Digestion GB    [provider]  AMBULATORY NON FORMULARY MEDICATION Medication Name:  total restore    [provider]  Ascorbic Acid  (VITAMIN C) 1000 MG tablet Take 1 tablet by mouth daily.    [provider]  busPIRone  (BUSPAR ) 15 MG tablet Take 1 tablet (15 mg total) by mouth 2 (two) times daily. 01/12/24   Teresa Rogue A, NP  CAFFEINE PO Take 100 mg by mouth daily.    [provider]  cholecalciferol (VITAMIN D3) 25 MCG (1000 UNIT) tablet Take 1,000 Units by mouth daily.    [provider]  Coenzyme Q10 (CO Q 10) 100 MG CAPS Take 100 mg by mouth daily.    [provider]  Collagen-Vitamin C-Biotin (COLLAGEN 1500/C PO) Take by mouth.    [provider]  ELDERBERRY PO Take 150 mg by mouth daily.    [provider]  EPINEPHrine  (EPIPEN  2-PAK) 0.3 mg/0.3 mL IJ SOAJ injection Inject 0.3 mg into the muscle as needed for anaphylaxis. 10/03/21   Alvan Dorothyann BIRCH, MD  famotidine (PEPCID) 10 MG tablet Take 10 mg by mouth 2 (two) times daily.    [provider]  ferrous sulfate  325 (65 FE) MG tablet Take 325 mg by mouth See admin instructions. Tuesday, Thursday, Saturday and sunday Patient taking differently: Take 325 mg by mouth 3 (  three) times a week. Tuesday, Thursday, Saturday and sunday    [provider]  fexofenadine (ALLEGRA) 180 MG tablet Take 180 mg by mouth every morning. Patient taking differently: Take 180 mg by mouth in the morning and at bedtime.    [provider]  gabapentin  (NEURONTIN ) 100 MG capsule TAKE 2 CAPSULE BY MOUTH AT BEDTIME 01/12/24   Alvan Dorothyann BIRCH, MD  Glucosamine 750 MG TABS Take 1 tablet by mouth 2 (two) times daily.    [provider]  Multiple Vitamin (MULTIVITAMIN) tablet Take 1 tablet by mouth daily.    [provider]  norethindrone -ethinyl estradiol  (FEMHRT LOW DOSE) 0.5-2.5 MG-MCG tablet Take 1  tablet by mouth daily. 02/07/23   [provider]  sertraline  (ZOLOFT ) 100 MG tablet Take 1 tablet (100 mg total) by mouth daily. 01/12/24   Teresa Redell LABOR, NP  vitamin E 180 MG (400 UNITS) capsule Take 400 Units by mouth daily.    [provider]    Current Outpatient Medications  Medication Sig Dispense Refill   AMBULATORY NON FORMULARY MEDICATION Medication Name: Digestion GB     AMBULATORY NON FORMULARY MEDICATION Medication Name:  total restore     Ascorbic Acid  (VITAMIN C) 1000 MG tablet Take 1 tablet by mouth daily.     busPIRone  (BUSPAR ) 15 MG tablet Take 1 tablet (15 mg total) by mouth 2 (two) times daily. 180 tablet 1   CAFFEINE PO Take 100 mg by mouth daily.     cholecalciferol (VITAMIN D3) 25 MCG (1000 UNIT) tablet Take 1,000 Units by mouth daily.     Coenzyme Q10 (CO Q 10) 100 MG CAPS Take 100 mg by mouth daily.     Collagen-Vitamin C-Biotin (COLLAGEN 1500/C PO) Take by mouth.     ELDERBERRY PO Take 150 mg by mouth daily.     famotidine (PEPCID) 10 MG tablet Take 10 mg by mouth 2 (two) times daily.     ferrous sulfate  325 (65 FE) MG tablet Take 325 mg by mouth See admin instructions. Tuesday, Thursday, Saturday and sunday (Patient taking differently: Take 325 mg by mouth 3 (three) times a week. Tuesday, Thursday, Saturday and sunday)     fexofenadine (ALLEGRA) 180 MG tablet Take 180 mg by mouth every morning. (Patient taking differently: Take 180 mg by mouth in the morning and at bedtime.)     gabapentin  (NEURONTIN ) 100 MG capsule TAKE 2 CAPSULE BY MOUTH AT BEDTIME 180 capsule 0   Glucosamine 750 MG TABS Take 1 tablet by mouth 2 (two) times daily.     Multiple Vitamin (MULTIVITAMIN) tablet Take 1 tablet by mouth daily.     norethindrone -ethinyl estradiol  (FEMHRT LOW DOSE) 0.5-2.5 MG-MCG tablet Take 1 tablet by mouth daily.     sertraline  (ZOLOFT ) 100 MG tablet Take 1 tablet (100 mg total) by mouth daily. 90 tablet 1   vitamin E 180 MG (400 UNITS) capsule Take 400  Units by mouth daily.     EPINEPHrine  (EPIPEN  2-PAK) 0.3 mg/0.3 mL IJ SOAJ injection Inject 0.3 mg into the muscle as needed for anaphylaxis. 1 each 1   Current Facility-Administered Medications  Medication Dose Route Frequency Provider Last Rate Last Admin   0.9 %  sodium chloride  infusion  500 mL Intravenous Once Harnoor Kohles M, MD        Allergies as of 04/05/2024 - Review Complete 04/05/2024  Allergen Reaction Noted   Dextromethorphan-guaifenesin  Hives    Fluoxetine hcl Rash    Wellbutrin  [bupropion ] Rash  06/02/2012    Family History  Problem Relation Age of Onset   Heart disease Mother        Septal defect    Anxiety disorder Mother    Hypertension Father    Heart disease Father    Arthritis Father    Diabetes Father    Anxiety disorder Father    Anxiety disorder Sister    Cancer Brother        leiiomyoscarcoma   Anxiety disorder Brother    Hypertension Brother    Cancer Brother        giant cell tumor.    Kidney Stones Brother    Colon cancer Neg Hx    Esophageal cancer Neg Hx    Stomach cancer Neg Hx    Rectal cancer Neg Hx     Social History   Socioeconomic History   Marital status: Divorced    Spouse name: Not on file   Number of children: 0   Years of education: Not on file   Highest education level: Professional school degree (e.g., MD, DDS, DVM, JD)  Occupational History   Occupation: VETERINARIAN    Employer: [employer]  Tobacco Use   Smoking status: Never   Smokeless tobacco: Never  Vaping Use   Vaping status: Never Used  Substance and Sexual Activity   Alcohol use: Yes    Comment: Rarely.    Drug use: Never   Sexual activity: Not on file  Other Topics Concern   Not on file  Social History Narrative      Veterinarian   She is also a pharmacist     Lives alone with three cats.      Social Drivers of Corporate Investment Banker Strain: Low Risk  (03/28/2024)   Overall Financial Resource Strain (CARDIA)    Difficulty of  Paying Living Expenses: Not very hard  Food Insecurity: No Food Insecurity (03/28/2024)   Hunger Vital Sign    Worried About Running Out of Food in the Last Year: Never true    Ran Out of Food in the Last Year: Never true  Transportation Needs: No Transportation Needs (03/28/2024)   PRAPARE - Administrator, Civil Service (Medical): No    Lack of Transportation (Non-Medical): No  Physical Activity: Insufficiently Active (03/28/2024)   Exercise Vital Sign    Days of Exercise per Week: 1 day    Minutes of Exercise per Session: 140 min  Stress: No Stress Concern Present (03/28/2024)   [employer] of Occupational Health - Occupational Stress Questionnaire    Feeling of Stress: Not at all  Social Connections: Unknown (03/28/2024)   Social Connection and Isolation Panel    Frequency of Communication with Friends and Family: Once a week    Frequency of Social Gatherings with Friends and Family: Once a week    Attends Religious Services: 1 to 4 times per year    Active Member of [employer] or Organizations: Patient declined    Attends Engineer, Structural: Not on file    Marital Status: Divorced  Intimate Partner Violence: Not on file    Review of Systems:  All other review of systems negative except as mentioned in the HPI.  Physical Exam: Vital signs BP (!) 151/95   Pulse (!) 56   Temp 98 F (36.7 C) (Temporal)   Ht 5' 4 (1.626 m)   Wt 155 lb (70.3 kg)   SpO2 99%   BMI 26.61 kg/m  General:   Alert,  Well-developed, well-nourished, pleasant and cooperative in NAD Airway:  Mallampati 1 Lungs:  Clear throughout to auscultation.   Heart:  Regular rate and rhythm; no murmurs, clicks, rubs,  or gallops. Abdomen:  Soft, nontender and nondistended. Normal bowel sounds.   Neuro/Psych:  Normal mood and affect. A and O x 3  Inocente Hausen, MD [facility]

## 2024-04-05 ENCOUNTER — Ambulatory Visit: Admitting: Pediatrics

## 2024-04-05 ENCOUNTER — Encounter: Payer: Self-pay | Admitting: Pediatrics

## 2024-04-05 VITALS — BP 139/71 | HR 49 | Temp 98.0°F | Resp 14 | Ht 64.0 in | Wt 155.0 lb

## 2024-04-05 DIAGNOSIS — K649 Unspecified hemorrhoids: Secondary | ICD-10-CM

## 2024-04-05 DIAGNOSIS — K648 Other hemorrhoids: Secondary | ICD-10-CM | POA: Diagnosis not present

## 2024-04-05 DIAGNOSIS — Z1211 Encounter for screening for malignant neoplasm of colon: Secondary | ICD-10-CM

## 2024-04-05 MED ORDER — SODIUM CHLORIDE 0.9 % IV SOLN: 500.0000 mL | Freq: Once | INTRAVENOUS | Status: DC

## 2024-04-05 NOTE — Progress Notes (Signed)
 Report to PACU, RN, vss, BBS= Clear.

## 2024-04-05 NOTE — Op Note (Signed)
 [facility] Patient Name: Elizabeth [name] Procedure Date: 04/05/2024 9:27 AM MRN: [id number] Endoscopist: Inocente Hausen , MD, [id number] Age: 63 Referring MD:  Date of Birth: Jul 17, 1960 Gender: Female Account #: [id number] Procedure:                Colonoscopy Indications:              Screening for colorectal malignant neoplasm, Last                            colonoscopy: 2015 Medicines:                Monitored Anesthesia Care Procedure:                Pre-Anesthesia Assessment:                           - Prior to the procedure, a History and Physical                            was performed, and patient medications and                            allergies were reviewed. The patient's tolerance of                            previous anesthesia was also reviewed. The risks                            and benefits of the procedure and the sedation                            options and risks were discussed with the patient.                            All questions were answered, and informed consent                            was obtained. Prior Anticoagulants: The patient has                            taken no anticoagulant or antiplatelet agents. ASA                            Grade Assessment: II - A patient with mild systemic                            disease. After reviewing the risks and benefits,                            the patient was deemed in satisfactory condition to                            undergo the procedure.  After obtaining informed consent, the colonoscope                            was passed under direct vision. Throughout the                            procedure, the patient's blood pressure, pulse, and                            oxygen saturations were monitored continuously. The                            Olympus Scope SN: [id number] was introduced through                            the anus and advanced to the cecum,  identified by                            appendiceal orifice and ileocecal valve. The                            colonoscopy was performed without difficulty. The                            patient tolerated the procedure well. The quality                            of the bowel preparation was good. The ileocecal                            valve, appendiceal orifice, and rectum were                            photographed. Scope In: 9:32:39 AM Scope Out: 9:47:09 AM Scope Withdrawal Time: 0 hours 10 minutes 31 seconds  Total Procedure Duration: 0 hours 14 minutes 30 seconds  Findings:                 Hemorrhoids were found on perianal exam.                           The digital rectal exam was normal. Pertinent                            negatives include normal sphincter tone and no                            palpable rectal lesions.                           The colon (entire examined portion) appeared normal.                           Internal hemorrhoids were found during retroflexion. Complications:            No  immediate complications. Estimated blood loss:                            None. Impression:               - Hemorrhoids found on perianal exam.                           - The entire examined colon is normal.                           - Internal hemorrhoids.                           - No specimens collected. Recommendation:           - Discharge patient to home (ambulatory).                           - Repeat colonoscopy in 10 years for screening                            purposes.                           - The findings and recommendations were discussed                            with the patient's family.                           - Return to referring physician.                           - Patient has a contact number available for                            emergencies. The signs and symptoms of potential                            delayed complications were  discussed with the                            patient. Return to normal activities tomorrow.                            Written discharge instructions were provided to the                            patient. Inocente Hausen, MD 04/05/2024 9:54:17 AM This report has been signed electronically.

## 2024-04-05 NOTE — Patient Instructions (Signed)

## 2024-04-06 ENCOUNTER — Telehealth: Payer: Self-pay | Admitting: *Deleted

## 2024-04-06 NOTE — Telephone Encounter (Signed)
 No answer for follow up call. Unable to leave a message. Mailbox full.

## 2024-04-12 ENCOUNTER — Ambulatory Visit

## 2024-04-12 DIAGNOSIS — M542 Cervicalgia: Secondary | ICD-10-CM

## 2024-04-14 ENCOUNTER — Ambulatory Visit: Payer: Self-pay | Admitting: Family Medicine

## 2024-04-14 DIAGNOSIS — M542 Cervicalgia: Secondary | ICD-10-CM

## 2024-04-14 NOTE — Progress Notes (Signed)
 Hi Elizabeth [name], x-rays of your neck shows some moderate disc space narrowing at C5-6 and C6-C7.  As well as foramen narrowing on the left side at those 2 levels as well.  Interestingly I know you are having more discomfort on the right side.  I would recommend formal physical therapy for your neck or if you prefer to work with a chiropractor that is fine to.  If you would like a referral for PT please let me know and I will be happy to place that but I do think it could be really helpful in improving pain dysfunction and range of motion and reducing crepitus.

## 2024-04-15 NOTE — Telephone Encounter (Signed)
No orders of the defined types were placed in this encounter. ? ?Orders Placed This Encounter  ?Procedures  ? Ambulatory referral to Physical Therapy  ?  Referral Priority:   Routine  ?  Referral Type:   Physical Medicine  ?  Referral Reason:   Specialty Services Required  ?  Requested Specialty:   Physical Therapy  ?  Number of Visits Requested:   1  ? ? ?

## 2024-04-15 NOTE — Telephone Encounter (Signed)
 Pended referral to PT  Spoke with Miranda at [facility]- she is getting Cervical x-ray CD ready for patient pick up .  Sent patient a Mychart message that she can pick the CD up at her convenience.

## 2024-04-19 ENCOUNTER — Telehealth: Payer: Self-pay | Admitting: Behavioral Health

## 2024-04-19 NOTE — Telephone Encounter (Signed)
 Pt seen in August.  Continue  Zoloft  100 mg daily Continue Buspar  15 mg twice daily  She is asking for an adjustment in Zoloft . She denies SAD, though her father suffered from it. She said she is a grumpy blob. She is irritable even when she is by herself. Is lethargic, lack of motivation. She can get to sleep, doesn't stay asleep, and doesn't want to get up in the morning, hitting the snooze multiple times. Can do chores that need to be done, but not much more than that. She feels like her depression is returning and trying to control it now. She laughs several times during our conversation. She reported going thru insurance information to decide what insurance coverage she needs for next year and that is stressing her some.

## 2024-04-19 NOTE — Telephone Encounter (Signed)
 Pt called at 11:24a stating she thinks she needs a medication adjustment on Zoloft .  Next appt 2/16

## 2024-04-20 NOTE — Telephone Encounter (Signed)
 Sent MyChart msg regarding increase Zoloft  and scheduling appt.

## 2024-04-20 NOTE — Telephone Encounter (Signed)
 It she is ok with it pend increase to Zoloft  150 mg daily please and have her schedule appt for 4 weeks out or right after Christmas since Im out all that week. Thank you.

## 2024-04-26 ENCOUNTER — Ambulatory Visit: Admitting: Professional

## 2024-04-26 ENCOUNTER — Encounter: Payer: Self-pay | Admitting: Professional

## 2024-04-26 DIAGNOSIS — F411 Generalized anxiety disorder: Secondary | ICD-10-CM | POA: Diagnosis not present

## 2024-04-26 DIAGNOSIS — F331 Major depressive disorder, recurrent, moderate: Secondary | ICD-10-CM

## 2024-04-26 NOTE — Progress Notes (Signed)
 [facility] Counselor/Therapist Progress Note  Patient ID: Elizabeth [name], MRN: [id number],    Date: 04/26/2024  Time Spent:  50 minutes 1101-1151am  Treatment Type: Individual Therapy  Risk Assessment: Danger to Self:  No Self-injurious Behavior: No Danger to Others: No  Subjective: This session was held via video teletherapy The patient consented to video teletherapy and was located at her home during this session. She is aware it is the responsibility of the patient to secure confidentiality on her end of the session. The provider was in a private home office for the duration of this session.   The patient arrived on time for her Caregility session.  Issues addressed: 1-Thanksgiving -had a nice visit at [name]'s house 2-noticed an increased in depressive symptoms a-medication -increased Zoloft  dosage form 100mg  to 150mg  a week ago b-symptoms -was having trouble getting out of bed -she was feeling irritated by people even when she was alone -her sleep has improved -had times she was feeling sad and almost wanted to cry -noticed that she could not make herself go to a craft activity despite wanting to go c-stressors -feels stressed trying to deal with her health insurance (she will have doubled payment from last year for same plan) -other plans are 1000-1200/mo and still having to pay copays -holidays generally don't bother her but admits that the way the world is it doesn't make it easier -managing difficult pts -dealing with hacked email -reports trigger was day to day life and reports no family component or historical triggers -moving from a euthanasia to a new puppy visit 3-physical -she had x-rays of neck -left sided C3-C4 having degenerative changes and the C5-C6 she has stenosis with narrowing space -pt reports that she has had two nasty falls once last December and one in HS -she will begin PT in last December -she thinks her career and aging also  contributes -upping her anti-depressant is helping 4-triggers related to marital -did not work on -admits that it was too much to think about 5-professional a-she does not do convenience euthanasia -she will refuse to do those despite what the patient wants -she has been known to take possession of the dog and will get them to a rescue b-lady was curt with her the other day and they worked through her reaction 6-family a-talked with mother on Thanksgiving -her mother was in a good mood and she wonders if it was because a friend was there and if she was on her best behavior -she asked her mother about her niece's wedding and she shared b-mother asked sister to text pt some pictures and she did and sister again apologized for forgetting her invitation c-pt feels she is in a good place with her perspective on family that she cannot change them  2025 Treatment Plan Problems: Social Anxiety, Unipolar Depression Symptoms: History of chronic or recurrent depression for which the client has taken antidepressant medication, been hospitalized, had outpatient treatment, or had a course of electroconvulsive therapy. Poor concentration and indecisiveness. Overall pattern of social anxiety, shyness, or timidity that presents itself in most social situations. Debilitating performance anxiety and/or avoidance of required social performance demands. Avoidance of situations that require a degree of interpersonal contact. Goals: Reach a personal balance between solitary time and interpersonal interaction with others. Interact socially without undue fear or anxiety. Recognize, accept, and cope with feelings of depression. Develop healthy thinking patterns and beliefs about self, others, and the world that lead to the alleviation and help prevent the  relapse of depression. Objectives target date for all objectives is 10/05/2024: Explore past experiences that may be the source of low self-esteem and social  anxiety currently. Work through developmental conflicts that may be influencing current struggles with fear and avoidance and take appropriate actions. Return for a follow-up session to track progress, reinforce gains, and problem-solve barriers. Learn and implement conflict resolution skills to resolve interpersonal problems. Increasingly verbalize hopeful and positive statements regarding self, others, and the future. Interventions: Assign the client to read the books Healing the Shame That Binds You by Harl and Facing Shame by Ren and Jonette, and process key ideas. Probe childhood experiences of criticism, abandonment, or abuse that would foster low self-esteem and shame; process these. Use an insight-oriented approach to explore how psychodynamic conflicts (e.g., separation/autonomy; anger recognition, management, and coping) may be manifesting as social fear and avoidance; address transference; work through separation and anger themes during therapy and upon termination toward developing a new ability to manage separations and autonomy. Schedule a follow-up or booster session for the client for 1 to 3 months after therapy ends to track progress. Teach conflict resolution skills (e.g., empathy, active listening, I messages, respectful communication, assertiveness without aggression, compromise); use psychoeducation, modeling, role-playing, and rehearsal to work through several current conflicts; assign homework exercises; review and repeat so as to integrate their use into the client's life. Assign the client to write at least one positive affirmation statement daily regarding himself/herself and the future (or assign Positive Self-Talk in the Adult Psychotherapy Homework Planner by Jenniffer).  Diagnosis:Generalized anxiety disorder  Major depressive disorder, recurrent episode, moderate (HCC)  Plan:  -symptoms tracking -next session will be Monday, June 13, 2024 at 11am.

## 2024-05-24 ENCOUNTER — Telehealth: Payer: Self-pay | Admitting: Behavioral Health

## 2024-05-24 LAB — HM MAMMOGRAPHY

## 2024-05-24 NOTE — Telephone Encounter (Signed)
 See message f rom patient

## 2024-05-24 NOTE — Telephone Encounter (Signed)
 Patient lvm at 11:39 to give an update on medication. States about a month ago her Zoloft  was increased.Was told to call back after a month to let BW know how she is doing and says she is doing better. PH: [phone] Appt 2/16

## 2024-05-24 NOTE — Telephone Encounter (Signed)
 Noted, thank you.

## 2024-05-25 ENCOUNTER — Encounter: Payer: Self-pay | Admitting: Physical Therapy

## 2024-05-25 ENCOUNTER — Ambulatory Visit

## 2024-05-25 DIAGNOSIS — M542 Cervicalgia: Secondary | ICD-10-CM | POA: Insufficient documentation

## 2024-05-25 DIAGNOSIS — G4486 Cervicogenic headache: Secondary | ICD-10-CM | POA: Diagnosis present

## 2024-05-25 NOTE — Therapy (Signed)
 " OUTPATIENT PHYSICAL THERAPY CERVICAL EVALUATION   Patient Name: Elizabeth [name] MRN: [id number] DOB:06/09/1960, 63 y.o., female Today's Date: 05/25/2024  END OF SESSION:  PT End of Session - 05/25/24 1115     Visit Number 1    Number of Visits 17    Date for Recertification  07/20/24    PT Start Time 1115    PT Stop Time 1156    PT Time Calculation (min) 41 min    Activity Tolerance Patient tolerated treatment well    Behavior During Therapy [facility] for tasks assessed/performed          Past Medical History:  Diagnosis Date   Abdominal pain    s/p laparoscopy cholecystectomy 03/ 2022   Anxiety    Arthritis    Depression    Fibroid, uterine    History of COVID-19 04/2019   per pt moderate symptoms that resolved   RLS (restless legs syndrome)    RLS   Wears glasses    Past Surgical History:  Procedure Laterality Date   COLONOSCOPY  02/23/2014   by dr b. obie   HYSTEROSCOPY WITH D & C     08/ 1999 and 06-17-2002  both @[facility]    INCISIONAL HERNIA REPAIR N/A 03/16/2021   Procedure: HERNIA REPAIR;  Surgeon: Sheldon Standing, MD;  Location: [facility];  Service: General;  Laterality: N/A;   INGUINAL HERNIA REPAIR Bilateral    6 months old   LAPAROSCOPIC CHOLECYSTECTOMY SINGLE SITE WITH INTRAOPERATIVE CHOLANGIOGRAM N/A 08/10/2020   Procedure: LAPAROSCOPIC CHOLECYSTECTOMY SINGLE SITE;  Surgeon: Sheldon Standing, MD;  Location: [facility];  Service: General;  Laterality: N/A;   LAPAROSCOPY N/A 03/16/2021   Procedure: LAPAROSCOPY DIAGNOSTIC;  Surgeon: Sheldon Standing, MD;  Location: [facility];  Service: General;  Laterality: N/A;   LASER ABLATION OF THE CERVIX  1988   TEAR DUCT PROBING  1967   age 63    TONSILLECTOMY AND ADENOIDECTOMY  78   age 78    Patient Active Problem List   Diagnosis Date Noted   Chronic urticaria 02/19/2023   Major depressive disorder, recurrent episode, moderate (HCC) 06/06/2021   Acute on chronic cholecystitis s/p lap  cholecystectomy 08/10/2020 08/09/2020   RLS (restless legs syndrome) 05/15/2011   Vitamin D deficiency 04/04/2010   PARESTHESIA 12/13/2009   MDD (major depressive disorder), severe (HCC) 08/17/2008   INSOMNIA 08/17/2008   Allergic rhinitis 09/16/2007   IMPAIRED FASTING GLUCOSE 09/16/2007   Generalized anxiety disorder 07/15/2007    PCP: Alvan Dorothyann BIRCH, MD   REFERRING PROVIDER: Alvan Dorothyann BIRCH, MD  REFERRING DIAG: M54.2 (ICD-10-CM) - Cervical pain  THERAPY DIAG:  Cervicogenic headache  Neck pain  Rationale for Evaluation and Treatment: Rehabilitation  ONSET DATE: chronic  SUBJECTIVE:  SUBJECTIVE STATEMENT: Patient reports fell last December and has been having neck pain ever since. She reports she uses heat pack every morning and one treatment of acupuncture which seemed to help and increase motion. Pain in her neck is mostly in the R side. Patient reports 2-3 headaches per week which originates from R side of her neck. She has crepitus in her neck which is occasionally painful. She has DeQuervain's in B wrists/thumbs.   Hand dominance: Right  PERTINENT HISTORY:  Per MD note 02/02/2024, patient with polyarticular arthralgia and myalgia. She has pain which sometimes accompanied by a crunchy sensation in the neck. She also has neck tenderness in upper right side. Headaches are typically focused on the upper R side of the neck and associated with neck tenderness.   PAIN:  Are you having pain? Yes: NPRS scale: 3/10 current; 7/10 worst  Pain location: R side of neck Pain description: dull ache, occasional radiating Aggravating factors: sleeping, picking up animals, lifting, pulling  Relieving factors: heat, acupuncture   PRECAUTIONS: None  RED FLAGS: None     WEIGHT  BEARING RESTRICTIONS: No  FALLS:  Has patient fallen in last 6 months? No  LIVING ENVIRONMENT: Lives with: lives alone Lives in: House/apartment  OCCUPATION: Veterinarian and treats animals for rehabilitation   PLOF: Independent  PATIENT GOALS: life management of this issue (do's and don't's)  OBJECTIVE:  Note: Objective measures were completed at Evaluation unless otherwise noted.  DIAGNOSTIC FINDINGS:  EXAM: 6 OR MORE VIEW(S) XRAY OF THE CERVICAL SPINE 04/12/2024 02:11:31 PM  IMPRESSION: 1. No acute abnormality of the cervical spine. 2. Moderate disc space narrowing at C5-C6 and C6-C7. 3. Moderate foraminal stenosis on the left at C5-C6 and C6-C7.  PATIENT SURVEYS:  NDI:  NECK DISABILITY INDEX  Date: 05/25/24 Score  Pain intensity 1 = The pain is very mild at the moment  2. Personal care (washing, dressing, etc.) 0 = I can look after myself normally without causing extra pain  3. Lifting 1 =  I can lift heavy weights but it gives extra pain  4. Reading 1 = I can read as much as I want to with slight pain in my neck  5. Headaches 2 =  I have moderate headaches, which come infrequently  6. Concentration 0 =  I can concentrate fully when I want to with no difficulty  7. Work 0 =  I can do as much work as I want to  8. Driving 1 =  I can drive my car as long as I want with slight pain in my neck  9. Sleeping 1 = My sleep is slightly disturbed (less than 1 hr sleepless)  10. Recreation 2 = I am able to engage in most, but not all of my usual recreation activities because of   pain in my neck  Total 9/50 = 18%   Minimum Detectable Change (90% confidence): 5 points or 10% points  COGNITION: Overall cognitive status: Within functional limits for tasks assessed  SENSATION: None except DeQuervain's in B hands  POSTURE: No Significant postural limitations  PALPATION: Significant tightness and tenderness to B UT, cervical paraspinals, suboccipitals, and levator scapulae     CERVICAL ROM:   Active ROM A/PROM (deg) eval  Flexion WFL  Extension WFL  Right lateral flexion WFL  Left lateral flexion WFL  Right rotation WFL  Left rotation WFL   (Blank rows = not tested)  UPPER EXTREMITY ROM:  Active ROM Right eval Left eval  Shoulder flexion    Shoulder extension    Shoulder abduction    Shoulder adduction    Shoulder extension    Shoulder internal rotation    Shoulder external rotation    Elbow flexion    Elbow extension    Wrist flexion    Wrist extension    Wrist ulnar deviation    Wrist radial deviation    Wrist pronation    Wrist supination     (Blank rows = not tested)  UPPER EXTREMITY MMT:  MMT Right eval Left eval  Shoulder flexion 4+ 4+  Shoulder extension    Shoulder abduction 4+ 4+  Shoulder adduction    Shoulder extension    Shoulder internal rotation    Shoulder external rotation    Middle trapezius    Lower trapezius    Elbow flexion 4+ 4+  Elbow extension 4+ 4+  Grip strength     (Blank rows = not tested)  CERVICAL SPECIAL TESTS:  Spurling's test: Negative and Distraction test: Negative  FUNCTIONAL TESTS:  None   TREATMENT DATE: 05/25/2024    PATIENT EDUCATION:  Education details: HEP, POC, goals  Person educated: Patient Education method: Programmer, Multimedia, Facilities Manager, and Handouts Education comprehension: verbalized understanding, returned demonstration, and needs further education  HOME EXERCISE PROGRAM: Access Code: [email or web address] URL: [email or web address] Date: 05/25/2024 Prepared by: Maryanne Finder  Exercises - Seated Upper Trapezius Stretch  - 2-3 x daily - 5-7 x weekly - 3-5 reps - 30-60 seconds hold - Seated Levator Scapulae Stretch  - 2-3 x daily - 5-7 x weekly - 3-5 reps - 30-60 seconds hold - Supine Suboccipital Release with Tennis Balls  - 1-2 x daily - 5-7 x weekly  ASSESSMENT:  CLINICAL IMPRESSION: Patient is a 63 y.o. female who was seen today for physical therapy  evaluation and treatment for neck pain with associated headaches. Patient demonstrates significant tightness to cervical musculature primarily L upper traps and levator. Patient complaining of ram's head pain pattern with headaches which aligns with muscular tightness found in examination. HEP initiated with upper trap and levator stretching with patient able to return demonstrate appropriate. Patient will benefit from skilled PT to address listed impairments to improve quality of life and reduce pain.   OBJECTIVE IMPAIRMENTS: decreased activity tolerance, decreased endurance, decreased strength, increased muscle spasms, impaired flexibility, postural dysfunction, and pain.   ACTIVITY LIMITATIONS: carrying and lifting  PARTICIPATION LIMITATIONS: cleaning, laundry, community activity, occupation, and yard work  PERSONAL FACTORS: Age, Behavior pattern, Past/current experiences, and Profession are also affecting patient's functional outcome.   REHAB POTENTIAL: Good  CLINICAL DECISION MAKING: Stable/uncomplicated  EVALUATION COMPLEXITY: Low   GOALS: Goals reviewed with patient? Yes  SHORT TERM GOALS: Target date: 06/22/2024  Patient will be independent in HEP to improve strength/mobility for better functional independence with ADLs.  Baseline: 05/25/24: HEP initiated  Goal status: INITIAL   LONG TERM GOALS: Target date: 07/20/2024  Patient will reduce Neck Disability Index score to <10% to demonstrate minimal disability with ADLs including improved sleeping tolerance, sitting tolerance, etc for better mobility at home and work.  Baseline: 05/25/24: 18%  Goal status: INITIAL  2.  Patient will report <2 headaches in 2 week period to demonstrate decrease in muscular tension and ability to tolerate daily activities.   Baseline: 05/25/24: 2-3 headaches/week  Goal status: INITIAL  3.  Patient report worst pain as less than or equal to 3/10 in neck to demonstrate improved tolerance and  muscular tension reduction.  Baseline: 05/25/24: 7/10  Goal status: INITIAL  4.  Patient will demonstrate proper lifting mechanics while lifting 30# kettlebell to simulate work duties of lifting animals.  Baseline: Goal status: INITIAL  PLAN:  PT FREQUENCY: 1-2x/week  PT DURATION: 8 weeks  PLANNED INTERVENTIONS: 97164- PT Re-evaluation, 97750- Physical Performance Testing, 97110-Therapeutic exercises, 97530- Therapeutic activity, 97112- Neuromuscular re-education, 97535- Self Care, 02859- Manual therapy, G0283- Electrical stimulation (unattended), [phone]- Electrical stimulation (manual), 20560 (1-2 muscles), 20561 (3+ muscles)- Dry Needling, Patient/Family education, Joint mobilization, Joint manipulation, Spinal manipulation, Spinal mobilization, Cryotherapy, and Moist heat  PLAN FOR NEXT SESSION: manual STM to cervical musculature, scapular strengthening, stretching    Maryanne Finder, PT, DPT Physical Therapist - [facility] 05/25/2024, 12:42 PM     "

## 2024-05-28 ENCOUNTER — Ambulatory Visit: Payer: Self-pay | Admitting: Family Medicine

## 2024-06-07 ENCOUNTER — Encounter: Payer: Self-pay | Admitting: Physical Therapy

## 2024-06-07 ENCOUNTER — Ambulatory Visit: Payer: Self-pay | Attending: Family Medicine | Admitting: Physical Therapy

## 2024-06-07 VITALS — BP 138/90 | HR 63

## 2024-06-07 DIAGNOSIS — M542 Cervicalgia: Secondary | ICD-10-CM | POA: Insufficient documentation

## 2024-06-07 DIAGNOSIS — G4486 Cervicogenic headache: Secondary | ICD-10-CM | POA: Diagnosis present

## 2024-06-07 NOTE — Therapy (Unsigned)
 " OUTPATIENT PHYSICAL THERAPY TREATMENT   Patient Name: Elizabeth [name] MRN: [id number] DOB:Aug 30, 1960, 64 y.o., female Today's Date: 06/07/2024  END OF SESSION:  PT End of Session - 06/07/24 1525     Visit Number 2    Number of Visits 17    Date for Recertification  07/20/24    Authorization Type [employer] reporting period from 05/25/2024    Progress Note Due on Visit 10    PT Start Time 1526    PT Stop Time 1605    PT Time Calculation (min) 39 min    Activity Tolerance Patient tolerated treatment well    Behavior During Therapy [facility] for tasks assessed/performed           Past Medical History:  Diagnosis Date   Abdominal pain    s/p laparoscopy cholecystectomy 03/ 2022   Anxiety    Arthritis    Depression    Fibroid, uterine    History of COVID-19 04/2019   per pt moderate symptoms that resolved   RLS (restless legs syndrome)    RLS   Wears glasses    Past Surgical History:  Procedure Laterality Date   COLONOSCOPY  02/23/2014   by dr b. obie   HYSTEROSCOPY WITH D & C     08/ 1999 and 06-17-2002  both @[facility]    INCISIONAL HERNIA REPAIR N/A 03/16/2021   Procedure: HERNIA REPAIR;  Surgeon: Sheldon Standing, MD;  [facility]: [facility];  Service: General;  Laterality: N/A;   INGUINAL HERNIA REPAIR Bilateral    6 months old   LAPAROSCOPIC CHOLECYSTECTOMY SINGLE SITE WITH INTRAOPERATIVE CHOLANGIOGRAM N/A 08/10/2020   Procedure: LAPAROSCOPIC CHOLECYSTECTOMY SINGLE SITE;  Surgeon: Sheldon Standing, MD;  Location: [facility];  Service: General;  Laterality: N/A;   LAPAROSCOPY N/A 03/16/2021   Procedure: LAPAROSCOPY DIAGNOSTIC;  Surgeon: Sheldon Standing, MD;  Location: [facility];  Service: General;  Laterality: N/A;   LASER ABLATION OF THE CERVIX  1988   TEAR DUCT PROBING  1967   age 51    TONSILLECTOMY AND ADENOIDECTOMY  41   age 89    Patient Active Problem List   Diagnosis Date Noted   Chronic urticaria 02/19/2023   Major depressive  disorder, recurrent episode, moderate (HCC) 06/06/2021   Acute on chronic cholecystitis s/p lap cholecystectomy 08/10/2020 08/09/2020   RLS (restless legs syndrome) 05/15/2011   Vitamin D deficiency 04/04/2010   PARESTHESIA 12/13/2009   MDD (major depressive disorder), severe (HCC) 08/17/2008   INSOMNIA 08/17/2008   Allergic rhinitis 09/16/2007   IMPAIRED FASTING GLUCOSE 09/16/2007   Generalized anxiety disorder 07/15/2007    PCP: Alvan Dorothyann BIRCH, MD   REFERRING PROVIDER: Alvan Dorothyann BIRCH, MD  REFERRING DIAG: M54.2 (ICD-10-CM) - Cervical pain  THERAPY DIAG:  Cervicogenic headache  Neck pain  Rationale for Evaluation and Treatment: Rehabilitation  ONSET DATE: chronic  SUBJECTIVE:  PERTINENT HISTORY:  Per MD note 02/02/2024, patient with polyarticular arthralgia and myalgia. She has pain which sometimes accompanied by a crunchy sensation in the neck. She also has neck tenderness in upper right side. Headaches are typically focused on the upper R side of the neck and associated with neck tenderness.   SUBJECTIVE STATEMENT: Patient states she feels like the HEP has been helpful and the exercise with the tennis ball is intense. She is continuing to have neck pain.   PAIN:  NPRS: 3/10 right suboccpital region and left mid cervical spine  From initial PT evaluation on 05/25/2024: Yes: NPRS scale: 3/10 current; 7/10 worst  Pain location: R side of neck Pain description: dull ache, occasional radiating Aggravating factors: sleeping, picking up animals, lifting, pulling  Relieving factors: heat, acupuncture   PRECAUTIONS: None  RED FLAGS: None    FALLS:  Has patient fallen in last 6 months? No  OCCUPATION: Veterinarian and treats animals for rehabilitation    PLOF: Independent  PATIENT GOALS: life management of this issue (do's and don't's)  OBJECTIVE:   Vitals:   06/07/24 1528  BP: (!) 138/90  Pulse: 63  SpO2: 97%    SELF-REPORTED FUNCTION THE PATIENT SPECIFIC FUNCTIONAL SCALE Place score of 0-10 (0 = unable to perform activity and 10 = able to perform activity at the same level as before injury or problem) Activity Date: 06/07/24    Sleeping  well  6/10    2. Lifting animals comfortably 6/10    3. Looking up 7/10    4.      Total Score 6.3/10    Total Score = Sum of activity scores/number of activities Minimally Detectable Change: 3 points (for single activity); 2 points (for average score) [employer] (nd). The Patient Specific Functional Scale . Retrieved from [employer]ateo[employer].com.pt  ACCESSORY MOTION CPA along cervical and upper thoracic spine:  Tender and mobile at mid cervical spine, hypomobile at lower cervical spine and thoracic spine.   TREATMENT  Therapeutic exercise: therapeutic exercises that incorporate ONE parameter at one or more areas of the body to centralize symptoms, develop strength and endurance, range of motion, and flexibility required for successful completion of functional activities. Vitals measurement for system's review (see above)  Trial of Hooklying self mobilization with tennis ball peanut with instructions for how to perform and provision of small cinch bag to place tennis balls in to make a peanut at home.   Seated upper cervical spine flexion self-mobilization/suboccipital stretch with one hand on chin and the other assisting from back of head into nod 2x10 with 5 second holds Added to HEP  Seated deep neck flexion isometric with elbows on table and fists blocking flexion at chin, thumbs at superior eye sockets or thumbs at cheek bones 1x10 with 10 second holds  pt prefers blocking with fists Added to HEP  Education on dry  needling, role of thoracic stiffness, seated posture  Manual therapy: to reduce pain and tissue tension, improve range of motion, neuromodulation, in order to promote improved ability to complete functional activities. HOOKLYING STM to B suboccipital muscles, cervical paraspinals, UT and LS  Attempted intermittent manual cervical traction but this increased pain within 5 reps of 5-10 seconds on/off so discontinued.   CPA along cervical and upper thoracic spine:  Tender and mobile at mid cervical spine, hypomobile at lower cervical spine and thoracic spine.   Pt required multimodal cuing for proper technique and to facilitate improved neuromuscular control, strength, range of motion,  and functional ability resulting in improved performance and form.   PATIENT EDUCATION:  Education details: Exercise purpose/form. Self management techniques. Education on HEP including handout. Person educated: Patient Education method: Explanation, Demonstration, and Handouts Education comprehension: verbalized understanding, returned demonstration, and needs further education  HOME EXERCISE PROGRAM: Access Code: [email or web address] URL: [email or web address] Date: 06/07/2024 Prepared by: Camie Cleverly  Exercises - Seated Upper Trapezius Stretch  - 2-3 x daily - 5-7 x weekly - 3-5 reps - 30-60 seconds hold - Seated Levator Scapulae Stretch  - 2-3 x daily - 5-7 x weekly - 3-5 reps - 30-60 seconds hold - Supine Suboccipital Release with Tennis Balls  - 1-2 x daily - 5-7 x weekly - Sub-Occipital Cervical Stretch  - 1-3 x daily - 2 sets - 10 reps - 5 seconds hold - Seated Deep Neck Flexion  - 1 x daily - 3 sets - 10 reps - 5 seconds hold  ASSESSMENT:  CLINICAL IMPRESSION: Patient arrives with continued discomfort but report of good HEP participation and slight improvement in feeling of tension in her neck. She continues to have pain at R > L suboccipital region and L > R at mid cervical spine. Thoracic  spine and lower cervical spine is very stiff and pt would likely benefit from targeted exercises/interventions to address this. She also would benefit from improving anterior neck strength (especially of deep flexors) and decreased tension of the suboccipitals and upper cervical joints. HEP updated today with some exercises to start working on the upper cervical spine and anterior neck strength. Patient reported feeling mildly better by end of session. She may be a good candidate for dry needling in the future if other interventions do not provide sufficient relief on their own, especially at the suboccipital region. Patient would benefit from continued management of limiting condition by skilled physical therapist to address remaining impairments and functional limitations to work towards stated goals and return to PLOF or maximal functional independence.   From initial PT evaluation on 05/25/2024: Patient is a 64 y.o. female who was seen today for physical therapy evaluation and treatment for neck pain with associated headaches. Patient demonstrates significant tightness to cervical musculature primarily L upper traps and levator. Patient complaining of ram's head pain pattern with headaches which aligns with muscular tightness found in examination. HEP initiated with upper trap and levator stretching with patient able to return demonstrate appropriate. Patient will benefit from skilled PT to address listed impairments to improve quality of life and reduce pain.   OBJECTIVE IMPAIRMENTS: decreased activity tolerance, decreased endurance, decreased strength, increased muscle spasms, impaired flexibility, postural dysfunction, and pain.   ACTIVITY LIMITATIONS: carrying and lifting  PARTICIPATION LIMITATIONS: cleaning, laundry, community activity, occupation, and yard work  PERSONAL FACTORS: Age, Behavior pattern, Past/current experiences, and Profession are also affecting patient's functional outcome.    REHAB POTENTIAL: Good  CLINICAL DECISION MAKING: Stable/uncomplicated  EVALUATION COMPLEXITY: Low   GOALS: Goals reviewed with patient? Yes  SHORT TERM GOALS: Target date: 06/22/2024  Patient will be independent in HEP to improve strength/mobility for better functional independence with ADLs.  Baseline: 05/25/24: HEP initiated;  Goal status: In progress   LONG TERM GOALS: Target date: 07/20/2024  Patient will reduce Neck Disability Index score to <10% to demonstrate minimal disability with ADLs including improved sleeping tolerance, sitting tolerance, etc for better mobility at home and work.  Baseline: 05/25/24: 18% ; Goal status: In progress  2.  Patient will report <2 headaches in 2 week period to demonstrate  decrease in muscular tension and ability to tolerate daily activities.   Baseline: 05/25/24: 2-3 headaches/week;  Goal status: In progress  3.  Patient report worst pain as less than or equal to 3/10 in neck to demonstrate improved tolerance and muscular tension reduction.  Baseline: 05/25/24: 7/10;  Goal status: In progress  4.  Patient will demonstrate proper lifting mechanics while lifting 30# kettlebell to simulate work duties of lifting animals.  Baseline: 05/25/24: not tested;  Goal status: In progress  PLAN:  PT FREQUENCY: 1-2x/week  PT DURATION: 8 weeks  PLANNED INTERVENTIONS: 97164- PT Re-evaluation, 97750- Physical Performance Testing, 97110-Therapeutic exercises, 97530- Therapeutic activity, 97112- Neuromuscular re-education, 97535- Self Care, 02859- Manual therapy, G0283- Electrical stimulation (unattended), [phone]- Electrical stimulation (manual), [phone] (1-2 muscles), 20561 (3+ muscles)- Dry Needling, Patient/Family education, Joint mobilization, Joint manipulation, Spinal manipulation, Spinal mobilization, Cryotherapy, and Moist heat  PLAN FOR NEXT SESSION: interventions for thoracic mobility such as self mob with peanut and modified open book,  interventions for upper cervical spine such as self mobilization with strap, assess and review pushing and pulling to decrease compensatory UT use during these motions, consider reviewing lifting mechanics for animals, consider manual STM to cervical musculature, scapular strengthening  Camie SAUNDERS. Juli, PT, DPT, Cert. MDT, PRA-C 06/07/2024, 3:29 PM  [facility] [address] [place], [place] [address] P: [phone] I F: [phone]      "

## 2024-06-08 ENCOUNTER — Encounter: Payer: Self-pay | Admitting: Physical Therapy

## 2024-06-14 ENCOUNTER — Ambulatory Visit: Payer: Self-pay | Admitting: Professional

## 2024-06-14 ENCOUNTER — Ambulatory Visit: Attending: Family Medicine | Admitting: Physical Therapy

## 2024-06-14 ENCOUNTER — Encounter: Payer: Self-pay | Admitting: Physical Therapy

## 2024-06-14 DIAGNOSIS — G4486 Cervicogenic headache: Secondary | ICD-10-CM | POA: Diagnosis present

## 2024-06-14 DIAGNOSIS — M542 Cervicalgia: Secondary | ICD-10-CM | POA: Diagnosis present

## 2024-06-14 NOTE — Therapy (Signed)
 " OUTPATIENT PHYSICAL THERAPY TREATMENT   Patient Name: Elizabeth [name] MRN: [id number] DOB:14-Jul-1960, 64 y.o., female Today's Date: 06/14/2024  END OF SESSION:  PT End of Session - 06/14/24 1454     Visit Number 3    Number of Visits 17    Date for Recertification  07/20/24    Authorization Type [employer] reporting period from 05/25/2024    Progress Note Due on Visit 10    PT Start Time 1438    PT Stop Time 1516    PT Time Calculation (min) 38 min    Activity Tolerance Patient tolerated treatment well    Behavior During Therapy [facility] for tasks assessed/performed            Past Medical History:  Diagnosis Date   Abdominal pain    s/p laparoscopy cholecystectomy 03/ 2022   Anxiety    Arthritis    Depression    Fibroid, uterine    History of COVID-19 04/2019   per pt moderate symptoms that resolved   RLS (restless legs syndrome)    RLS   Wears glasses    Past Surgical History:  Procedure Laterality Date   COLONOSCOPY  02/23/2014   by dr b. obie   HYSTEROSCOPY WITH D & C     08/ 1999 and 06-17-2002  both @[facility]    INCISIONAL HERNIA REPAIR N/A 03/16/2021   Procedure: HERNIA REPAIR;  Surgeon: Sheldon Standing, MD;  Location: [facility];  Service: General;  Laterality: N/A;   INGUINAL HERNIA REPAIR Bilateral    6 months old   LAPAROSCOPIC CHOLECYSTECTOMY SINGLE SITE WITH INTRAOPERATIVE CHOLANGIOGRAM N/A 08/10/2020   Procedure: LAPAROSCOPIC CHOLECYSTECTOMY SINGLE SITE;  Surgeon: Sheldon Standing, MD;  Location: [facility];  Service: General;  Laterality: N/A;   LAPAROSCOPY N/A 03/16/2021   Procedure: LAPAROSCOPY DIAGNOSTIC;  Surgeon: Sheldon Standing, MD;  Location: [facility];  Service: General;  Laterality: N/A;   LASER ABLATION OF THE CERVIX  1988   TEAR DUCT PROBING  1967   age 23    TONSILLECTOMY AND ADENOIDECTOMY  44   age 89    Patient Active Problem List   Diagnosis Date Noted   Chronic urticaria 02/19/2023   Major depressive  disorder, recurrent episode, moderate (HCC) 06/06/2021   Acute on chronic cholecystitis s/p lap cholecystectomy 08/10/2020 08/09/2020   RLS (restless legs syndrome) 05/15/2011   Vitamin D deficiency 04/04/2010   PARESTHESIA 12/13/2009   MDD (major depressive disorder), severe (HCC) 08/17/2008   INSOMNIA 08/17/2008   Allergic rhinitis 09/16/2007   IMPAIRED FASTING GLUCOSE 09/16/2007   Generalized anxiety disorder 07/15/2007    PCP: Alvan Dorothyann BIRCH, MD   REFERRING PROVIDER: Alvan Dorothyann BIRCH, MD  REFERRING DIAG: M54.2 (ICD-10-CM) - Cervical pain  THERAPY DIAG:  Cervicogenic headache  Neck pain  Rationale for Evaluation and Treatment: Rehabilitation  ONSET DATE: chronic  SUBJECTIVE:  PERTINENT HISTORY:  Per MD note 02/02/2024, patient with polyarticular arthralgia and myalgia. She has pain which sometimes accompanied by a crunchy sensation in the neck. She also has neck tenderness in upper right side. Headaches are typically focused on the upper R side of the neck and associated with neck tenderness.   SUBJECTIVE STATEMENT: Patient states she thinks her neck is feeling a little looser but she has noticed it is hard to sidebend left and that she lifts her left shoulder to her ear when trying to stretch the right UT. She was doing some exercises not prescribed to try to help open her chest. She has less pain on the right suboccipital region. She got a neck massager that is lovely.  PAIN:  NPRS: 3/10 left mid cervical spine to UT  From initial PT evaluation on 05/25/2024: Yes: NPRS scale: 3/10 current; 7/10 worst  Pain location: R side of neck Pain description: dull ache, occasional radiating Aggravating factors: sleeping, picking up animals, lifting, pulling  Relieving  factors: heat, acupuncture   PRECAUTIONS: None  RED FLAGS: None    FALLS:  Has patient fallen in last 6 months? No  OCCUPATION: Veterinarian and treats animals for rehabilitation   PLOF: Independent  PATIENT GOALS: life management of this issue (do's and don't's)  OBJECTIVE:    Movement Loss Movement Loss Symptoms  Protrusion min   Flexion mod Increased L UT pain  Retraction min Increased pain at L neck  Extension min Lower mid cervical spine pain  Lateral flexion R mod Ipsilateral tightness at base of neck  Lateral flexion L mod Ipsilateral tightness at base of neck, worse than on R  Rotation R min   Rotation L  min Pain at left base of neck    Repeated Motions Pre-Test Symptoms Test Movement Symptom During Symptom After Mechanical Response Key Functional Test  3/10 Repeated retraction in sitting 110 increases 4-5/10 L neck no effect Increased pain with left rotation  4/10 Rep Retraction in Sitting with clinician OP 1x6 no effect worse No effect Left rotation less painful   Rep Retraction in Sitting with clinician  OP 1x6 increases worse no effect Worse pain with left rotation  4/10 Rep L rotation with self OP no effect better Decreased to right Worse pain with right rotation   Rep upper cervical spine flexion with self OP No effect No effect  No effect No effect   Lower cervical spine symptom localization Same pain with left rotation with and without pre-rotating thoracic spine to the right (suggest lower cervical spine)  TREATMENT  Therapeutic exercise: therapeutic exercises that incorporate ONE parameter at one or more areas of the body to centralize symptoms, develop strength and endurance, range of motion, and flexibility required for successful completion of functional activities.  Cervical spine AROM between other interventions to assess response (see baseline's above)  Repeated motions without clinician overpressure (see above)  Feet elevated  self-mobilization at thoracic spine with LAX ball peanut wrapped in a towel. 1x5 breaths each level from approx T8 to top of thoracic spine Added to HEP and provided LAX balls  Sidelying knees to chest thoracic rotation with cervical spine rotation with hand on chest, actively retracting scapula for active stretch of thoracic spine.  1x10 each side rotating with exhale Added to HEP  Manual therapy: to reduce pain and tissue tension, improve range of motion, neuromodulation, in order to promote improved ability to complete functional activities.  Repeated motions with clinician OP (see above)  Pt  required multimodal cuing for proper technique and to facilitate improved neuromuscular control, strength, range of motion, and functional ability resulting in improved performance and form.   PATIENT EDUCATION:  Education details: Exercise purpose/form. Self management techniques. Education on HEP including handout. Person educated: Patient Education method: Explanation, Demonstration, and Handouts Education comprehension: verbalized understanding, returned demonstration, and needs further education  HOME EXERCISE PROGRAM: Access Code: [email or web address] URL: [email or web address] Date: 06/14/2024 Prepared by: Camie Cleverly  Exercises - Seated Upper Trapezius Stretch  - 2-3 x daily - 5-7 x weekly - 3-5 reps - 30-60 seconds hold - Seated Levator Scapulae Stretch  - 2-3 x daily - 5-7 x weekly - 3-5 reps - 30-60 seconds hold - Supine Suboccipital Release with Tennis Balls  - 1-2 x daily - 5-7 x weekly - Sub-Occipital Cervical Stretch  - 1-3 x daily - 2 sets - 10 reps - 5 seconds hold - Seated Deep Neck Flexion  - 1 x daily - 3 sets - 10 reps - 5 seconds hold - Thoracic Self-Mobilization with Peanut  - 1 x daily - 4 x weekly - 5 reps - Sidelying Open Book  - 1 x daily - 4 x weekly - 1-2 sets - 10 reps - 1 breath hold  ASSESSMENT:  CLINICAL IMPRESSION: Patient arrives with improved  pain at the upper cervical spine but continued discomfort and tightness at the left lower cervical spine now radiating to upper trap. Repeated motions testing did not change this significantly (a little with repeated retraction with clinician OP). Symptom localization testing suggests pain at  lower neck coming from cervical spine instead of thoracic spine. Added interventions for improved thoracic spine mobility to help decrease movement requirement at the cervical spine. Patient tolerated this well and her L UT pain was better by end of session (with slight increase in L mid-cervical spine pain). Patient would benefit from continued management of limiting condition by skilled physical therapist to address remaining impairments and functional limitations to work towards stated goals and return to PLOF or maximal functional independence.   Mechanical sensitivities: L cervical rotation, L cervical sidebending (less so), and cervical flexion (less so).   From initial PT evaluation on 05/25/2024: Patient is a 64 y.o. female who was seen today for physical therapy evaluation and treatment for neck pain with associated headaches. Patient demonstrates significant tightness to cervical musculature primarily L upper traps and levator. Patient complaining of ram's head pain pattern with headaches which aligns with muscular tightness found in examination. HEP initiated with upper trap and levator stretching with patient able to return demonstrate appropriate. Patient will benefit from skilled PT to address listed impairments to improve quality of life and reduce pain.   OBJECTIVE IMPAIRMENTS: decreased activity tolerance, decreased endurance, decreased strength, increased muscle spasms, impaired flexibility, postural dysfunction, and pain.   ACTIVITY LIMITATIONS: carrying and lifting  PARTICIPATION LIMITATIONS: cleaning, laundry, community activity, occupation, and yard work  PERSONAL FACTORS: Age, Behavior  pattern, Past/current experiences, and Profession are also affecting patient's functional outcome.   REHAB POTENTIAL: Good  CLINICAL DECISION MAKING: Stable/uncomplicated  EVALUATION COMPLEXITY: Low   GOALS: Goals reviewed with patient? Yes  SHORT TERM GOALS: Target date: 06/22/2024  Patient will be independent in HEP to improve strength/mobility for better functional independence with ADLs.  Baseline: 05/25/24: HEP initiated;  Goal status: In progress   LONG TERM GOALS: Target date: 07/20/2024  Patient will reduce Neck Disability Index score to <10% to demonstrate minimal disability with ADLs including improved sleeping tolerance,  sitting tolerance, etc for better mobility at home and work.  Baseline: 05/25/24: 18% ; Goal status: In progress  2.  Patient will report <2 headaches in 2 week period to demonstrate decrease in muscular tension and ability to tolerate daily activities.   Baseline: 05/25/24: 2-3 headaches/week;  Goal status: In progress  3.  Patient report worst pain as less than or equal to 3/10 in neck to demonstrate improved tolerance and muscular tension reduction.  Baseline: 05/25/24: 7/10;  Goal status: In progress  4.  Patient will demonstrate proper lifting mechanics while lifting 30# kettlebell to simulate work duties of lifting animals.  Baseline: 05/25/24: not tested;  Goal status: In progress  PLAN:  PT FREQUENCY: 1-2x/week  PT DURATION: 8 weeks  PLANNED INTERVENTIONS: 97164- PT Re-evaluation, 97750- Physical Performance Testing, 97110-Therapeutic exercises, 97530- Therapeutic activity, 97112- Neuromuscular re-education, 97535- Self Care, [id number]- Manual therapy, [id number]- Electrical stimulation (unattended), [phone]- Electrical stimulation (manual), [phone] (1-2 muscles), [id number] (3+ muscles)- Dry Needling, Patient/Family education, Joint mobilization, Joint manipulation, Spinal manipulation, Spinal mobilization, Cryotherapy, and Moist heat  PLAN FOR NEXT  SESSION: check traction alleviation test, consider interventions for upper cervical spine such as self mobilization with strap, assess and review pushing and pulling to decrease compensatory UT use during these motions, consider reviewing lifting mechanics for animals, consider manual STM to cervical musculature, scapular strengthening  Camie SAUNDERS. Juli, PT, DPT, Cert. MDT, PRA-C 06/14/24, 3:50 PM  [facility] [address] [place], [place] [address] P: [phone] I F: [phone]      "

## 2024-06-21 ENCOUNTER — Ambulatory Visit: Admitting: Physical Therapy

## 2024-06-21 ENCOUNTER — Encounter: Payer: Self-pay | Admitting: Professional

## 2024-06-21 ENCOUNTER — Ambulatory Visit: Admitting: Professional

## 2024-06-21 DIAGNOSIS — F411 Generalized anxiety disorder: Secondary | ICD-10-CM | POA: Diagnosis not present

## 2024-06-21 DIAGNOSIS — F331 Major depressive disorder, recurrent, moderate: Secondary | ICD-10-CM | POA: Diagnosis not present

## 2024-06-21 NOTE — Progress Notes (Signed)
 "  [facility] Counselor/Therapist Progress Note  Patient ID: Elizabeth [name], MRN: [id number],    Date: 06/21/2024  Time Spent:  48 minutes 901-949am  Treatment Type: Individual Therapy  Risk Assessment: Danger to Self:  No Self-injurious Behavior: No Danger to Others: No  Subjective: This session was held via video teletherapy The patient consented to video teletherapy and was located at her home during this session. She is aware it is the responsibility of the patient to secure confidentiality on her end of the session. The provider was in a private home office for the duration of this session.   The patient arrived on time for her Caregility session.  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 without breaks -things have slowed down 5-triggers that take her back to marital trauma -being in the location where the former relationships live -Harlene lives in [place] and just hearing anything about [place]    -gf that got everything the one that was living with him   -she got married to someone else and moved out of state    -he did not change the beneficiary and Harlene got it all -Devere lives in [place]   -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   -she was triggered by driving past [place] recently 6-family -talked with mom and she was worried about her and the storm -she texted her mother to let her know things were okay to conserve battery life -pt made  assumption that her siblings do not want to have contact since she did not hear from them at Christmas -she does not want a unified message string -pt doesn't want to much contact  2025 Treatment Plan Problems: Social Anxiety, Unipolar Depression Symptoms: History of chronic or recurrent depression for which the client has taken antidepressant medication, been hospitalized, had outpatient treatment, or had a course of electroconvulsive therapy. Poor concentration and indecisiveness. Overall pattern of social anxiety, shyness, or timidity that presents itself in most social situations. Debilitating performance anxiety and/or avoidance of required social performance demands. Avoidance of situations that require a degree of interpersonal contact. Goals: Reach a personal balance between solitary time and interpersonal interaction with others. Interact socially without undue fear or anxiety. Recognize, accept, and cope with feelings of depression. Develop healthy thinking patterns and beliefs about self, others, and the world that lead to the alleviation and help prevent the relapse of depression. Objectives target date for all objectives is 10/05/2024: Explore past experiences that may be the source of low self-esteem and social anxiety currently. Work through developmental conflicts that may be influencing current struggles with fear and avoidance and take appropriate actions. Return for a follow-up session to track progress, reinforce gains, and problem-solve barriers. Learn and implement conflict resolution skills to resolve interpersonal problems. Increasingly verbalize hopeful and positive statements regarding self, others, and the future. Interventions: Assign the client to read the books Healing the Shame That Binds You by Harl and Facing Shame by Ren and Jonette, and process key ideas. Probe  childhood experiences of criticism, abandonment, or abuse that would foster low self-esteem  and shame; process these. Use an insight-oriented approach to explore how psychodynamic conflicts (e.g., separation/autonomy; anger recognition, management, and coping) may be manifesting as social fear and avoidance; address transference; work through separation and anger themes during therapy and upon termination toward developing a new ability to manage separations and autonomy. Schedule a follow-up or booster session for the client for 1 to 3 months after therapy ends to track progress. Teach conflict resolution skills (e.g., empathy, active listening, I messages, respectful communication, assertiveness without aggression, compromise); use psychoeducation, modeling, role-playing, and rehearsal to work through several current conflicts; assign homework exercises; review and repeat so as to integrate their use into the client's life. Assign the client to write at least one positive affirmation statement daily regarding himself/herself and the future (or assign Positive Self-Talk in the Adult Psychotherapy Homework Planner by Jenniffer).  Diagnosis:Generalized anxiety disorder  Major depressive disorder, recurrent episode, moderate (HCC)  Plan:  -identify triggers related to deceased husband's mistresses -next session begin EMDR refresh -next session will be Monday, July 19, 2024 at 9am. "

## 2024-06-28 ENCOUNTER — Ambulatory Visit: Admitting: Physical Therapy

## 2024-07-05 ENCOUNTER — Ambulatory Visit: Admitting: Physical Therapy

## 2024-07-12 ENCOUNTER — Ambulatory Visit: Admitting: Behavioral Health

## 2024-07-12 ENCOUNTER — Ambulatory Visit: Admitting: Physical Therapy

## 2024-07-19 ENCOUNTER — Ambulatory Visit: Admitting: Professional

## 2024-07-19 ENCOUNTER — Ambulatory Visit: Admitting: Physical Therapy

## 2024-07-26 ENCOUNTER — Ambulatory Visit: Admitting: Physical Therapy

## 2024-08-02 ENCOUNTER — Ambulatory Visit: Admitting: Professional

## 2024-08-02 ENCOUNTER — Ambulatory Visit: Admitting: Physical Therapy

## 2024-08-09 ENCOUNTER — Ambulatory Visit: Admitting: Physical Therapy
